# Patient Record
Sex: Male | Born: 1970 | Race: Asian | Hispanic: No | Marital: Married | Smoking: Former smoker
Health system: Southern US, Community
[De-identification: ages and names within clinical notes are randomized; demographics above are authoritative.]

## PROBLEM LIST (undated history)

## (undated) DIAGNOSIS — R7301 Impaired fasting glucose: Secondary | ICD-10-CM

## (undated) DIAGNOSIS — Z8049 Family history of malignant neoplasm of other genital organs: Secondary | ICD-10-CM

## (undated) DIAGNOSIS — IMO0001 Reserved for inherently not codable concepts without codable children: Secondary | ICD-10-CM

## (undated) DIAGNOSIS — K219 Gastro-esophageal reflux disease without esophagitis: Secondary | ICD-10-CM

## (undated) DIAGNOSIS — Z5189 Encounter for other specified aftercare: Secondary | ICD-10-CM

## (undated) DIAGNOSIS — Z800 Family history of malignant neoplasm of digestive organs: Secondary | ICD-10-CM

## (undated) DIAGNOSIS — E119 Type 2 diabetes mellitus without complications: Secondary | ICD-10-CM

## (undated) DIAGNOSIS — B181 Chronic viral hepatitis B without delta-agent: Secondary | ICD-10-CM

## (undated) DIAGNOSIS — I10 Essential (primary) hypertension: Secondary | ICD-10-CM

## (undated) DIAGNOSIS — C178 Malignant neoplasm of overlapping sites of small intestine: Secondary | ICD-10-CM

## (undated) DIAGNOSIS — R918 Other nonspecific abnormal finding of lung field: Secondary | ICD-10-CM

## (undated) DIAGNOSIS — I509 Heart failure, unspecified: Secondary | ICD-10-CM

## (undated) DIAGNOSIS — D696 Thrombocytopenia, unspecified: Secondary | ICD-10-CM

## (undated) DIAGNOSIS — N1330 Unspecified hydronephrosis: Secondary | ICD-10-CM

## (undated) DIAGNOSIS — IMO0002 Reserved for concepts with insufficient information to code with codable children: Secondary | ICD-10-CM

## (undated) HISTORY — DX: Malignant neoplasm of overlapping sites of small intestine: C17.8

## (undated) HISTORY — DX: Family history of malignant neoplasm of digestive organs: Z80.0

## (undated) HISTORY — DX: Reserved for concepts with insufficient information to code with codable children: IMO0002

## (undated) HISTORY — DX: Type 2 diabetes mellitus without complications: E11.9

## (undated) HISTORY — DX: Impaired fasting glucose: R73.01

## (undated) HISTORY — DX: Essential (primary) hypertension: I10

## (undated) HISTORY — DX: Gastro-esophageal reflux disease without esophagitis: K21.9

## (undated) HISTORY — DX: Chronic viral hepatitis B without delta-agent: B18.1

## (undated) HISTORY — DX: Encounter for other specified aftercare: Z51.89

## (undated) HISTORY — DX: Family history of malignant neoplasm of other genital organs: Z80.49

## (undated) HISTORY — DX: Heart failure, unspecified: I50.9

## (undated) HISTORY — DX: Reserved for inherently not codable concepts without codable children: IMO0001

## (undated) HISTORY — PX: BACK SURGERY: SHX140

## (undated) HISTORY — PX: UPPER GI ENDOSCOPY: SHX6162

---

## 1995-09-08 HISTORY — PX: APPENDECTOMY: SHX54

## 2012-11-30 ENCOUNTER — Other Ambulatory Visit: Payer: Self-pay | Admitting: *Deleted

## 2012-11-30 ENCOUNTER — Other Ambulatory Visit: Payer: No Typology Code available for payment source

## 2012-11-30 ENCOUNTER — Other Ambulatory Visit (INDEPENDENT_AMBULATORY_CARE_PROVIDER_SITE_OTHER): Payer: No Typology Code available for payment source

## 2012-11-30 DIAGNOSIS — E785 Hyperlipidemia, unspecified: Secondary | ICD-10-CM

## 2012-11-30 DIAGNOSIS — R7301 Impaired fasting glucose: Secondary | ICD-10-CM

## 2012-11-30 DIAGNOSIS — R5381 Other malaise: Secondary | ICD-10-CM

## 2012-11-30 DIAGNOSIS — E559 Vitamin D deficiency, unspecified: Secondary | ICD-10-CM

## 2012-11-30 DIAGNOSIS — I10 Essential (primary) hypertension: Secondary | ICD-10-CM

## 2012-11-30 DIAGNOSIS — B181 Chronic viral hepatitis B without delta-agent: Secondary | ICD-10-CM

## 2012-11-30 LAB — COMPLETE METABOLIC PANEL WITH GFR
ALT: 19 U/L (ref 0–53)
AST: 17 U/L (ref 0–37)
Albumin: 4.2 g/dL (ref 3.5–5.2)
Alkaline Phosphatase: 77 U/L (ref 39–117)
BUN: 13 mg/dL (ref 6–23)
CO2: 28 mEq/L (ref 19–32)
Calcium: 9.3 mg/dL (ref 8.4–10.5)
Chloride: 109 mEq/L (ref 96–112)
Creat: 1.14 mg/dL (ref 0.50–1.35)
GFR, Est African American: >89 mL/min
GFR, Est Non African American: 78 mL/min
Glucose, Bld: 88 mg/dL (ref 70–99)
Potassium: 4.3 mEq/L (ref 3.5–5.3)
Sodium: 144 mEq/L (ref 135–145)
Total Bilirubin: 0.9 mg/dL (ref 0.3–1.2)
Total Protein: 6.5 g/dL (ref 6.0–8.3)

## 2012-11-30 LAB — LIPID PANEL
Cholesterol: 136 mg/dL (ref 0–200)
HDL: 41 mg/dL (ref >39)
LDL Cholesterol: 74 mg/dL (ref 0–99)
Total CHOL/HDL Ratio: 3.3 Ratio
Triglycerides: 107 mg/dL (ref <150)
VLDL: 21 mg/dL (ref 0–40)

## 2012-11-30 LAB — TSH: TSH: 1.435 u[IU]/mL (ref 0.350–4.500)

## 2012-12-01 LAB — CBC WITH DIFFERENTIAL/PLATELET
Basophils Absolute: 0 10*3/uL (ref 0.0–0.1)
Basophils Relative: 0 % (ref 0–1)
Eosinophils Absolute: 0.3 10*3/uL (ref 0.0–0.7)
Eosinophils Relative: 5 % (ref 0–5)
HCT: 43.4 % (ref 39.0–52.0)
Hemoglobin: 14.9 g/dL (ref 13.0–17.0)
Lymphocytes Relative: 29 % (ref 12–46)
Lymphs Abs: 1.5 10*3/uL (ref 0.7–4.0)
MCH: 29 pg (ref 26.0–34.0)
MCHC: 34.3 g/dL (ref 30.0–36.0)
MCV: 84.6 fL (ref 78.0–100.0)
Monocytes Absolute: 0.3 10*3/uL (ref 0.1–1.0)
Monocytes Relative: 6 % (ref 3–12)
Neutro Abs: 3.1 10*3/uL (ref 1.7–7.7)
Neutrophils Relative %: 60 % (ref 43–77)
Platelets: 162 10*3/uL (ref 150–400)
RBC: 5.13 MIL/uL (ref 4.22–5.81)
RDW: 14.3 % (ref 11.5–15.5)
WBC: 5.2 10*3/uL (ref 4.0–10.5)

## 2012-12-01 LAB — HEMOGLOBIN A1C
Hgb A1c MFr Bld: 5.6 % (ref <5.7)
Mean Plasma Glucose: 114 mg/dL (ref <117)

## 2012-12-01 LAB — VITAMIN D 25 HYDROXY (VIT D DEFICIENCY, FRACTURES): Vit D, 25-Hydroxy: 11 ng/mL — ABNORMAL LOW (ref 30–89)

## 2012-12-07 ENCOUNTER — Encounter: Payer: Self-pay | Admitting: Family Medicine

## 2012-12-07 ENCOUNTER — Ambulatory Visit (INDEPENDENT_AMBULATORY_CARE_PROVIDER_SITE_OTHER): Payer: No Typology Code available for payment source | Admitting: Family Medicine

## 2012-12-07 VITALS — BP 152/101 | HR 62 | Ht 66.5 in | Wt 147.0 lb

## 2012-12-07 DIAGNOSIS — L723 Sebaceous cyst: Secondary | ICD-10-CM

## 2012-12-07 DIAGNOSIS — K219 Gastro-esophageal reflux disease without esophagitis: Secondary | ICD-10-CM | POA: Insufficient documentation

## 2012-12-07 DIAGNOSIS — Z0000 Encounter for general adult medical examination without abnormal findings: Secondary | ICD-10-CM

## 2012-12-07 DIAGNOSIS — Z23 Encounter for immunization: Secondary | ICD-10-CM

## 2012-12-07 DIAGNOSIS — I10 Essential (primary) hypertension: Secondary | ICD-10-CM | POA: Insufficient documentation

## 2012-12-07 LAB — POCT URINALYSIS DIPSTICK
Bilirubin, UA: NEGATIVE
Blood, UA: NEGATIVE
Glucose, UA: NEGATIVE
Ketones, UA: NEGATIVE
Leukocytes, UA: NEGATIVE
Nitrite, UA: NEGATIVE
Protein, UA: NEGATIVE
Spec Grav, UA: 1.025
Urobilinogen, UA: NEGATIVE
pH, UA: 5
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
## 2012-12-07 NOTE — Patient Instructions (Addendum)
Health Maintenance, Males A healthy lifestyle and preventative care can promote health and wellness.  Maintain regular health, dental, and eye exams.  Eat a healthy diet. Foods like vegetables, fruits, whole grains, low-fat dairy products, and lean protein foods contain the nutrients you need without too many calories. Decrease your intake of foods high in solid fats, added sugars, and salt. Get information about a proper diet from your caregiver, if necessary.  Regular physical exercise is one of the most important things you can do for your health. Most adults should get at least 150 minutes of moderate-intensity exercise (any activity that increases your heart rate and causes you to sweat) each week. In addition, most adults need muscle-strengthening exercises on 2 or more days a week.   Maintain a healthy weight. The body mass index (BMI) is a screening tool to identify possible weight problems. It provides an estimate of body fat based on height and weight. Your caregiver can help determine your BMI, and can help you achieve or maintain a healthy weight. For adults 20 years and older:  A BMI below 18.5 is considered underweight.  A BMI of 18.5 to 24.9 is normal.  A BMI of 25 to 29.9 is considered overweight.  A BMI of 30 and above is considered obese.  Maintain normal blood lipids and cholesterol by exercising and minimizing your intake of saturated fat. Eat a balanced diet with plenty of fruits and vegetables. Blood tests for lipids and cholesterol should begin at age 20 and be repeated every 5 years. If your lipid or cholesterol levels are high, you are over 50, or you are a high risk for heart disease, you may need your cholesterol levels checked more frequently.Ongoing high lipid and cholesterol levels should be treated with medicines, if diet and exercise are not effective.  If you smoke, find out from your caregiver how to quit. If you do not use tobacco, do not start.  If you  choose to drink alcohol, do not exceed 2 drinks per day. One drink is considered to be 12 ounces (355 mL) of beer, 5 ounces (148 mL) of wine, or 1.5 ounces (44 mL) of liquor.  Avoid use of street drugs. Do not share needles with anyone. Ask for help if you need support or instructions about stopping the use of drugs.  High blood pressure causes heart disease and increases the risk of stroke. Blood pressure should be checked at least every 1 to 2 years. Ongoing high blood pressure should be treated with medicines if weight loss and exercise are not effective.  If you are 45 to 44 years old, ask your caregiver if you should take aspirin to prevent heart disease.  Diabetes screening involves taking a blood sample to check your fasting blood sugar level. This should be done once every 3 years, after age 45, if you are within normal weight and without risk factors for diabetes. Testing should be considered at a younger age or be carried out more frequently if you are overweight and have at least 1 risk factor for diabetes.  Colorectal cancer can be detected and often prevented. Most routine colorectal cancer screening begins at the age of 50 and continues through age 75. However, your caregiver may recommend screening at an earlier age if you have risk factors for colon cancer. On a yearly basis, your caregiver may provide home test kits to check for hidden blood in the stool. Use of a small camera at the end of a tube,   to directly examine the colon (sigmoidoscopy or colonoscopy), can detect the earliest forms of colorectal cancer. Talk to your caregiver about this at age 42, when routine screening begins. Direct examination of the colon should be repeated every 5 to 10 years through age 13, unless early forms of pre-cancerous polyps or small growths are found.  Hepatitis C blood testing is recommended for all people born from [birth year] through 1967 and any individual with known risks for hepatitis C.  Healthy  men should no longer receive prostate-specific antigen (PSA) blood tests as part of routine cancer screening. Consult with your caregiver about prostate cancer screening.  Testicular cancer screening is not recommended for adolescents or adult males who have no symptoms. Screening includes self-exam, caregiver exam, and other screening tests. Consult with your caregiver about any symptoms you have or any concerns you have about testicular cancer.  Practice safe sex. Use condoms and avoid high-risk sexual practices to reduce the spread of sexually transmitted infections (STIs).  Use sunscreen with a sun protection factor (SPF) of 30 or greater. Apply sunscreen liberally and repeatedly throughout the day. You should seek shade when your shadow is shorter than you. Protect yourself by wearing long sleeves, pants, a wide-brimmed hat, and sunglasses year round, whenever you are outdoors.  Notify your caregiver of new moles or changes in moles, especially if there is a change in shape or color. Also notify your caregiver if a mole is larger than the size of a pencil eraser.  A one-time screening for abdominal aortic aneurysm (AAA) and surgical repair of large AAAs by sound wave imaging (ultrasonography) is recommended for ages 16 to 20 years who are current or former smokers.  Stay current with your immunizations. Document Released: 02/20/2008 Document Revised: 11/16/2011 Document Reviewed: 01/19/2011 [facility] Patient Information 2013 [place], [place]. Smoking Cessation Quitting smoking is important to your health and has many advantages. However, it is not always easy to quit since nicotine is a very addictive drug. Often times, people try 3 times or more before being able to quit. This document explains the best ways for you to prepare to quit smoking. Quitting takes hard work and a lot of effort, but you can do it. ADVANTAGES OF QUITTING SMOKING  You will live longer, feel better, and live  better.  Your body will feel the impact of quitting smoking almost immediately.  Within 20 minutes, blood pressure decreases. Your pulse returns to its normal level.  After 8 hours, carbon monoxide levels in the blood return to normal. Your oxygen level increases.  After 24 hours, the chance of having a heart attack starts to decrease. Your breath, hair, and body stop smelling like smoke.  After 48 hours, damaged nerve endings begin to recover. Your sense of taste and smell improve.  After 72 hours, the body is virtually free of nicotine. Your bronchial tubes relax and breathing becomes easier.  After 2 to 12 weeks, lungs can hold more air. Exercise becomes easier and circulation improves.  The risk of having a heart attack, stroke, cancer, or lung disease is greatly reduced.  After 1 year, the risk of coronary heart disease is cut in half.  After 5 years, the risk of stroke falls to the same as a nonsmoker.  After 10 years, the risk of lung cancer is cut in half and the risk of other cancers decreases significantly.  After 15 years, the risk of coronary heart disease drops, usually to the level of a nonsmoker.  If you  are pregnant, quitting smoking will improve your chances of having a healthy baby.  The people you live with, especially any children, will be healthier.  You will have extra money to spend on things other than cigarettes. QUESTIONS TO THINK ABOUT BEFORE ATTEMPTING TO QUIT You may want to talk about your answers with your caregiver.  Why do you want to quit?  If you tried to quit in the past, what helped and what did not?  What will be the most difficult situations for you after you quit? How will you plan to handle them?  Who can help you through the tough times? Your family? Friends? A caregiver?  What pleasures do you get from smoking? What ways can you still get pleasure if you quit? Here are some questions to ask your caregiver:  How can you help me to  be successful at quitting?  What medicine do you think would be best for me and how should I take it?  What should I do if I need more help?  What is smoking withdrawal like? How can I get information on withdrawal? GET READY  Set a quit date.  Change your environment by getting rid of all cigarettes, ashtrays, matches, and lighters in your home, car, or work. Do not let people smoke in your home.  Review your past attempts to quit. Think about what worked and what did not. GET SUPPORT AND ENCOURAGEMENT You have a better chance of being successful if you have help. You can get support in many ways.  Tell your family, friends, and co-workers that you are going to quit and need their support. Ask them not to smoke around you.  Get individual, group, or telephone counseling and support. Programs are available at [employer] and health centers. Call your local health department for information about programs in your area.  Spiritual beliefs and practices may help some smokers quit.  Download a "quit meter" on your computer to keep track of quit statistics, such as how long you have gone without smoking, cigarettes not smoked, and money saved.  Get a self-help book about quitting smoking and staying off of tobacco. LEARN NEW SKILLS AND BEHAVIORS  Distract yourself from urges to smoke. Talk to someone, go for a walk, or occupy your time with a task.  Change your normal routine. Take a different route to work. Drink tea instead of coffee. Eat breakfast in a different place.  Reduce your stress. Take a hot bath, exercise, or read a book.  Plan something enjoyable to do every day. Reward yourself for not smoking.  Explore interactive web-based programs that specialize in helping you quit. GET MEDICINE AND USE IT CORRECTLY Medicines can help you stop smoking and decrease the urge to smoke. Combining medicine with the above behavioral methods and support can greatly increase your  chances of successfully quitting smoking.  Nicotine replacement therapy helps deliver nicotine to your body without the negative effects and risks of smoking. Nicotine replacement therapy includes nicotine gum, lozenges, inhalers, nasal sprays, and skin patches. Some may be available over-the-counter and others require a prescription.  Antidepressant medicine helps people abstain from smoking, but how this works is unknown. This medicine is available by prescription.  Nicotinic receptor partial agonist medicine simulates the effect of nicotine in your brain. This medicine is available by prescription. Ask your caregiver for advice about which medicines to use and how to use them based on your health history. Your caregiver will tell you what side  effects to look out for if you choose to be on a medicine or therapy. Carefully read the information on the package. Do not use any other product containing nicotine while using a nicotine replacement product.  RELAPSE OR DIFFICULT SITUATIONS Most relapses occur within the first 3 months after quitting. Do not be discouraged if you start smoking again. Remember, most people try several times before finally quitting. You may have symptoms of withdrawal because your body is used to nicotine. You may crave cigarettes, be irritable, feel very hungry, cough often, get headaches, or have difficulty concentrating. The withdrawal symptoms are only temporary. They are strongest when you first quit, but they will go away within 10 14 days. To reduce the chances of relapse, try to:  Avoid drinking alcohol. Drinking lowers your chances of successfully quitting.  Reduce the amount of caffeine you consume. Once you quit smoking, the amount of caffeine in your body increases and can give you symptoms, such as a rapid heartbeat, sweating, and anxiety.  Avoid smokers because they can make you want to smoke.  Do not let weight gain distract you. Many smokers will gain  weight when they quit, usually less than 10 pounds. Eat a healthy diet and stay active. You can always lose the weight gained after you quit.  Find ways to improve your mood other than smoking. FOR MORE INFORMATION  www.smokefree.gov  Document Released: 08/18/2001 Document Revised: 02/23/2012 Document Reviewed: 12/03/2011 [facility] Patient Information 2013 [place], [place].

## 2012-12-26 ENCOUNTER — Encounter: Payer: Self-pay | Admitting: *Deleted

## 2013-02-15 ENCOUNTER — Other Ambulatory Visit: Payer: Self-pay | Admitting: Gastroenterology

## 2013-02-15 DIAGNOSIS — R109 Unspecified abdominal pain: Secondary | ICD-10-CM

## 2013-02-15 DIAGNOSIS — R634 Abnormal weight loss: Secondary | ICD-10-CM

## 2013-02-20 ENCOUNTER — Ambulatory Visit
Admission: RE | Admit: 2013-02-20 | Discharge: 2013-02-20 | Disposition: A | Discharge status: Home / Self Care | Payer: No Typology Code available for payment source | Source: Ambulatory Visit | Attending: Gastroenterology | Admitting: Gastroenterology

## 2013-02-20 DIAGNOSIS — R634 Abnormal weight loss: Secondary | ICD-10-CM

## 2013-02-20 DIAGNOSIS — R109 Unspecified abdominal pain: Secondary | ICD-10-CM

## 2013-03-09 ENCOUNTER — Encounter: Payer: Self-pay | Admitting: *Deleted

## 2013-03-24 ENCOUNTER — Other Ambulatory Visit: Payer: Self-pay | Admitting: Gastroenterology

## 2013-03-24 DIAGNOSIS — R109 Unspecified abdominal pain: Secondary | ICD-10-CM

## 2013-03-28 ENCOUNTER — Ambulatory Visit
Admission: RE | Admit: 2013-03-28 | Discharge: 2013-03-28 | Disposition: A | Discharge status: Home / Self Care | Payer: No Typology Code available for payment source | Source: Ambulatory Visit | Attending: Gastroenterology | Admitting: Gastroenterology

## 2013-03-28 DIAGNOSIS — R109 Unspecified abdominal pain: Secondary | ICD-10-CM

## 2013-03-28 MED ORDER — IOHEXOL 350 MG/ML SOLN: 100.0000 mL | Freq: Once | INTRAVENOUS | Status: AC | PRN

## 2013-09-07 HISTORY — PX: COLONOSCOPY: SHX174

## 2013-09-19 HISTORY — PX: ABDOMINAL EXPLORATION SURGERY: SHX538

## 2013-09-19 HISTORY — PX: PARTIAL GASTRECTOMY: SHX2172

## 2013-09-19 HISTORY — PX: PANCREATICODUODENECTOMY: SUR1000

## 2013-10-25 ENCOUNTER — Telehealth: Payer: Self-pay | Admitting: Hematology & Oncology

## 2013-10-25 NOTE — Telephone Encounter (Signed)
Left vm w NEW PATIENT today to remind them of their appointment with Dr. Ennever. Also, advised them to bring all meds and insurance information. ° °

## 2013-10-26 ENCOUNTER — Other Ambulatory Visit (HOSPITAL_BASED_OUTPATIENT_CLINIC_OR_DEPARTMENT_OTHER): Payer: No Typology Code available for payment source | Admitting: Lab

## 2013-10-26 ENCOUNTER — Encounter: Payer: Self-pay | Admitting: Hematology & Oncology

## 2013-10-26 ENCOUNTER — Ambulatory Visit: Payer: PRIVATE HEALTH INSURANCE

## 2013-10-26 ENCOUNTER — Ambulatory Visit (HOSPITAL_BASED_OUTPATIENT_CLINIC_OR_DEPARTMENT_OTHER): Payer: No Typology Code available for payment source | Admitting: Hematology & Oncology

## 2013-10-26 VITALS — BP 124/89 | HR 84 | Temp 98.2°F | Resp 18 | Ht 67.0 in | Wt 116.0 lb

## 2013-10-26 DIAGNOSIS — C178 Malignant neoplasm of overlapping sites of small intestine: Secondary | ICD-10-CM

## 2013-10-26 DIAGNOSIS — M79609 Pain in unspecified limb: Secondary | ICD-10-CM

## 2013-10-26 DIAGNOSIS — C170 Malignant neoplasm of duodenum: Secondary | ICD-10-CM

## 2013-10-26 HISTORY — DX: Malignant neoplasm of overlapping sites of small intestine: C17.8

## 2013-10-26 LAB — CBC WITH DIFFERENTIAL (CANCER CENTER ONLY)
BASO#: 0 10*3/uL (ref 0.0–0.2)
BASO%: 0.8 % (ref 0.0–2.0)
EOS%: 2.9 % (ref 0.0–7.0)
Eosinophils Absolute: 0.1 10*3/uL (ref 0.0–0.5)
HEMATOCRIT: 36.6 % — AB (ref 38.7–49.9)
HEMOGLOBIN: 11.6 g/dL — AB (ref 13.0–17.1)
LYMPH#: 1 10*3/uL (ref 0.9–3.3)
LYMPH%: 25.1 % (ref 14.0–48.0)
MCH: 27 pg — ABNORMAL LOW (ref 28.0–33.4)
MCHC: 31.7 g/dL — ABNORMAL LOW (ref 32.0–35.9)
MCV: 85 fL (ref 82–98)
MONO#: 0.3 10*3/uL (ref 0.1–0.9)
MONO%: 7.3 % (ref 0.0–13.0)
NEUT#: 2.4 10*3/uL (ref 1.5–6.5)
NEUT%: 63.9 % (ref 40.0–80.0)
Platelets: 231 10*3/uL (ref 145–400)
RBC: 4.29 10*6/uL (ref 4.20–5.70)
RDW: 14.9 % (ref 11.1–15.7)
WBC: 3.8 10*3/uL — AB (ref 4.0–10.0)

## 2013-10-26 LAB — COMPREHENSIVE METABOLIC PANEL
ALK PHOS: 765 U/L — AB (ref 39–117)
ALT: 263 U/L — ABNORMAL HIGH (ref 0–53)
AST: 178 U/L — AB (ref 0–37)
Albumin: 3.5 g/dL (ref 3.5–5.2)
BILIRUBIN TOTAL: 1.6 mg/dL — AB (ref 0.2–1.2)
BUN: 15 mg/dL (ref 6–23)
CO2: 28 mEq/L (ref 19–32)
Calcium: 8.8 mg/dL (ref 8.4–10.5)
Chloride: 102 mEq/L (ref 96–112)
Creatinine, Ser: 0.76 mg/dL (ref 0.50–1.35)
GLUCOSE: 115 mg/dL — AB (ref 70–99)
Potassium: 4.5 mEq/L (ref 3.5–5.3)
Sodium: 139 mEq/L (ref 135–145)
Total Protein: 5.9 g/dL — ABNORMAL LOW (ref 6.0–8.3)

## 2013-10-26 LAB — LACTATE DEHYDROGENASE: LDH: 184 U/L (ref 94–250)

## 2013-10-26 LAB — PREALBUMIN: Prealbumin: 16.6 mg/dL — ABNORMAL LOW (ref 17.0–34.0)

## 2013-10-26 LAB — CEA: CEA: 2.1 ng/mL (ref 0.0–5.0)

## 2013-10-26 MED ORDER — MEGESTROL ACETATE 625 MG/5ML PO SUSP: 625.0000 mg | Freq: Every day | ORAL | Status: DC

## 2013-10-26 MED ORDER — OXANDROLONE 10 MG PO TABS: ORAL_TABLET | ORAL | Status: DC

## 2013-10-26 MED ORDER — DRONABINOL 2.5 MG PO CAPS: 2.5000 mg | ORAL_CAPSULE | Freq: Two times a day (BID) | ORAL | Status: DC

## 2013-10-26 NOTE — Progress Notes (Signed)
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 He has no past medical history. He has been in good health up until about December. He began to lose weight. His have is no abdominal pain. He was initially evaluated appear in [place].  Actually, back in July, he had some issues. He had a CT scan done.  This showed no obvious pancreatic lesions. There is a mild. Duodenal adenopathy.     Had endoscopes. I'm not sure where he had these. He says these were all negative.  He went down to [place]. He ultimately was taken for laparotomy. Unfortunately, he was found to have a duodenal mass. Initially he was taken for an the possibility of a vagotomy and pyloroplasty. However, once his diet he had a duodenal mass, he was seen by surgical oncology. He had a total resection of the mass. The mass at 4 slighted invaded the pancreas. He essentially had a Whipple type procedure. No obvious metastatic disease was noted.  The pathology report from Keith [name] revealed a adenocarcinoma. There was 3 cm. It was invading the pancreas. He had 9 lymph nodes involved. 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 He was referred back up to our area for adjuvant therapy.  His surgery was on January 13.  He is going to the bathroom okay. He has some abdominal pain. He does not have much of an appetite. There is no vomiting. He has some nausea. This no cough. He does feel weak. There is no bleeding.  Overall, his performance status is ECoG  1.   Chief Complaint  Patient presents with  . NEW PATIENT    Past Medical History  Diagnosis Date  . Chronic hepatitis B   . Impaired fasting glucose   . Hypertension   . GERD (gastroesophageal reflux disease)   . Cancer of small intestine, including duodenum 10/26/2013  :  Past Surgical History  Procedure Laterality Date  . Appendectomy  1997  :  Current  outpatient prescriptions:acetaminophen (TYLENOL) 325 MG tablet, Take 325 mg by mouth as needed., Disp: , Rfl: ;  dronabinol (MARINOL) 2.5 MG capsule, Take 1 capsule (2.5 mg total) by mouth 2 (two) times daily before a meal., Disp: 60 capsule, Rfl: 2;  megestrol (MEGACE ES) 625 MG/5ML suspension, Take 5 mLs (625 mg total) by mouth daily., Disp: 150 mL, Rfl: 0;  oxandrolone (OXANDRIN) 10 MG tablet, Take 1 tablet daily, Disp: 30 tablet, Rfl: 3:  :  No Known Allergies:  Family History  Problem Relation Age of Onset  . Hypertension    . Colon cancer Sister 19  . Esophageal cancer Father 46  . Cancer Mother     Ureter Cancer  :  History   Social History  . Marital Status: Married    Spouse Name: Keith [name]     Number of Children: 2  . Years of Education: 16   Occupational History  . TECHNICAL SUPPORT      [employer]    Social History Main Topics  . Smoking status: Former Smoker -- 0.25 packs/day for 25 years    Types: Cigarettes    Start date: 09/07/1986    Quit date: 09/13/2013  . Smokeless tobacco: Never Used     Comment: quit 4 weeks ago 09-2013  . Alcohol Use: Yes     Comment: He drinks rarely   . Drug Use: No  .  Sexual Activity: Yes    Birth Control/ Protection: Surgical     Comment: Wife had a BTL   Other Topics Concern  . 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  He has smoked at least 20 years.     Alcohol Use:  Rarely    Exercise:  Limited    Hobbies: Music           :  Pertinent items are noted in HPI.  Exam: @[employer] @ [employer] [place]  gentleman in no obvious distress. Head and neck exam shows no ocular or oral lesions. There is no adenopathy in the neck.  Lungs are clear. Cardiac exam regular in rhythm  with no murmurs rubs or bruits. Abdomen is soft. Has a laparotomy scar that is well-healed. There is no fluid wave. There is no palpable hepato- splenomegaly back exam no tenderness over the spine ribs or hips. Extremities some muscle atrophy in upper lower extremities. may be some slight swelling in the left arm. Skin exam slightly dry.  Neurological exam shows no focal neurological deficits.   Recent Labs  10/26/13 1528  WBC 3.8*  HGB 11.6*  HCT 36.6*  PLT 231   No results found for this basename: NA, K, CL, CO2, GLUCOSE, BUN, CREATININE, CALCIUM,  in the last 72 hours  Blood smear review: none  Pathology:as above    Assessment and Plan: [name] is a 43 year old [place] gentleman. He has, clearly, locally advanced adenocarcinoma of the duodenum. This is a unusual tumor. He underwent a very aggressive resection. All tumor was resected as far as I can tell. He had 9 positive lymph nodes.  He has a very high risk of recurrence. Out say that his risk of recurrence without therapy and is easily about 70-80%. Even with aggressive therapy, risk of recurrence is still probably only going to be 25 have a 30%.  Before he got sick, he was doing well. He is very functional.  I think that we will have to do both chemotherapy and radiation therapy.  I would treat him with upfront chemotherapy with FOLFOX followed by low-dose Xeloda with radiation followed by full dose chemotherapy with FOLFOX.  I would use 4 cycles of chemotherapy then go with chemotherapy XRT then follow this with 6 cycles of full dose chemotherapy.  I spent an hour half with [name] in his wife. I explained to him what I thought was needed. I explained to them why I thought he needed aggressive therapy.  I went over the side effects with treatment.  He does have some pain in the left arm. I want to get a Doppler measure there is no thrombus in the left arm.  We will also  set up a baseline CT scan for him.  We will try to start therapy in 1-2 weeks.  I will plan to see him back myself when he starts his second cycle of chemotherapy.

## 2013-10-26 NOTE — Patient Instructions (Addendum)
You Can Quit Smoking If you are ready to quit smoking or are thinking about it, congratulations! You have chosen to help yourself be healthier and live longer! There are lots of different ways to quit smoking. Nicotine gum, nicotine patches, a nicotine inhaler, or nicotine nasal spray can help with physical craving. Hypnosis, support groups, and medicines help break the habit of smoking. TIPS TO GET OFF AND STAY OFF CIGARETTES  Learn to predict your moods. Do not let a bad situation be your excuse to have a cigarette. Some situations in your life might tempt you to have a cigarette.  Ask friends and co-workers not to smoke around you.  Make your home smoke-free.  Never have "just one" cigarette. It leads to wanting another and another. Remind yourself of your decision to quit.  On a card, make a list of your reasons for not smoking. Read it at least the same number of times a day as you have a cigarette. Tell yourself everyday, "I do not want to smoke. I choose not to smoke."  Ask someone at home or work to help you with your plan to quit smoking.  Have something planned after you eat or have a cup of coffee. Take a walk or get other exercise to perk you up. This will help to keep you from overeating.  Try a relaxation exercise to calm you down and decrease your stress. Remember, you may be tense and nervous the first two weeks after you quit. This will pass.  Find new activities to keep your hands busy. Play with a pen, coin, or rubber band. Doodle or draw things on paper.  Brush your teeth right after eating. This will help cut down the craving for the taste of tobacco after meals. You can try mouthwash too.  Try gum, breath mints, or diet candy to keep something in your mouth. IF YOU SMOKE AND WANT TO QUIT:  Do not stock up on cigarettes. Never buy a carton. Wait until one pack is finished before you buy another.  Never carry cigarettes with you at work or at home.  Keep cigarettes  as far away from you as possible. Leave them with someone else.  Never carry matches or a lighter with you.  Ask yourself, "Do I need this cigarette or is this just a reflex?"  Bet with someone that you can quit. Put cigarette money in a piggy bank every morning. If you smoke, you give up the money. If you do not smoke, by the end of the week, you keep the money.  Keep trying. It takes 21 days to change a habit!  Talk to your doctor about using medicines to help you quit. These include nicotine replacement gum, lozenges, or skin patches. Document Released: 06/20/2009 Document Revised: 11/16/2011 Document Reviewed: 06/20/2009 [facility] Patient Information 2014 [place].  Oxaliplatin Injection What is this medicine? OXALIPLATIN (ox AL i PLA tin) is a chemotherapy drug. It targets fast dividing cells, like cancer cells, and causes these cells to die. This medicine is used to treat cancers of the colon and rectum, and many other cancers. This medicine may be used for other purposes; ask your health care provider or pharmacist if you have questions. COMMON BRAND NAME(S): Eloxatin What should I tell my health care provider before I take this medicine? They need to know if you have any of these conditions: -kidney disease -an unusual or allergic reaction to oxaliplatin, other chemotherapy, other medicines, foods, dyes, or preservatives -pregnant or  trying to get pregnant -breast-feeding How should I use this medicine? This drug is given as an infusion into a vein. It is administered in a hospital or clinic by a specially trained health care professional. Talk to your pediatrician regarding the use of this medicine in children. Special care may be needed. Overdosage: If you think you have taken too much of this medicine contact a poison control center or emergency room at once. NOTE: This medicine is only for you. Do not share this medicine with others. What if I miss a dose? It is  important not to miss a dose. Call your doctor or health care professional if you are unable to keep an appointment. What may interact with this medicine? -medicines to increase blood counts like filgrastim, pegfilgrastim, sargramostim -probenecid -some antibiotics like amikacin, gentamicin, neomycin, polymyxin B, streptomycin, tobramycin -zalcitabine Talk to your doctor or health care professional before taking any of these medicines: -acetaminophen -aspirin -ibuprofen -ketoprofen -naproxen This list may not describe all possible interactions. Give your health care provider a list of all the medicines, herbs, non-prescription drugs, or dietary supplements you use. Also tell them if you smoke, drink alcohol, or use illegal drugs. Some items may interact with your medicine. What should I watch for while using this medicine? Your condition will be monitored carefully while you are receiving this medicine. You will need important blood work done while you are taking this medicine. This medicine can make you more sensitive to cold. Do not drink cold drinks or use ice. Cover exposed skin before coming in contact with cold temperatures or cold objects. When out in cold weather wear warm clothing and cover your mouth and nose to warm the air that goes into your lungs. Tell your doctor if you get sensitive to the cold. This drug may make you feel generally unwell. This is not uncommon, as chemotherapy can affect healthy cells as well as cancer cells. Report any side effects. Continue your course of treatment even though you feel ill unless your doctor tells you to stop. In some cases, you may be given additional medicines to help with side effects. Follow all directions for their use. Call your doctor or health care professional for advice if you get a fever, chills or sore throat, or other symptoms of a cold or flu. Do not treat yourself. This drug decreases your body's ability to fight infections. Try to  avoid being around people who are sick. This medicine may increase your risk to bruise or bleed. Call your doctor or health care professional if you notice any unusual bleeding. Be careful brushing and flossing your teeth or using a toothpick because you may get an infection or bleed more easily. If you have any dental work done, tell your dentist you are receiving this medicine. Avoid taking products that contain aspirin, acetaminophen, ibuprofen, naproxen, or ketoprofen unless instructed by your doctor. These medicines may hide a fever. Do not become pregnant while taking this medicine. Women should inform their doctor if they wish to become pregnant or think they might be pregnant. There is a potential for serious side effects to an unborn child. Talk to your health care professional or pharmacist for more information. Do not breast-feed an infant while taking this medicine. Call your doctor or health care professional if you get diarrhea. Do not treat yourself. What side effects may I notice from receiving this medicine? Side effects that you should report to your doctor or health care professional as soon as  possible: -allergic reactions like skin rash, itching or hives, swelling of the face, lips, or tongue -low blood counts - This drug may decrease the number of white blood cells, red blood cells and platelets. You may be at increased risk for infections and bleeding. -signs of infection - fever or chills, cough, sore throat, pain or difficulty passing urine -signs of decreased platelets or bleeding - bruising, pinpoint red spots on the skin, black, tarry stools, nosebleeds -signs of decreased red blood cells - unusually weak or tired, fainting spells, lightheadedness -breathing problems -chest pain, pressure -cough -diarrhea -jaw tightness -mouth sores -nausea and vomiting -pain, swelling, redness or irritation at the injection site -pain, tingling, numbness in the hands or  feet -problems with balance, talking, walking -redness, blistering, peeling or loosening of the skin, including inside the mouth -trouble passing urine or change in the amount of urine Side effects that usually do not require medical attention (report to your doctor or health care professional if they continue or are bothersome): -changes in vision -constipation -hair loss -loss of appetite -metallic taste in the mouth or changes in taste -stomach pain This list may not describe all possible side effects. Call your doctor for medical advice about side effects. You may report side effects to FDA at 1-800-FDA-1088. Where should I keep my medicine? This drug is given in a hospital or clinic and will not be stored at home. NOTE: This sheet is a summary. It may not cover all possible information. If you have questions about this medicine, talk to your doctor, pharmacist, or health care provider.  2014, [employer] Standard. (2008-03-20 17:22:47)   Capecitabine tablets What is this medicine? CAPECITABINE (ka pe SITE a been) is a chemotherapy drug. It slows the growth of cancer cells. This medicine is used to treat breast cancer, and also colon or rectal cancer. This medicine may be used for other purposes; ask your health care provider or pharmacist if you have questions. COMMON BRAND NAME(S): Xeloda What should I tell my health care provider before I take this medicine? They need to know if you have any of these conditions: -bleeding or blood disorders -dihydropyrimidine dehydrogenase (DPD) deficiency -heart disease -infection (especially a virus infection such as chickenpox, cold sores, or herpes) -kidney disease -liver disease -an unusual or allergic reaction to capecitabine, 5-fluorouracil, other medicines, foods, dyes, or preservatives -pregnant or trying to get pregnant -breast-feeding How should I use this medicine? Take this medicine by mouth with a glass of water, within 30  minutes of the end of a meal. Follow the directions on the prescription label. Take your medicine at regular intervals. Do not take it more often than directed. Do not stop taking except on your doctor's advice. Your doctor may want you to take a combination of 150 mg and 500 mg tablets for each dose. It is very important that you know how to correctly take your dose. Taking the wrong tablets could result in an overdose (too much medication) or underdose (too little medication). Talk to your pediatrician regarding the use of this medicine in children. Special care may be needed. Overdosage: If you think you have taken too much of this medicine contact a poison control center or emergency room at once. NOTE: This medicine is only for you. Do not share this medicine with others. What if I miss a dose? If you miss a dose, do not take the missed dose at all. Do not take double or extra doses. Instead, continue with your next  scheduled dose and check with your doctor. What may interact with this medicine? -antacids with aluminum and/or magnesium -folic acid -leucovorin -medicines to increase blood counts like filgrastim, pegfilgrastim, sargramostim -phenytoin -vaccines -warfarin Talk to your doctor or health care professional before taking any of these medicines: -acetaminophen -aspirin -ibuprofen -ketoprofen -naproxen This list may not describe all possible interactions. Give your health care provider a list of all the medicines, herbs, non-prescription drugs, or dietary supplements you use. Also tell them if you smoke, drink alcohol, or use illegal drugs. Some items may interact with your medicine. What should I watch for while using this medicine? Visit your doctor for checks on your progress. This drug may make you feel generally unwell. This is not uncommon, as chemotherapy can affect healthy cells as well as cancer cells. Report any side effects. Continue your course of treatment even though  you feel ill unless your doctor tells you to stop. In some cases, you may be given additional medicines to help with side effects. Follow all directions for their use. Call your doctor or health care professional for advice if you get a fever, chills or sore throat, or other symptoms of a cold or flu. Do not treat yourself. This drug decreases your body's ability to fight infections. Try to avoid being around people who are sick. This medicine may increase your risk to bruise or bleed. Call your doctor or health care professional if you notice any unusual bleeding. Be careful brushing and flossing your teeth or using a toothpick because you may get an infection or bleed more easily. If you have any dental work done, tell your dentist you are receiving this medicine. Avoid taking products that contain aspirin, acetaminophen, ibuprofen, naproxen, or ketoprofen unless instructed by your doctor. These medicines may hide a fever. Do not become pregnant while taking this medicine. Women should inform their doctor if they wish to become pregnant or think they might be pregnant. There is a potential for serious side effects to an unborn child. Talk to your health care professional or pharmacist for more information. Do not breast-feed an infant while taking this medicine. Men are advised not to father a child while taking this medicine. What side effects may I notice from receiving this medicine? Side effects that you should report to your doctor or health care professional as soon as possible: -allergic reactions like skin rash, itching or hives, swelling of the face, lips, or tongue -low blood counts - this medicine may decrease the number of white blood cells, red blood cells and platelets. You may be at increased risk for infections and bleeding. -signs of infection - fever or chills, cough, sore throat, pain or difficulty passing urine -signs of decreased platelets or bleeding - bruising, pinpoint red  spots on the skin, black, tarry stools, blood in the urine -signs of decreased red blood cells - unusually weak or tired, fainting spells, lightheadedness -breathing problems -changes in vision -chest pain -diarrhea of more than 4 bowel movements in one day or any diarrhea at night -mouth sores -nausea and vomiting -pain, swelling, redness at site where injected -pain, tingling, numbness in the hands or feet -redness, swelling, or sores on hands or feet -stomach pain -vomiting -yellow color of skin or eyes Side effects that usually do not require medical attention (report to your doctor or health care professional if they continue or are bothersome): -constipation -diarrhea -dry or itchy skin -hair loss -loss of appetite -nausea -weak or tired This list may  not describe all possible side effects. Call your doctor for medical advice about side effects. You may report side effects to FDA at 1-800-FDA-1088. Where should I keep my medicine? Keep out of the reach of children. Store at room temperature between 15 and 30 degrees C (59 and 86 degrees F). Keep container tightly closed. Throw away any unused medicine after the expiration date. NOTE: This sheet is a summary. It may not cover all possible information. If you have questions about this medicine, talk to your doctor, pharmacist, or health care provider.  2014, [employer]vier/[employer] Standard. (2008-08-06 11:47:41)

## 2013-10-27 ENCOUNTER — Other Ambulatory Visit: Payer: Self-pay | Admitting: *Deleted

## 2013-10-27 ENCOUNTER — Ambulatory Visit (HOSPITAL_BASED_OUTPATIENT_CLINIC_OR_DEPARTMENT_OTHER)
Admission: RE | Admit: 2013-10-27 | Discharge: 2013-10-27 | Disposition: A | Discharge status: Home / Self Care | Payer: PRIVATE HEALTH INSURANCE | Source: Ambulatory Visit | Attending: Hematology & Oncology | Admitting: Hematology & Oncology

## 2013-10-27 ENCOUNTER — Telehealth: Payer: Self-pay | Admitting: Hematology & Oncology

## 2013-10-27 DIAGNOSIS — C178 Malignant neoplasm of overlapping sites of small intestine: Secondary | ICD-10-CM

## 2013-10-27 DIAGNOSIS — M79609 Pain in unspecified limb: Secondary | ICD-10-CM | POA: Insufficient documentation

## 2013-10-27 DIAGNOSIS — C170 Malignant neoplasm of duodenum: Secondary | ICD-10-CM

## 2013-10-27 DIAGNOSIS — Z8711 Personal history of peptic ulcer disease: Secondary | ICD-10-CM | POA: Insufficient documentation

## 2013-10-27 MED ORDER — CAPECITABINE 500 MG PO TABS: 1000.0000 mg/m2 | ORAL_TABLET | Freq: Two times a day (BID) | ORAL | Status: DC

## 2013-10-27 MED ORDER — ONDANSETRON HCL 8 MG PO TABS: 8.0000 mg | ORAL_TABLET | Freq: Two times a day (BID) | ORAL | Status: DC

## 2013-10-27 MED ORDER — DEXAMETHASONE 4 MG PO TABS: 8.0000 mg | ORAL_TABLET | Freq: Two times a day (BID) | ORAL | Status: DC

## 2013-10-27 MED ORDER — PROCHLORPERAZINE MALEATE 10 MG PO TABS: 10.0000 mg | ORAL_TABLET | Freq: Four times a day (QID) | ORAL | Status: DC | PRN

## 2013-10-27 MED ORDER — LORAZEPAM 0.5 MG PO TABS: 0.5000 mg | ORAL_TABLET | Freq: Four times a day (QID) | ORAL | Status: DC | PRN

## 2013-10-27 NOTE — Telephone Encounter (Signed)
Orders released to [facility] to determine how much Xeloda will be for patient

## 2013-10-27 NOTE — Telephone Encounter (Signed)
Patient needs to use [name] according to [facility] due to insurance.  Phone # is [phone] and fax is [phone].  Information faxed to them to get prescription started

## 2013-10-27 NOTE — Telephone Encounter (Signed)
Pt aware of 2-24 CT and to come up here for schedule today

## 2013-10-30 ENCOUNTER — Telehealth: Payer: Self-pay | Admitting: Nurse Practitioner

## 2013-10-30 NOTE — Telephone Encounter (Addendum)
Message copied by Jimmy Footman on Mon Oct 30, 2013  9:34 AM ------      Message from: Burney Gauze R      Created: Sun Oct 29, 2013  9:15 AM       Call - no blood clot in left arm!  Pete ------LVM on pt's personal machine and instructed him to do not hesitate to contact our office if he had any further questions or concerns.

## 2013-10-31 ENCOUNTER — Encounter (HOSPITAL_BASED_OUTPATIENT_CLINIC_OR_DEPARTMENT_OTHER): Payer: Self-pay

## 2013-10-31 ENCOUNTER — Ambulatory Visit (HOSPITAL_BASED_OUTPATIENT_CLINIC_OR_DEPARTMENT_OTHER)
Admission: RE | Admit: 2013-10-31 | Discharge: 2013-10-31 | Disposition: A | Discharge status: Home / Self Care | Payer: PRIVATE HEALTH INSURANCE | Source: Ambulatory Visit | Attending: Hematology & Oncology | Admitting: Hematology & Oncology

## 2013-10-31 DIAGNOSIS — C178 Malignant neoplasm of overlapping sites of small intestine: Secondary | ICD-10-CM

## 2013-10-31 DIAGNOSIS — C170 Malignant neoplasm of duodenum: Secondary | ICD-10-CM

## 2013-10-31 DIAGNOSIS — Z8509 Personal history of malignant neoplasm of other digestive organs: Secondary | ICD-10-CM | POA: Insufficient documentation

## 2013-10-31 MED ORDER — IOHEXOL 300 MG/ML  SOLN
100.0000 mL | Freq: Once | INTRAMUSCULAR | Status: AC | PRN
  Administered 2013-10-31: 100 mL via INTRAVENOUS

## 2013-11-01 ENCOUNTER — Telehealth: Payer: Self-pay | Admitting: Hematology & Oncology

## 2013-11-01 ENCOUNTER — Other Ambulatory Visit: Payer: PRIVATE HEALTH INSURANCE

## 2013-11-01 NOTE — Telephone Encounter (Signed)
[place] w pt's [employer] crew for outpt chemo tx, per [employer].  oxaliplatin (ELOXATIN) 205 mg - [id number]   P: [phone] x [phone] F: [phone]

## 2013-11-02 ENCOUNTER — Other Ambulatory Visit: Payer: No Typology Code available for payment source

## 2013-11-06 ENCOUNTER — Other Ambulatory Visit (HOSPITAL_BASED_OUTPATIENT_CLINIC_OR_DEPARTMENT_OTHER): Payer: PRIVATE HEALTH INSURANCE | Admitting: Lab

## 2013-11-06 ENCOUNTER — Ambulatory Visit (HOSPITAL_BASED_OUTPATIENT_CLINIC_OR_DEPARTMENT_OTHER): Payer: No Typology Code available for payment source

## 2013-11-06 VITALS — BP 107/65 | HR 68 | Temp 97.4°F | Resp 18

## 2013-11-06 DIAGNOSIS — C178 Malignant neoplasm of overlapping sites of small intestine: Secondary | ICD-10-CM

## 2013-11-06 DIAGNOSIS — Z5111 Encounter for antineoplastic chemotherapy: Secondary | ICD-10-CM

## 2013-11-06 DIAGNOSIS — C170 Malignant neoplasm of duodenum: Secondary | ICD-10-CM

## 2013-11-06 LAB — CBC WITH DIFFERENTIAL (CANCER CENTER ONLY)
BASO#: 0 10*3/uL (ref 0.0–0.2)
BASO%: 0.2 % (ref 0.0–2.0)
EOS ABS: 0.1 10*3/uL (ref 0.0–0.5)
EOS%: 2.5 % (ref 0.0–7.0)
HCT: 32.5 % — ABNORMAL LOW (ref 38.7–49.9)
HEMOGLOBIN: 10.5 g/dL — AB (ref 13.0–17.1)
LYMPH#: 1.8 10*3/uL (ref 0.9–3.3)
LYMPH%: 31.3 % (ref 14.0–48.0)
MCH: 27 pg — ABNORMAL LOW (ref 28.0–33.4)
MCHC: 32.3 g/dL (ref 32.0–35.9)
MCV: 84 fL (ref 82–98)
MONO#: 0.5 10*3/uL (ref 0.1–0.9)
MONO%: 9.6 % (ref 0.0–13.0)
NEUT%: 56.4 % (ref 40.0–80.0)
NEUTROS ABS: 3.2 10*3/uL (ref 1.5–6.5)
Platelets: 204 10*3/uL (ref 145–400)
RBC: 3.89 10*6/uL — AB (ref 4.20–5.70)
RDW: 14.5 % (ref 11.1–15.7)
WBC: 5.6 10*3/uL (ref 4.0–10.0)

## 2013-11-06 LAB — CMP (CANCER CENTER ONLY)
ALBUMIN: 3 g/dL — AB (ref 3.3–5.5)
ALT(SGPT): 44 U/L (ref 10–47)
AST: 24 U/L (ref 11–38)
Alkaline Phosphatase: 261 U/L — ABNORMAL HIGH (ref 26–84)
BUN: 16 mg/dL (ref 7–22)
CALCIUM: 9 mg/dL (ref 8.0–10.3)
CHLORIDE: 107 meq/L (ref 98–108)
CO2: 29 mEq/L (ref 18–33)
Creat: 1 mg/dl (ref 0.6–1.2)
GLUCOSE: 143 mg/dL — AB (ref 73–118)
POTASSIUM: 4.3 meq/L (ref 3.3–4.7)
Sodium: 141 mEq/L (ref 128–145)
Total Bilirubin: 0.6 mg/dl (ref 0.20–1.60)
Total Protein: 6.4 g/dL (ref 6.4–8.1)

## 2013-11-06 MED ORDER — DEXAMETHASONE SODIUM PHOSPHATE 10 MG/ML IJ SOLN
INTRAMUSCULAR | Status: AC
  Filled 2013-11-06: qty 1

## 2013-11-06 MED ORDER — DEXAMETHASONE SODIUM PHOSPHATE 10 MG/ML IJ SOLN
10.0000 mg | Freq: Once | INTRAMUSCULAR | Status: AC
  Administered 2013-11-06: 10 mg via INTRAVENOUS

## 2013-11-06 MED ORDER — DEXTROSE 5 % IV SOLN
Freq: Once | INTRAVENOUS | Status: AC
  Administered 2013-11-06: 10:00:00 via INTRAVENOUS

## 2013-11-06 MED ORDER — DEXTROSE 5 % IV SOLN
125.0000 mg/m2 | Freq: Once | INTRAVENOUS | Status: AC
  Administered 2013-11-06: 200 mg via INTRAVENOUS
  Filled 2013-11-06: qty 40

## 2013-11-06 MED ORDER — ONDANSETRON 8 MG/50ML IVPB (CHCC)
8.0000 mg | Freq: Once | INTRAVENOUS | Status: AC
  Administered 2013-11-06: 8 mg via INTRAVENOUS

## 2013-11-06 NOTE — Patient Instructions (Signed)
[place] Discharge Instructions for Patients Receiving Chemotherapy  Today you received the following chemotherapy agents  Oxaliplatin  To help prevent nausea and vomiting after your treatment, we encourage you to take your nausea medication as prescribed    If you develop nausea and vomiting that is not controlled by your nausea medication, call the clinic. If it is after clinic hours your family physician or the after hours number for the clinic or go to the Emergency Department.   BELOW ARE SYMPTOMS THAT SHOULD BE REPORTED IMMEDIATELY:  *FEVER GREATER THAN 100.5 F  *CHILLS WITH OR WITHOUT FEVER  NAUSEA AND VOMITING THAT IS NOT CONTROLLED WITH YOUR NAUSEA MEDICATION  *UNUSUAL SHORTNESS OF BREATH  *UNUSUAL BRUISING OR BLEEDING  TENDERNESS IN MOUTH AND THROAT WITH OR WITHOUT PRESENCE OF ULCERS  *URINARY PROBLEMS  *BOWEL PROBLEMS  UNUSUAL RASH Items with * indicate a potential emergency and should be followed up as soon as possible.  One of the nurses will contact you 24 hours after your treatment. Please let the nurse know about any problems that you may have experienced. Feel free to call the clinic you have any questions or concerns. The clinic phone number is [phone].   I have been informed and understand all the instructions given to me. I know to contact the clinic, my physician, or go to the Emergency Department if any problems should occur. I do not have any questions at this time, but understand that I may call the clinic during office hours   should I have any questions or need assistance in obtaining follow up care.    __________________________________________  _____________  __________ Signature of Patient or Authorized Representative            Date                   Time    __________________________________________ Nurse's Signature

## 2013-11-08 ENCOUNTER — Telehealth: Payer: Self-pay | Admitting: Hematology & Oncology

## 2013-11-08 ENCOUNTER — Encounter: Payer: Self-pay | Admitting: Hematology & Oncology

## 2013-11-08 NOTE — Telephone Encounter (Signed)
Melody Enos Fling, RN, CCM - from [facility] sent a fax form requesting chemo assessment plan on this patient.   Form completed and faxed today to: [phone]  P: [phone] [id number]    COPY SCANNED

## 2013-11-10 ENCOUNTER — Encounter: Payer: Self-pay | Admitting: Hematology & Oncology

## 2013-11-14 ENCOUNTER — Encounter: Payer: Self-pay | Admitting: Hematology & Oncology

## 2013-11-27 ENCOUNTER — Encounter: Payer: Self-pay | Admitting: Hematology & Oncology

## 2013-11-27 ENCOUNTER — Other Ambulatory Visit (HOSPITAL_BASED_OUTPATIENT_CLINIC_OR_DEPARTMENT_OTHER): Payer: PRIVATE HEALTH INSURANCE | Admitting: Lab

## 2013-11-27 ENCOUNTER — Ambulatory Visit (HOSPITAL_BASED_OUTPATIENT_CLINIC_OR_DEPARTMENT_OTHER): Payer: PRIVATE HEALTH INSURANCE

## 2013-11-27 ENCOUNTER — Ambulatory Visit (HOSPITAL_BASED_OUTPATIENT_CLINIC_OR_DEPARTMENT_OTHER): Payer: PRIVATE HEALTH INSURANCE | Admitting: Hematology & Oncology

## 2013-11-27 VITALS — BP 104/70 | HR 72 | Temp 97.8°F | Resp 18 | Ht 67.0 in | Wt 123.0 lb

## 2013-11-27 DIAGNOSIS — C170 Malignant neoplasm of duodenum: Secondary | ICD-10-CM

## 2013-11-27 DIAGNOSIS — C178 Malignant neoplasm of overlapping sites of small intestine: Secondary | ICD-10-CM

## 2013-11-27 DIAGNOSIS — C779 Secondary and unspecified malignant neoplasm of lymph node, unspecified: Secondary | ICD-10-CM

## 2013-11-27 DIAGNOSIS — Z5111 Encounter for antineoplastic chemotherapy: Secondary | ICD-10-CM

## 2013-11-27 LAB — CMP (CANCER CENTER ONLY)
ALT(SGPT): 16 U/L (ref 10–47)
AST: 26 U/L (ref 11–38)
Albumin: 2.8 g/dL — ABNORMAL LOW (ref 3.3–5.5)
Alkaline Phosphatase: 96 U/L — ABNORMAL HIGH (ref 26–84)
BUN, Bld: 16 mg/dL (ref 7–22)
CO2: 25 mEq/L (ref 18–33)
Calcium: 8.2 mg/dL (ref 8.0–10.3)
Chloride: 110 mEq/L — ABNORMAL HIGH (ref 98–108)
Creat: 0.8 mg/dl (ref 0.6–1.2)
Glucose, Bld: 129 mg/dL — ABNORMAL HIGH (ref 73–118)
Potassium: 3.7 mEq/L (ref 3.3–4.7)
Sodium: 138 mEq/L (ref 128–145)
Total Bilirubin: 1 mg/dl (ref 0.20–1.60)
Total Protein: 5.4 g/dL — ABNORMAL LOW (ref 6.4–8.1)

## 2013-11-27 LAB — CBC WITH DIFFERENTIAL (CANCER CENTER ONLY)
BASO#: 0 10*3/uL (ref 0.0–0.2)
BASO%: 0.5 % (ref 0.0–2.0)
EOS%: 2.3 % (ref 0.0–7.0)
Eosinophils Absolute: 0.1 10*3/uL (ref 0.0–0.5)
HEMATOCRIT: 31.8 % — AB (ref 38.7–49.9)
HGB: 10.6 g/dL — ABNORMAL LOW (ref 13.0–17.1)
LYMPH#: 1.3 10*3/uL (ref 0.9–3.3)
LYMPH%: 32.6 % (ref 14.0–48.0)
MCH: 28.2 pg (ref 28.0–33.4)
MCHC: 33.3 g/dL (ref 32.0–35.9)
MCV: 85 fL (ref 82–98)
MONO#: 0.3 10*3/uL (ref 0.1–0.9)
MONO%: 8 % (ref 0.0–13.0)
NEUT#: 2.2 10*3/uL (ref 1.5–6.5)
NEUT%: 56.6 % (ref 40.0–80.0)
PLATELETS: 122 10*3/uL — AB (ref 145–400)
RBC: 3.76 10*6/uL — ABNORMAL LOW (ref 4.20–5.70)
RDW: 17.4 % — ABNORMAL HIGH (ref 11.1–15.7)
WBC: 3.9 10*3/uL — ABNORMAL LOW (ref 4.0–10.0)

## 2013-11-27 LAB — TECHNOLOGIST REVIEW CHCC SATELLITE

## 2013-11-27 MED ORDER — DEXAMETHASONE SODIUM PHOSPHATE 10 MG/ML IJ SOLN
10.0000 mg | Freq: Once | INTRAMUSCULAR | Status: AC
  Administered 2013-11-27: 10 mg via INTRAVENOUS

## 2013-11-27 MED ORDER — SODIUM CHLORIDE 0.9 % IJ SOLN
10.0000 mL | INTRAMUSCULAR | Status: DC | PRN
  Filled 2013-11-27: qty 10

## 2013-11-27 MED ORDER — PANCRELIPASE (LIP-PROT-AMYL) 36000-114000 UNITS PO CPEP: 1.0000 | ORAL_CAPSULE | Freq: Three times a day (TID) | ORAL | Status: DC

## 2013-11-27 MED ORDER — OXALIPLATIN CHEMO INJECTION 100 MG/20ML
125.0000 mg/m2 | Freq: Once | INTRAVENOUS | Status: AC
  Administered 2013-11-27: 200 mg via INTRAVENOUS
  Filled 2013-11-27: qty 40

## 2013-11-27 MED ORDER — DEXTROSE 5 % IV SOLN
Freq: Once | INTRAVENOUS | Status: AC
  Administered 2013-11-27: 12:00:00 via INTRAVENOUS

## 2013-11-27 MED ORDER — ONDANSETRON 8 MG/50ML IVPB (CHCC)
8.0000 mg | Freq: Once | INTRAVENOUS | Status: AC
  Administered 2013-11-27: 8 mg via INTRAVENOUS

## 2013-11-27 MED ORDER — DEXAMETHASONE SODIUM PHOSPHATE 10 MG/ML IJ SOLN
INTRAMUSCULAR | Status: AC
  Filled 2013-11-27: qty 1

## 2013-11-27 NOTE — Patient Instructions (Signed)
Oxaliplatin Injection What is this medicine? OXALIPLATIN (ox AL i PLA tin) is a chemotherapy drug. It targets fast dividing cells, like cancer cells, and causes these cells to die. This medicine is used to treat cancers of the colon and rectum, and many other cancers. This medicine may be used for other purposes; ask your health care provider or pharmacist if you have questions. COMMON BRAND NAME(S): Eloxatin What should I tell my health care provider before I take this medicine? They need to know if you have any of these conditions: -kidney disease -an unusual or allergic reaction to oxaliplatin, other chemotherapy, other medicines, foods, dyes, or preservatives -pregnant or trying to get pregnant -breast-feeding How should I use this medicine? This drug is given as an infusion into a vein. It is administered in a hospital or clinic by a specially trained health care professional. Talk to your pediatrician regarding the use of this medicine in children. Special care may be needed. Overdosage: If you think you have taken too much of this medicine contact a poison control center or emergency room at once. NOTE: This medicine is only for you. Do not share this medicine with others. What if I miss a dose? It is important not to miss a dose. Call your doctor or health care professional if you are unable to keep an appointment. What may interact with this medicine? -medicines to increase blood counts like filgrastim, pegfilgrastim, sargramostim -probenecid -some antibiotics like amikacin, gentamicin, neomycin, polymyxin B, streptomycin, tobramycin -zalcitabine Talk to your doctor or health care professional before taking any of these medicines: -acetaminophen -aspirin -ibuprofen -ketoprofen -naproxen This list may not describe all possible interactions. Give your health care provider a list of all the medicines, herbs, non-prescription drugs, or dietary supplements you use. Also tell them if  you smoke, drink alcohol, or use illegal drugs. Some items may interact with your medicine. What should I watch for while using this medicine? Your condition will be monitored carefully while you are receiving this medicine. You will need important blood work done while you are taking this medicine. This medicine can make you more sensitive to cold. Do not drink cold drinks or use ice. Cover exposed skin before coming in contact with cold temperatures or cold objects. When out in cold weather wear warm clothing and cover your mouth and nose to warm the air that goes into your lungs. Tell your doctor if you get sensitive to the cold. This drug may make you feel generally unwell. This is not uncommon, as chemotherapy can affect healthy cells as well as cancer cells. Report any side effects. Continue your course of treatment even though you feel ill unless your doctor tells you to stop. In some cases, you may be given additional medicines to help with side effects. Follow all directions for their use. Call your doctor or health care professional for advice if you get a fever, chills or sore throat, or other symptoms of a cold or flu. Do not treat yourself. This drug decreases your body's ability to fight infections. Try to avoid being around people who are sick. This medicine may increase your risk to bruise or bleed. Call your doctor or health care professional if you notice any unusual bleeding. Be careful brushing and flossing your teeth or using a toothpick because you may get an infection or bleed more easily. If you have any dental work done, tell your dentist you are receiving this medicine. Avoid taking products that contain aspirin, acetaminophen, ibuprofen, naproxen,   or ketoprofen unless instructed by your doctor. These medicines may hide a fever. Do not become pregnant while taking this medicine. Women should inform their doctor if they wish to become pregnant or think they might be pregnant. There  is a potential for serious side effects to an unborn child. Talk to your health care professional or pharmacist for more information. Do not breast-feed an infant while taking this medicine. Call your doctor or health care professional if you get diarrhea. Do not treat yourself. What side effects may I notice from receiving this medicine? Side effects that you should report to your doctor or health care professional as soon as possible: -allergic reactions like skin rash, itching or hives, swelling of the face, lips, or tongue -low blood counts - This drug may decrease the number of white blood cells, red blood cells and platelets. You may be at increased risk for infections and bleeding. -signs of infection - fever or chills, cough, sore throat, pain or difficulty passing urine -signs of decreased platelets or bleeding - bruising, pinpoint red spots on the skin, black, tarry stools, nosebleeds -signs of decreased red blood cells - unusually weak or tired, fainting spells, lightheadedness -breathing problems -chest pain, pressure -cough -diarrhea -jaw tightness -mouth sores -nausea and vomiting -pain, swelling, redness or irritation at the injection site -pain, tingling, numbness in the hands or feet -problems with balance, talking, walking -redness, blistering, peeling or loosening of the skin, including inside the mouth -trouble passing urine or change in the amount of urine Side effects that usually do not require medical attention (report to your doctor or health care professional if they continue or are bothersome): -changes in vision -constipation -hair loss -loss of appetite -metallic taste in the mouth or changes in taste -stomach pain This list may not describe all possible side effects. Call your doctor for medical advice about side effects. You may report side effects to FDA at 1-800-FDA-1088. Where should I keep my medicine? This drug is given in a hospital or clinic and  will not be stored at home. NOTE: This sheet is a summary. It may not cover all possible information. If you have questions about this medicine, talk to your doctor, pharmacist, or health care provider.  2014, Elsevier/Gold Standard. (2008-03-20 17:22:47)  

## 2013-11-27 NOTE — Progress Notes (Signed)
Hematology and Oncology Follow Up Visit  Keith [name] [id number] 31-Jul-1971 43 y.o. 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  Keith [name] is back for followup. He tolerated his first cycle of chemotherapy pretty well. [name] gained weight. He didn't have much the way nausea vomiting. [name] not had diarrhea. [name] had no abdominal pain.  He went down to see his surgeon a couple weeks ago. Everything appeared to be okay.  [name] had no cough. [name] had no mouth sores. [name] had no bleeding.  Overall, he has a performance status of ECOG 1.  We did go ahead and do a CT scan on him before his treatment started. The CT scan did not show any evidence of recurrent disease or residual disease.    Medications: Current outpatient prescriptions:acetaminophen (TYLENOL) 325 MG tablet, Take 325 mg by mouth as needed., Disp: , Rfl: ;  capecitabine (XELODA) 500 MG tablet, Take 3 tablets (1,500 mg total) by mouth 2 (two) times daily after a meal. Take on days 1-14 of chemotherapy., Disp: 84 tablet, Rfl: 9 dexamethasone (DECADRON) 4 MG tablet, Take 2 tablets (8 mg total) by mouth 2 (two) times daily with a meal. Start the day after chemotherapy for 2 days. Take with food., Disp: 64 tablet, Rfl: 1;  ondansetron (ZOFRAN) 8 MG tablet, Take 1 tablet (8 mg total) by mouth 2 (two) times daily. Start the day after chemo for 2 days. Then take as needed for nausea or vomiting., Disp: 30 tablet, Rfl: 1 prochlorperazine (COMPAZINE) 10 MG tablet, Take 1 tablet (10 mg total) by mouth every 6 (six) hours as needed (Nausea or vomiting)., Disp: 60 tablet, Rfl: 3;  dronabinol (MARINOL) 2.5 MG capsule, Take 1 capsule (2.5 mg total) by mouth 2 (two) times daily before a meal., Disp: 60 capsule, Rfl: 2;  LORazepam (ATIVAN) 0.5 MG tablet, Take 1 tablet (0.5 mg total) by mouth every 6 (six) hours as needed (Nausea or  vomiting)., Disp: 60 tablet, Rfl: 2 megestrol (MEGACE ES) 625 MG/5ML suspension, Take 5 mLs (625 mg total) by mouth daily., Disp: 150 mL, Rfl: 0;  oxandrolone (OXANDRIN) 10 MG tablet, Take 1 tablet daily, Disp: 30 tablet, Rfl: 3  Allergies: No Known Allergies  Past Medical History, Surgical history, Social history, and Family History were reviewed and updated.  Review of Systems: As above  Physical Exam:  height is 5\' 7"  (1.702 m) and weight is 123 lb (55.792 kg). His oral temperature is 97.8 F (36.6 C). His blood pressure is 104/70 and his pulse is 72. His respiration is 18.   Thin [place] gentleman. Lungs are clear. Cardiac exam regular in rhythm. Abdomen soft. Well-healed laparotomy scar. No fluid wave. No palpable liver or spleen tip. No bone mass. Back exam no tenderness over the spine ribs or hips. Extremities no clubbing cyanosis or edema. Lab Results  Component Value Date   WBC 3.9* 11/27/2013   HGB 10.6* 11/27/2013   HCT 31.8* 11/27/2013   MCV 85 11/27/2013   PLT 122* 11/27/2013     Chemistry      Component Value Date/Time   NA 138 11/27/2013 0936   NA 139 10/26/2013 1528   K 3.7 11/27/2013 0936   K 4.5 10/26/2013 1528   CL 110* 11/27/2013 0936   CL 102 10/26/2013 1528   CO2 25 11/27/2013 0936   CO2  28 10/26/2013 1528   BUN 16 11/27/2013 0936   BUN 15 10/26/2013 1528   CREATININE 0.8 11/27/2013 0936   CREATININE 0.76 10/26/2013 1528      Component Value Date/Time   CALCIUM 8.2 11/27/2013 0936   CALCIUM 8.8 10/26/2013 1528   ALKPHOS 96* 11/27/2013 0936   ALKPHOS 765* 10/26/2013 1528   AST 26 11/27/2013 0936   AST 178* 10/26/2013 1528   ALT 16 11/27/2013 0936   ALT 263* 10/26/2013 1528   BILITOT 1.00 11/27/2013 0936   BILITOT 1.6* 10/26/2013 1528         Impression and Plan: [name] is 43 year old [place] gentleman with locally advanced adenocarcinoma of the small bowel. He had 9 positive lymph nodes.  He is on adjuvant chemotherapy. Again, we will get in4 cycles of full  dose chemotherapy and then he'll go to radiation and low-dose chemotherapy. We will then follow that up with4 more cycles of full dose chemotherapy.  I will put him on some Creon to try to help with absorption. Hopefully this will help gained some more weight.  We will plan to get back to see [place] in 3 more weeks.  Volanda Napoleon, MD 3/23/201511:08 AM

## 2013-11-29 ENCOUNTER — Telehealth: Payer: Self-pay | Admitting: Hematology & Oncology

## 2013-11-29 LAB — PREALBUMIN: PREALBUMIN: 25.2 mg/dL (ref 17.0–34.0)

## 2013-11-29 NOTE — Telephone Encounter (Signed)
Faxed Medical Records via fax today  to:  [place], [place] Ph: [phone] Fx: [phone]   Medical  Records requested 2015 to present   Everly SCANNED

## 2013-12-18 ENCOUNTER — Ambulatory Visit (HOSPITAL_BASED_OUTPATIENT_CLINIC_OR_DEPARTMENT_OTHER): Payer: PRIVATE HEALTH INSURANCE

## 2013-12-18 ENCOUNTER — Other Ambulatory Visit (HOSPITAL_BASED_OUTPATIENT_CLINIC_OR_DEPARTMENT_OTHER): Payer: PRIVATE HEALTH INSURANCE | Admitting: Lab

## 2013-12-18 ENCOUNTER — Other Ambulatory Visit: Payer: No Typology Code available for payment source | Admitting: Lab

## 2013-12-18 ENCOUNTER — Ambulatory Visit (HOSPITAL_BASED_OUTPATIENT_CLINIC_OR_DEPARTMENT_OTHER): Payer: PRIVATE HEALTH INSURANCE | Admitting: Hematology & Oncology

## 2013-12-18 ENCOUNTER — Encounter: Payer: Self-pay | Admitting: Hematology & Oncology

## 2013-12-18 ENCOUNTER — Ambulatory Visit: Payer: No Typology Code available for payment source

## 2013-12-18 VITALS — BP 117/80 | HR 80 | Temp 98.9°F | Resp 16 | Ht 67.0 in | Wt 127.0 lb

## 2013-12-18 DIAGNOSIS — C178 Malignant neoplasm of overlapping sites of small intestine: Secondary | ICD-10-CM

## 2013-12-18 DIAGNOSIS — C779 Secondary and unspecified malignant neoplasm of lymph node, unspecified: Secondary | ICD-10-CM

## 2013-12-18 DIAGNOSIS — C170 Malignant neoplasm of duodenum: Secondary | ICD-10-CM

## 2013-12-18 DIAGNOSIS — Z5111 Encounter for antineoplastic chemotherapy: Secondary | ICD-10-CM

## 2013-12-18 LAB — CBC WITH DIFFERENTIAL (CANCER CENTER ONLY)
BASO#: 0 10*3/uL (ref 0.0–0.2)
BASO%: 0.5 % (ref 0.0–2.0)
EOS ABS: 0.1 10*3/uL (ref 0.0–0.5)
EOS%: 2.7 % (ref 0.0–7.0)
HCT: 32.6 % — ABNORMAL LOW (ref 38.7–49.9)
HGB: 10.9 g/dL — ABNORMAL LOW (ref 13.0–17.1)
LYMPH#: 1.2 10*3/uL (ref 0.9–3.3)
LYMPH%: 31.4 % (ref 14.0–48.0)
MCH: 28.1 pg (ref 28.0–33.4)
MCHC: 33.4 g/dL (ref 32.0–35.9)
MCV: 84 fL (ref 82–98)
MONO#: 0.6 10*3/uL (ref 0.1–0.9)
MONO%: 14.9 % — ABNORMAL HIGH (ref 0.0–13.0)
NEUT%: 50.5 % (ref 40.0–80.0)
NEUTROS ABS: 1.9 10*3/uL (ref 1.5–6.5)
PLATELETS: 152 10*3/uL (ref 145–400)
RBC: 3.88 10*6/uL — AB (ref 4.20–5.70)
RDW: 21.1 % — ABNORMAL HIGH (ref 11.1–15.7)
WBC: 3.7 10*3/uL — ABNORMAL LOW (ref 4.0–10.0)

## 2013-12-18 LAB — CMP (CANCER CENTER ONLY)
ALT: 20 U/L (ref 10–47)
AST: 22 U/L (ref 11–38)
Albumin: 3.1 g/dL — ABNORMAL LOW (ref 3.3–5.5)
Alkaline Phosphatase: 74 U/L (ref 26–84)
BUN, Bld: 19 mg/dL (ref 7–22)
CALCIUM: 8.1 mg/dL (ref 8.0–10.3)
CHLORIDE: 111 meq/L — AB (ref 98–108)
CO2: 27 meq/L (ref 18–33)
Creat: 1 mg/dl (ref 0.6–1.2)
Glucose, Bld: 124 mg/dL — ABNORMAL HIGH (ref 73–118)
Potassium: 3.7 mEq/L (ref 3.3–4.7)
SODIUM: 141 meq/L (ref 128–145)
Total Bilirubin: 1.6 mg/dl (ref 0.20–1.60)
Total Protein: 5.7 g/dL — ABNORMAL LOW (ref 6.4–8.1)

## 2013-12-18 LAB — LACTATE DEHYDROGENASE: LDH: 242 U/L (ref 94–250)

## 2013-12-18 MED ORDER — DEXAMETHASONE 4 MG PO TABS: ORAL_TABLET | ORAL | Status: DC

## 2013-12-18 MED ORDER — OXALIPLATIN CHEMO INJECTION 100 MG/20ML
127.0000 mg/m2 | Freq: Once | INTRAVENOUS | Status: AC
  Administered 2013-12-18: 200 mg via INTRAVENOUS
  Filled 2013-12-18: qty 40

## 2013-12-18 MED ORDER — DEXAMETHASONE SODIUM PHOSPHATE 20 MG/5ML IJ SOLN
20.0000 mg | Freq: Once | INTRAMUSCULAR | Status: AC
  Administered 2013-12-18: 20 mg via INTRAVENOUS

## 2013-12-18 MED ORDER — DEXAMETHASONE SODIUM PHOSPHATE 20 MG/5ML IJ SOLN
INTRAMUSCULAR | Status: AC
  Filled 2013-12-18: qty 5

## 2013-12-18 MED ORDER — DEXTROSE 5 % IV SOLN
Freq: Once | INTRAVENOUS | Status: AC
  Administered 2013-12-18: 11:00:00 via INTRAVENOUS

## 2013-12-18 MED ORDER — ONDANSETRON 8 MG/50ML IVPB (CHCC)
8.0000 mg | Freq: Once | INTRAVENOUS | Status: AC
  Administered 2013-12-18: 8 mg via INTRAVENOUS

## 2013-12-18 MED ORDER — DEXAMETHASONE SODIUM PHOSPHATE 10 MG/ML IJ SOLN
10.0000 mg | Freq: Once | INTRAMUSCULAR | Status: AC
  Administered 2013-12-18: 10 mg via INTRAVENOUS

## 2013-12-18 MED ORDER — DEXAMETHASONE SODIUM PHOSPHATE 10 MG/ML IJ SOLN
INTRAMUSCULAR | Status: AC
  Filled 2013-12-18: qty 1

## 2013-12-18 NOTE — Progress Notes (Signed)
1430  Swelling at IV site on right forearm is less.  No redness at site.  Pt states less discomfort.

## 2013-12-18 NOTE — Patient Instructions (Addendum)
Oxaliplatin Injection What is this medicine? OXALIPLATIN (ox AL i PLA tin) is a chemotherapy drug. It targets fast dividing cells, like cancer cells, and causes these cells to die. This medicine is used to treat cancers of the colon and rectum, and many other cancers. This medicine may be used for other purposes; ask your health care provider or pharmacist if you have questions. COMMON BRAND NAME(S): Eloxatin What should I tell my health care provider before I take this medicine? They need to know if you have any of these conditions: -kidney disease -an unusual or allergic reaction to oxaliplatin, other chemotherapy, other medicines, foods, dyes, or preservatives -pregnant or trying to get pregnant -breast-feeding How should I use this medicine? This drug is given as an infusion into a vein. It is administered in a hospital or clinic by a specially trained health care professional. Talk to your pediatrician regarding the use of this medicine in children. Special care may be needed. Overdosage: If you think you have taken too much of this medicine contact a poison control center or emergency room at once. NOTE: This medicine is only for you. Do not share this medicine with others. What if I miss a dose? It is important not to miss a dose. Call your doctor or health care professional if you are unable to keep an appointment. What may interact with this medicine? -medicines to increase blood counts like filgrastim, pegfilgrastim, sargramostim -probenecid -some antibiotics like amikacin, gentamicin, neomycin, polymyxin B, streptomycin, tobramycin -zalcitabine Talk to your doctor or health care professional before taking any of these medicines: -acetaminophen -aspirin -ibuprofen -ketoprofen -naproxen This list may not describe all possible interactions. Give your health care provider a list of all the medicines, herbs, non-prescription drugs, or dietary supplements you use. Also tell them if  you smoke, drink alcohol, or use illegal drugs. Some items may interact with your medicine. What should I watch for while using this medicine? Your condition will be monitored carefully while you are receiving this medicine. You will need important blood work done while you are taking this medicine. This medicine can make you more sensitive to cold. Do not drink cold drinks or use ice. Cover exposed skin before coming in contact with cold temperatures or cold objects. When out in cold weather wear warm clothing and cover your mouth and nose to warm the air that goes into your lungs. Tell your doctor if you get sensitive to the cold. This drug may make you feel generally unwell. This is not uncommon, as chemotherapy can affect healthy cells as well as cancer cells. Report any side effects. Continue your course of treatment even though you feel ill unless your doctor tells you to stop. In some cases, you may be given additional medicines to help with side effects. Follow all directions for their use. Call your doctor or health care professional for advice if you get a fever, chills or sore throat, or other symptoms of a cold or flu. Do not treat yourself. This drug decreases your body's ability to fight infections. Try to avoid being around people who are sick. This medicine may increase your risk to bruise or bleed. Call your doctor or health care professional if you notice any unusual bleeding. Be careful brushing and flossing your teeth or using a toothpick because you may get an infection or bleed more easily. If you have any dental work done, tell your dentist you are receiving this medicine. Avoid taking products that contain aspirin, acetaminophen, ibuprofen, naproxen,   or ketoprofen unless instructed by your doctor. These medicines may hide a fever. Do not become pregnant while taking this medicine. Women should inform their doctor if they wish to become pregnant or think they might be pregnant. There  is a potential for serious side effects to an unborn child. Talk to your health care professional or pharmacist for more information. Do not breast-feed an infant while taking this medicine. Call your doctor or health care professional if you get diarrhea. Do not treat yourself. What side effects may I notice from receiving this medicine? Side effects that you should report to your doctor or health care professional as soon as possible: -allergic reactions like skin rash, itching or hives, swelling of the face, lips, or tongue -low blood counts - This drug may decrease the number of white blood cells, red blood cells and platelets. You may be at increased risk for infections and bleeding. -signs of infection - fever or chills, cough, sore throat, pain or difficulty passing urine -signs of decreased platelets or bleeding - bruising, pinpoint red spots on the skin, black, tarry stools, nosebleeds -signs of decreased red blood cells - unusually weak or tired, fainting spells, lightheadedness -breathing problems -chest pain, pressure -cough -diarrhea -jaw tightness -mouth sores -nausea and vomiting -pain, swelling, redness or irritation at the injection site -pain, tingling, numbness in the hands or feet -problems with balance, talking, walking -redness, blistering, peeling or loosening of the skin, including inside the mouth -trouble passing urine or change in the amount of urine Side effects that usually do not require medical attention (report to your doctor or health care professional if they continue or are bothersome): -changes in vision -constipation -hair loss -loss of appetite -metallic taste in the mouth or changes in taste -stomach pain This list may not describe all possible side effects. Call your doctor for medical advice about side effects. You may report side effects to FDA at 1-800-FDA-1088. Where should I keep my medicine? This drug is given in a hospital or clinic and  will not be stored at home. NOTE: This sheet is a summary. It may not cover all possible information. If you have questions about this medicine, talk to your doctor, pharmacist, or health care provider.  2014, [employer]vier/Gold Standard. (2008-03-20 17:22:47)  Pt instructed to return to clinic 4/14 at 0900, 4/15 at 0900 and 1620 at 1230 for [place] to evaluated the right forearm.  Pt voices understanding.

## 2013-12-18 NOTE — Addendum Note (Signed)
Addended by: Burney Gauze R on: 12/18/2013 03:18 PM   Modules accepted: Orders

## 2013-12-18 NOTE — Progress Notes (Signed)
Hematology and Oncology Follow Up Visit  Keyontay Stolz [id number] 1970/11/20 43 y.o. 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  Coryell is back for followup. He is really doing great. His weight is going up. [name] really had very little in the way toxicity.  His brother came over from [place]. He is very happy that he is doing well.  Mr.Gutter does have some "gas"". This certainly could be from the chemotherapy. He also may be just from his surgery and how the intestines were "reattached". He is on Creon which I think is important for him.  [name] had no cough. She's had no bleeding. There's been no diarrhea. He has had no leg swelling. There's been no rashes. He has had a pain in his hands or feet.  Overall, his performance status is ECOG  1.  Medications: Current outpatient prescriptions:capecitabine (XELODA) 500 MG tablet, Take 3 tablets (1,500 mg total) by mouth 2 (two) times daily after a meal. Take on days 1-14 of chemotherapy., Disp: 84 tablet, Rfl: 9;  dexamethasone (DECADRON) 4 MG tablet, Take 2 tablets (8 mg total) by mouth 2 (two) times daily with a meal. Start the day after chemotherapy for 2 days. Take with food., Disp: 64 tablet, Rfl: 1 dronabinol (MARINOL) 2.5 MG capsule, Take 1 capsule (2.5 mg total) by mouth 2 (two) times daily before a meal., Disp: 60 capsule, Rfl: 2;  megestrol (MEGACE ES) 625 MG/5ML suspension, Take 5 mLs (625 mg total) by mouth daily., Disp: 150 mL, Rfl: 0;  oxandrolone (OXANDRIN) 10 MG tablet, Take 1 tablet daily, Disp: 30 tablet, Rfl: 3 Pancrelipase, Lip-Prot-Amyl, (CREON) 36000 UNITS CPEP, Take 1 capsule by mouth 3 (three) times daily with meals., Disp: 90 capsule, Rfl: 3;  ondansetron (ZOFRAN) 8 MG tablet, Take 1 tablet (8 mg total) by mouth 2 (two) times daily. Start the day after chemo for 2 days. Then take as needed for nausea or vomiting.,  Disp: 30 tablet, Rfl: 1 prochlorperazine (COMPAZINE) 10 MG tablet, Take 1 tablet (10 mg total) by mouth every 6 (six) hours as needed (Nausea or vomiting)., Disp: 60 tablet, Rfl: 3  Allergies: No Known Allergies  Past Medical History, Surgical history, Social history, and Family History were reviewed and updated.  Review of Systems: As above  Physical Exam:  height is 5\' 7"  (1.702 m) and weight is 127 lb (57.607 kg). His oral temperature is 98.9 F (37.2 C). His blood pressure is 117/80 and his pulse is 80. His respiration is 16.   Thin but well-nourished [place] gentleman. Lungs are clear. Cardiac exam regular in rhythm. Oral exam shows no mucositis. Abdomen is soft. Has well-healed laparotomy would. There is no fluid wave. There is no guarding or rebound tenderness. There is no palpable liver or spleen tip. Back exam no tenderness over the spine ribs or hips. Extremities shows no clubbing cyanosis or edema. Has good strength in his legs. He has good range of motion of his joints. Neurological exam shows no focal neurological deficits.  Lab Results  Component Value Date   WBC 3.7* 12/18/2013   HGB 10.9* 12/18/2013   HCT 32.6* 12/18/2013   MCV 84 12/18/2013   PLT 152 12/18/2013     Chemistry      Component Value Date/Time   NA 141 12/18/2013 0914   NA 139 10/26/2013 1528  K 3.7 12/18/2013 0914   K 4.5 10/26/2013 1528   CL 111* 12/18/2013 0914   CL 102 10/26/2013 1528   CO2 27 12/18/2013 0914   CO2 28 10/26/2013 1528   BUN 19 12/18/2013 0914   BUN 15 10/26/2013 1528   CREATININE 1.0 12/18/2013 0914   CREATININE 0.76 10/26/2013 1528      Component Value Date/Time   CALCIUM 8.1 12/18/2013 0914   CALCIUM 8.8 10/26/2013 1528   ALKPHOS 74 12/18/2013 0914   ALKPHOS 765* 10/26/2013 1528   AST 22 12/18/2013 0914   AST 178* 10/26/2013 1528   ALT 20 12/18/2013 0914   ALT 263* 10/26/2013 1528   BILITOT 1.60 12/18/2013 0914   BILITOT 1.6* 10/26/2013 1528         Impression and Plan: Mr. Casella is a  43 year old [place] gentleman. She has a locally advanced adenocarcinoma of the small bowel.  He had 9 positive lymph nodes.  We will go ahead with his third cycle of chemotherapy. After his fourth cycle, we will then get him over to radiation oncology. We will go ahead and see about radiation therapy over the main cancer Center.  Answered all questions that his brother had. Again his brother lives in [place]. He is very relieved to see that his brother here is a doing better and looks better than he would've thought.  I'll plan to see Mr. Splitt back in 3 weeks Volanda Napoleon, MD 4/13/201510:43 AM

## 2013-12-18 NOTE — Progress Notes (Signed)
At 1340 pt c/o aching at IV site (right forearm).  Observed silver dollar size area of swelling.  No redness noted at site.  Attempted to aspirate chemo, unable to withdraw any fluid.  IV DC'ed.  Warm packs applied to site.  Pt states he had just gotten back from the bathroom and noticed the aching.  Approx 1/2 of chemo bag had infused (approx236ml).   IV restarted in left forearm without difficulty.  Excellent blood return.  Oxaliplatin restarted.    1400.  Pt states aching in right forearm is much better.   1420  Pt sleeping.

## 2013-12-19 ENCOUNTER — Encounter: Payer: Self-pay | Admitting: Nurse Practitioner

## 2013-12-19 NOTE — Progress Notes (Signed)
Pt came in for assessment of his R arm. Color WNL. No signs of discoloration, bruising, or turgor issues. The arm is of normal in appearance. No raised areas. No evidence of IV start. Pt denies any pain or irritation. Pt verbalizes understanding to contact office if any concerns arise. Will continue to monitor.

## 2014-01-08 ENCOUNTER — Other Ambulatory Visit (HOSPITAL_BASED_OUTPATIENT_CLINIC_OR_DEPARTMENT_OTHER): Payer: PRIVATE HEALTH INSURANCE | Admitting: Lab

## 2014-01-08 ENCOUNTER — Ambulatory Visit (HOSPITAL_BASED_OUTPATIENT_CLINIC_OR_DEPARTMENT_OTHER): Payer: PRIVATE HEALTH INSURANCE

## 2014-01-08 ENCOUNTER — Encounter: Payer: Self-pay | Admitting: Hematology & Oncology

## 2014-01-08 ENCOUNTER — Ambulatory Visit (HOSPITAL_BASED_OUTPATIENT_CLINIC_OR_DEPARTMENT_OTHER): Payer: PRIVATE HEALTH INSURANCE | Admitting: Hematology & Oncology

## 2014-01-08 VITALS — BP 118/78 | HR 78 | Temp 98.0°F | Resp 16 | Ht 67.0 in | Wt 133.0 lb

## 2014-01-08 DIAGNOSIS — C178 Malignant neoplasm of overlapping sites of small intestine: Secondary | ICD-10-CM

## 2014-01-08 DIAGNOSIS — C179 Malignant neoplasm of small intestine, unspecified: Secondary | ICD-10-CM

## 2014-01-08 DIAGNOSIS — Z5111 Encounter for antineoplastic chemotherapy: Secondary | ICD-10-CM

## 2014-01-08 LAB — CBC WITH DIFFERENTIAL (CANCER CENTER ONLY)
BASO#: 0 10*3/uL (ref 0.0–0.2)
BASO%: 0.4 % (ref 0.0–2.0)
EOS%: 3.1 % (ref 0.0–7.0)
Eosinophils Absolute: 0.1 10*3/uL (ref 0.0–0.5)
HEMATOCRIT: 29.5 % — AB (ref 38.7–49.9)
HEMOGLOBIN: 10.1 g/dL — AB (ref 13.0–17.1)
LYMPH#: 0.8 10*3/uL — ABNORMAL LOW (ref 0.9–3.3)
LYMPH%: 30 % (ref 14.0–48.0)
MCH: 29.5 pg (ref 28.0–33.4)
MCHC: 34.2 g/dL (ref 32.0–35.9)
MCV: 86 fL (ref 82–98)
MONO#: 0.3 10*3/uL (ref 0.1–0.9)
MONO%: 11.7 % (ref 0.0–13.0)
NEUT#: 1.4 10*3/uL — ABNORMAL LOW (ref 1.5–6.5)
NEUT%: 54.8 % (ref 40.0–80.0)
Platelets: 121 10*3/uL — ABNORMAL LOW (ref 145–400)
RBC: 3.42 10*6/uL — ABNORMAL LOW (ref 4.20–5.70)
RDW: 22.8 % — AB (ref 11.1–15.7)
WBC: 2.6 10*3/uL — ABNORMAL LOW (ref 4.0–10.0)

## 2014-01-08 LAB — CMP (CANCER CENTER ONLY)
ALT(SGPT): 27 U/L (ref 10–47)
AST: 28 U/L (ref 11–38)
Albumin: 2.9 g/dL — ABNORMAL LOW (ref 3.3–5.5)
Alkaline Phosphatase: 75 U/L (ref 26–84)
BILIRUBIN TOTAL: 1.8 mg/dL — AB (ref 0.20–1.60)
BUN, Bld: 16 mg/dL (ref 7–22)
CO2: 28 mEq/L (ref 18–33)
CREATININE: 0.8 mg/dL (ref 0.6–1.2)
Calcium: 8.3 mg/dL (ref 8.0–10.3)
Chloride: 108 mEq/L (ref 98–108)
GLUCOSE: 197 mg/dL — AB (ref 73–118)
Potassium: 3.6 mEq/L (ref 3.3–4.7)
Sodium: 138 mEq/L (ref 128–145)
Total Protein: 5.1 g/dL — ABNORMAL LOW (ref 6.4–8.1)

## 2014-01-08 LAB — PREALBUMIN: Prealbumin: 23.7 mg/dL (ref 17.0–34.0)

## 2014-01-08 LAB — LACTATE DEHYDROGENASE: LDH: 232 U/L (ref 94–250)

## 2014-01-08 MED ORDER — OXALIPLATIN CHEMO INJECTION 100 MG/20ML
200.0000 mg | Freq: Once | INTRAVENOUS | Status: AC
  Administered 2014-01-08: 200 mg via INTRAVENOUS
  Filled 2014-01-08: qty 40

## 2014-01-08 MED ORDER — DEXAMETHASONE SODIUM PHOSPHATE 10 MG/ML IJ SOLN
10.0000 mg | Freq: Once | INTRAMUSCULAR | Status: AC
  Administered 2014-01-08: 10 mg via INTRAVENOUS

## 2014-01-08 MED ORDER — ONDANSETRON 8 MG/50ML IVPB (CHCC)
8.0000 mg | Freq: Once | INTRAVENOUS | Status: AC
  Administered 2014-01-08: 8 mg via INTRAVENOUS

## 2014-01-08 MED ORDER — OXALIPLATIN CHEMO INJECTION 100 MG/20ML
130.0000 mg | Freq: Once | INTRAVENOUS | Status: DC
  Filled 2014-01-08: qty 26

## 2014-01-08 MED ORDER — DEXAMETHASONE SODIUM PHOSPHATE 10 MG/ML IJ SOLN
INTRAMUSCULAR | Status: AC
  Filled 2014-01-08: qty 1

## 2014-01-08 MED ORDER — DEXTROSE 5 % IV SOLN
Freq: Once | INTRAVENOUS | Status: AC
  Administered 2014-01-08: 11:00:00 via INTRAVENOUS

## 2014-01-08 NOTE — Patient Instructions (Signed)
Oxaliplatin Injection What is this medicine? OXALIPLATIN (ox AL i PLA tin) is a chemotherapy drug. It targets fast dividing cells, like cancer cells, and causes these cells to die. This medicine is used to treat cancers of the colon and rectum, and many other cancers. This medicine may be used for other purposes; ask your health care provider or pharmacist if you have questions. COMMON BRAND NAME(S): Eloxatin What should I tell my health care provider before I take this medicine? They need to know if you have any of these conditions: -kidney disease -an unusual or allergic reaction to oxaliplatin, other chemotherapy, other medicines, foods, dyes, or preservatives -pregnant or trying to get pregnant -breast-feeding How should I use this medicine? This drug is given as an infusion into a vein. It is administered in a hospital or clinic by a specially trained health care professional. Talk to your pediatrician regarding the use of this medicine in children. Special care may be needed. Overdosage: If you think you have taken too much of this medicine contact a poison control center or emergency room at once. NOTE: This medicine is only for you. Do not share this medicine with others. What if I miss a dose? It is important not to miss a dose. Call your doctor or health care professional if you are unable to keep an appointment. What may interact with this medicine? -medicines to increase blood counts like filgrastim, pegfilgrastim, sargramostim -probenecid -some antibiotics like amikacin, gentamicin, neomycin, polymyxin B, streptomycin, tobramycin -zalcitabine Talk to your doctor or health care professional before taking any of these medicines: -acetaminophen -aspirin -ibuprofen -ketoprofen -naproxen This list may not describe all possible interactions. Give your health care provider a list of all the medicines, herbs, non-prescription drugs, or dietary supplements you use. Also tell them if  you smoke, drink alcohol, or use illegal drugs. Some items may interact with your medicine. What should I watch for while using this medicine? Your condition will be monitored carefully while you are receiving this medicine. You will need important blood work done while you are taking this medicine. This medicine can make you more sensitive to cold. Do not drink cold drinks or use ice. Cover exposed skin before coming in contact with cold temperatures or cold objects. When out in cold weather wear warm clothing and cover your mouth and nose to warm the air that goes into your lungs. Tell your doctor if you get sensitive to the cold. This drug may make you feel generally unwell. This is not uncommon, as chemotherapy can affect healthy cells as well as cancer cells. Report any side effects. Continue your course of treatment even though you feel ill unless your doctor tells you to stop. In some cases, you may be given additional medicines to help with side effects. Follow all directions for their use. Call your doctor or health care professional for advice if you get a fever, chills or sore throat, or other symptoms of a cold or flu. Do not treat yourself. This drug decreases your body's ability to fight infections. Try to avoid being around people who are sick. This medicine may increase your risk to bruise or bleed. Call your doctor or health care professional if you notice any unusual bleeding. Be careful brushing and flossing your teeth or using a toothpick because you may get an infection or bleed more easily. If you have any dental work done, tell your dentist you are receiving this medicine. Avoid taking products that contain aspirin, acetaminophen, ibuprofen, naproxen,   or ketoprofen unless instructed by your doctor. These medicines may hide a fever. Do not become pregnant while taking this medicine. Women should inform their doctor if they wish to become pregnant or think they might be pregnant. There  is a potential for serious side effects to an unborn child. Talk to your health care professional or pharmacist for more information. Do not breast-feed an infant while taking this medicine. Call your doctor or health care professional if you get diarrhea. Do not treat yourself. What side effects may I notice from receiving this medicine? Side effects that you should report to your doctor or health care professional as soon as possible: -allergic reactions like skin rash, itching or hives, swelling of the face, lips, or tongue -low blood counts - This drug may decrease the number of white blood cells, red blood cells and platelets. You may be at increased risk for infections and bleeding. -signs of infection - fever or chills, cough, sore throat, pain or difficulty passing urine -signs of decreased platelets or bleeding - bruising, pinpoint red spots on the skin, black, tarry stools, nosebleeds -signs of decreased red blood cells - unusually weak or tired, fainting spells, lightheadedness -breathing problems -chest pain, pressure -cough -diarrhea -jaw tightness -mouth sores -nausea and vomiting -pain, swelling, redness or irritation at the injection site -pain, tingling, numbness in the hands or feet -problems with balance, talking, walking -redness, blistering, peeling or loosening of the skin, including inside the mouth -trouble passing urine or change in the amount of urine Side effects that usually do not require medical attention (report to your doctor or health care professional if they continue or are bothersome): -changes in vision -constipation -hair loss -loss of appetite -metallic taste in the mouth or changes in taste -stomach pain This list may not describe all possible side effects. Call your doctor for medical advice about side effects. You may report side effects to FDA at 1-800-FDA-1088. Where should I keep my medicine? This drug is given in a hospital or clinic and  will not be stored at home. NOTE: This sheet is a summary. It may not cover all possible information. If you have questions about this medicine, talk to your doctor, pharmacist, or health care provider.  2014, Elsevier/Gold Standard. (2008-03-20 17:22:47)  

## 2014-01-08 NOTE — Progress Notes (Signed)
Hematology and Oncology Follow Up Visit  Keith [name] [id number] [birth year] 43 y.o. 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 He done amazingly well. His weight continues to go up. He now weighs 133 pounds. Previous had no nausea vomiting. [name] had no diarrhea. His appetite has been quite good. [name] had no rashes. [name] had no hand or feet issues with the Xeloda. [name] had no swelling in his legs. [name] had no bleeding.    Medications: Current outpatient prescriptions:capecitabine (XELODA) 500 MG tablet, Take 3 tablets (1,500 mg total) by mouth 2 (two) times daily after a meal. Take on days 1-14 of chemotherapy., Disp: 84 tablet, Rfl: 9;  dexamethasone (DECADRON) 4 MG tablet, Take 2 tablets (8 mg total) by mouth 2 (two) times daily with a meal. Start the day after chemotherapy for 2 days. Take with food., Disp: 64 tablet, Rfl: 1 dronabinol (MARINOL) 2.5 MG capsule, Take 2.5 mg by mouth as needed., Disp: , Rfl: ;  ondansetron (ZOFRAN) 8 MG tablet, Take 8 mg by mouth 2 (two) times daily. Start the day after chemo for 2 days., Disp: , Rfl: ;  Pancrelipase, Lip-Prot-Amyl, (CREON) 36000 UNITS CPEP, Take 1 capsule by mouth 3 (three) times daily with meals., Disp: 90 capsule, Rfl: 3 prochlorperazine (COMPAZINE) 10 MG tablet, Take 1 tablet (10 mg total) by mouth every 6 (six) hours as needed (Nausea or vomiting)., Disp: 60 tablet, Rfl: 3  Allergies: No Known Allergies  Past Medical History, Surgical history, Social history, and Family History were reviewed and updated.  Review of Systems: As above  Physical Exam:  height is 5\' 7"  (1.702 m) and weight is 133 lb (60.328 kg). His oral temperature is 98 F (36.7 C). His blood pressure is 118/78 and his pulse is 78. His respiration is 16.   Well-developed and well-nourished oriole gentleman. His head exam  shows no excess. There is no scleral icterus. There is no adenopathy in neck. Lungs are clear. Cardiac exam regular rhythm with a normal S1-S2. There are no murmurs rubs or bruits. Abdomen is soft. He has a well-healed laparotomy scar. There is no fluid wave. There is no palpable liver or spleen tip. Back exam no tenderness over the spine ribs or hips. Extremities shows no clubbing cyanosis or edema. Neurological exam shows no focal neurological deficits. Skin exam no rashes.  Lab Results  Component Value Date   WBC 2.6* 01/08/2014   HGB 10.1* 01/08/2014   HCT 29.5* 01/08/2014   MCV 86 01/08/2014   PLT 121* 01/08/2014     Chemistry      Component Value Date/Time   NA 141 12/18/2013 0914   NA 139 10/26/2013 1528   K 3.7 12/18/2013 0914   K 4.5 10/26/2013 1528   CL 111* 12/18/2013 0914   CL 102 10/26/2013 1528   CO2 27 12/18/2013 0914   CO2 28 10/26/2013 1528   BUN 19 12/18/2013 0914   BUN 15 10/26/2013 1528   CREATININE 1.0 12/18/2013 0914   CREATININE 0.76 10/26/2013 1528      Component Value Date/Time   CALCIUM 8.1 12/18/2013 0914   CALCIUM 8.8 10/26/2013 1528   ALKPHOS 74 12/18/2013 0914   ALKPHOS 765* 10/26/2013 1528   AST 22 12/18/2013 0914   AST 178* 10/26/2013 1528   ALT 20 12/18/2013 0914  ALT 263* 10/26/2013 1528   BILITOT 1.60 12/18/2013 0914   BILITOT 1.6* 10/26/2013 1528         Impression and Plan: Keith [name] is a 43 year old gentleman with stage IIIc adenocarcinoma of the small bowel. Had 9 positive lymph nodes. He had all margins negative.  He is at significant risk for local and distant recurrence.  We will have completed his first "phase" of treatment. We now will go with radiation and Xeloda after this fourth cycle. I spoke with radiation oncology and left them a message.  I will plan to rescan him in about 3 weeks.  We will go with low-dose Xeloda. At a dose of 1000 mg per meter squared per day.  I probably will get him back to see me in about 4 weeks at which point he should  be close to starting radiation.  I spent a half hour with him talked to him about the next step in our treatment protocol. He understands. I am just very happy that he has gained weight and has done so well.   Volanda Napoleon, MD 5/4/20159:38 AM

## 2014-01-10 ENCOUNTER — Other Ambulatory Visit: Payer: Self-pay | Admitting: *Deleted

## 2014-01-10 DIAGNOSIS — C178 Malignant neoplasm of overlapping sites of small intestine: Secondary | ICD-10-CM

## 2014-01-10 NOTE — Progress Notes (Signed)
Referral made to Dr. Sondra Come Radiation Oncology

## 2014-01-12 NOTE — Progress Notes (Signed)
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  Biopsies revealed:  09/19/2013   Past/Anticipated interventions by surgeon, if any:  Had abdominal laporotamy in [place] 09/19/2013 by Dr. Burnett Sheng.  Past/Anticipated interventions by medical oncology, if any: would treat him with upfront chemotherapy with FOLFOX followed by low-dose Xeloda with radiation followed by full dose chemotherapy with FOLFOX.  Weight changes, if any:  Has lost 6 lbs since 01/08/14, says he is eating less.  Bowel/Bladder complaints, if any: no  Nausea / Vomiting, if any: no  Pain issues, if any: mild abdominal pain.  SAFETY ISSUES:  Prior radiation? no  Pacemaker/ICD? no  Possible current pregnancy? no  Is the patient on methotrexate? no  Current Complaints / other details:  Patient is here with his wife.  He has 2 children.

## 2014-01-18 ENCOUNTER — Ambulatory Visit
Admission: RE | Admit: 2014-01-18 | Discharge: 2014-01-18 | Disposition: A | Discharge status: Home / Self Care | Payer: PRIVATE HEALTH INSURANCE | Source: Ambulatory Visit | Attending: Radiation Oncology | Admitting: Radiation Oncology

## 2014-01-18 ENCOUNTER — Encounter: Payer: Self-pay | Admitting: Radiation Oncology

## 2014-01-18 VITALS — BP 111/84 | HR 80 | Temp 97.8°F | Ht 67.0 in | Wt 126.4 lb

## 2014-01-18 DIAGNOSIS — C170 Malignant neoplasm of duodenum: Secondary | ICD-10-CM | POA: Insufficient documentation

## 2014-01-18 DIAGNOSIS — R109 Unspecified abdominal pain: Secondary | ICD-10-CM | POA: Diagnosis not present

## 2014-01-18 DIAGNOSIS — Z79899 Other long term (current) drug therapy: Secondary | ICD-10-CM | POA: Insufficient documentation

## 2014-01-18 DIAGNOSIS — I10 Essential (primary) hypertension: Secondary | ICD-10-CM | POA: Insufficient documentation

## 2014-01-18 DIAGNOSIS — R51 Headache: Secondary | ICD-10-CM | POA: Diagnosis not present

## 2014-01-18 DIAGNOSIS — Z9089 Acquired absence of other organs: Secondary | ICD-10-CM | POA: Diagnosis not present

## 2014-01-18 DIAGNOSIS — K219 Gastro-esophageal reflux disease without esophagitis: Secondary | ICD-10-CM | POA: Insufficient documentation

## 2014-01-18 DIAGNOSIS — Z800 Family history of malignant neoplasm of digestive organs: Secondary | ICD-10-CM | POA: Insufficient documentation

## 2014-01-18 DIAGNOSIS — C178 Malignant neoplasm of overlapping sites of small intestine: Secondary | ICD-10-CM

## 2014-01-18 DIAGNOSIS — R197 Diarrhea, unspecified: Secondary | ICD-10-CM | POA: Insufficient documentation

## 2014-01-18 DIAGNOSIS — Z510 Encounter for antineoplastic radiation therapy: Secondary | ICD-10-CM | POA: Diagnosis not present

## 2014-01-18 DIAGNOSIS — Z87891 Personal history of nicotine dependence: Secondary | ICD-10-CM | POA: Insufficient documentation

## 2014-01-18 NOTE — Progress Notes (Signed)
Radiation Oncology         [phone] ________________________________  Initial outpatient Consultation  Name: Keith [name] MRN: [id number]  Date: 01/18/2014  DOB: 1971/09/07  QQ:IWLNLG, Bailey Mech, MD  Volanda Napoleon, MD   REFERRING PHYSICIAN: Volanda Napoleon, MD  DIAGNOSIS: Cancer of small intestine, including duodenum    Primary site: Small Intestine   Staging method: AJCC 7th Edition   Pathologic: Stage IIIB (T4, N2, cM0)   Summary: Stage IIIB (T4, N2, cM0)  HISTORY OF PRESENT ILLNESS::Keith [name] is a 43 y.o. male who is seen out courtesy of Dr. Burney Gauze for an opinion concerning radiation therapy as part of management of patient's locally advanced adenocarcinoma of the small bowel. The patient presented last year with gastritis type symptoms. He was found to have a duodenal ulcer. Patient had undergone multiple endoscopies and biopsies which were negative. On 09/19/2013 the patient was taken to the operating room at [facility]) for a planned vagotomy and pyloroplasty. Intraoperatively the patient was noted to have a duodenal mass consistent with duodenal cancer. Patient ended up having a Whipple type procedure.  Intraoperatively the patient was noted to have a palpable mass in the second portion of the duodenum. Patient was able to undergo complete resection however tumor did extend to the pancreatic head. 7 of 11 lymph nodes were positive for metastatic adenocarcinoma. Surgical margins were clear.   1 lymph node in the hepatic region which was positive for carcinoma. There was lymphovascular space invasion noted. The tumor was moderately differentiated. Pathologic staging was T4, N 2. Patient returned at to the [facility] area where he lives. Patient was seen by medical oncology who recommended adjuvant treatment. Patient has recently completed 3 cycles of CAPOX. Overall he has tolerated this well. He has been able to work throughout his adjuvant chemotherapy. Radiation  therapy is been consulted for consideration for local regional therapy given the high risk for recurrence.  PREVIOUS RADIATION THERAPY: No  PAST MEDICAL HISTORY:  has a past medical history of Chronic hepatitis B; Impaired fasting glucose; Hypertension; GERD (gastroesophageal reflux disease); and Cancer of small intestine, including duodenum (10/26/2013).    PAST SURGICAL HISTORY: Past Surgical History  Procedure Laterality Date  . Appendectomy  1997  . Abdominal exploration surgery  09/19/2013    done in [place]: family history includes Cancer in his mother; Colon cancer (age of onset: 68) in his sister; Esophageal cancer (age of onset: 66) in his father; Hypertension in an other family member.  SOCIAL HISTORY:  reports that he quit smoking about 4 months ago. His smoking use included Cigarettes. He started smoking about 27 years ago. He has a 6.25 pack-year smoking history. He has never used smokeless tobacco. He reports that he does not drink alcohol or use illicit drugs. he is married and accompanied by his wife. Patient works full-time as an Optometrist.  He was born and raised in [place] but moved to the [place] approximately 14 years ago.  ALLERGIES: Review of patient's allergies indicates no known allergies.  MEDICATIONS:  Current Outpatient Prescriptions  Medication Sig Dispense Refill  . capecitabine (XELODA) 500 MG tablet Take 3 tablets (1,500 mg total) by mouth 2 (two) times daily after a meal. Take on days 1-14 of chemotherapy.  84 tablet  9  . ondansetron (ZOFRAN) 8 MG tablet Take 8 mg by mouth 2 (two) times daily. Start the day after chemo for 2 days.      [name]  Pancrelipase, Lip-Prot-Amyl, (CREON) 36000 UNITS CPEP Take 1 capsule by mouth 3 (three) times daily with meals.  90 capsule  3  . prochlorperazine (COMPAZINE) 10 MG tablet Take 1 tablet (10 mg total) by mouth every 6 (six) hours as needed (Nausea or vomiting).  60 tablet  3  . dexamethasone (DECADRON) 4 MG  tablet Take 2 tablets (8 mg total) by mouth 2 (two) times daily with a meal. Start the day after chemotherapy for 2 days. Take with food.  64 tablet  1  . dronabinol (MARINOL) 2.5 MG capsule Take 2.5 mg by mouth as needed.       No current facility-administered medications for this encounter.    REVIEW OF SYSTEMS:  A 15 point review of systems is documented in the electronic medical record. This was obtained by the nursing staff. However, I reviewed this with the patient to discuss relevant findings and make appropriate changes.  He does notice occasional episodes of bloating and increased bowel sounds but no abdominal pain. His appetite is so-so. He has been able to gain weight since his operation. Total weight loss was 30-40 pounds.  he denies any new bony pain cough or headaches.   PHYSICAL EXAM:  height is 5\' 7"  (1.702 m) and weight is 126 lb 6.4 oz (57.335 kg). His oral temperature is 97.8 F (36.6 C). His blood pressure is 111/84 and his pulse is 80. His oxygen saturation is 100%.   BP 111/84  Pulse 80  Temp(Src) 97.8 F (36.6 C) (Oral)  Ht 5\' 7"  (1.702 m)  Wt 126 lb 6.4 oz (57.335 kg)  BMI 19.79 kg/m2  SpO2 100%  General Appearance:    Alert, cooperative, no distress, appears stated age  Head:    Normocephalic, without obvious abnormality, atraumatic  Eyes:    PERRL, conjunctiva/corneas clear, EOM's intact,         Ears:    Normal TM's and external ear canals, both ears  Nose:   Nares normal, septum midline, mucosa normal, no drainage    or sinus tenderness  Throat:   Lips, mucosa, and tongue normal; teeth and gums normal  Neck:   Supple, symmetrical, trachea midline, no adenopathy;       thyroid:  No enlargement/tenderness/nodules; no carotid   bruit or JVD  Back:     Symmetric, no curvature, ROM normal, no CVA tenderness  Lungs:     Clear to auscultation bilaterally, respirations unlabored  Chest wall:    No tenderness or deformity  Heart:    Regular rate and rhythm, S1 and  S2 normal, no murmur, rub   or gallop  Abdomen:     Soft, non-tender, bowel sounds active all four quadrants,    no masses, no organomegaly, long vertical scar in the upper abdomen - well healed.        Extremities:   Extremities normal, atraumatic, no cyanosis or edema  Pulses:   2+ and symmetric all extremities  Skin:   Skin color, texture, turgor normal, no rashes or lesions  Lymph nodes:   Cervical, supraclavicular, and axillary nodes normal  Neurologic:    Normal strength, sensation and reflexes      throughout     ECOG = 1    1 - Symptomatic but completely ambulatory (Restricted in physically strenuous activity but ambulatory and able to carry out work of a light or sedentary nature. For example, light housework, office work)   LABORATORY DATA:  Lab Results  Component Value Date  WBC 2.6* 01/08/2014   HGB 10.1* 01/08/2014   HCT 29.5* 01/08/2014   MCV 86 01/08/2014   PLT 121* 01/08/2014   NEUTROABS 1.4* 01/08/2014   Lab Results  Component Value Date   NA 138 01/08/2014   K 3.6 01/08/2014   CL 108 01/08/2014   CO2 28 01/08/2014   GLUCOSE 197* 01/08/2014   CREATININE 0.8 01/08/2014   CALCIUM 8.3 01/08/2014      RADIOGRAPHY: 11/28/2013     CT CHEST, ABDOMEN, AND PELVIS WITH CONTRAST  TECHNIQUE:  Multidetector CT imaging of the chest, abdomen and pelvis was  performed following the standard protocol during bolus  administration of intravenous contrast.  CONTRAST: 100 cc Omnipaque 300  COMPARISON: CT ABD WO/W CM dated 03/28/2013; US ABDOMEN COMPLETE  dated 02/20/2013  FINDINGS:  CT CHEST FINDINGS  No pathologic thoracic adenopathy. 5 mm ground-glass opacity, right  upper lobe, image 10 of series 4. The lungs appear otherwise clear.  CT ABDOMEN AND PELVIS FINDINGS  Mild to moderate intrahepatic biliary dilatation is extrahepatic  biliary duct shortened, likely a postoperative finding. No mass  lesion along the partial gastrectomy site. Pancreatic head absent.  There is some wall  thickening in the jejunum distal to the presumed  gastrojejunostomy shown on images 67 through 72 of series 2.  Interval duodenal resection. I do not observe a discrete hepatic  mass. No give definite gastrohepatic or retroperitoneal adenopathy.  Spleen unremarkable. Bilateral patchy regions of striated nephrogram  observed, and or not present on the prior exam from 03/28/2013.  Single right and the 2 left renal arteries appear patent.  Borderline prominence of stool in the ascending and transverse colon  but not distally in the colon. 5 mm filling defect in the right  lower quadrant small bowel, image 89 of series 2, conceivably a  polyp or food fragment.  Mucosal enhancement in the terminal ileum is probably incidental.  The appendix is not well seen, compatible with prior appendectomy.  Urinary bladder unremarkable. No pathologic pelvic adenopathy is  observed.  IMPRESSION:  1. Postoperative findings from prior duodenum resections and partial  gastrectomy and partial pancreatectomy. No recurrent mass or hepatic  masses observed. No definite adenopathy. Intrahepatic biliary  dilatation with short segment of extrahepatic bile duct, presumably  due to proximal anastomosis.  2. Mild wall thickening in the jejunum distal to the presumed  gastrojejunostomy site, possibly incidental due to peristalsis  although inflammation is not excluded. No leak observed.  3. Bilateral patchy regions of striated nephrogram on the  tubular/delayed phase images, not present previously, differential  diagnostic considerations include bilateral pyelonephritis, acute  tubular necrosis, acute tubular obstruction, or hypotension. No  hydronephrosis or hydroureter. Correlate with renal function and  urinalysis.  4. Small filling defect in the ileum is probably incidental but  could represent a 5 mm polyp.  5. 5 mm ground-glass opacity in the right upper lobe. Given the  size, no further workup is required.  This recommendation follows the  consensus statement: Recommendations for the Management of Subsolid  Pulmonary Nodules Detected at CT: A Statement from the [place] as published in Radiology 2013; 266:304-317.  Electronically Signed  By: Sherryl Barters M.D.  On: 11/28/2013 13:33   IMPRESSION: Cancer of small intestine, including duodenum    Primary site: Small Intestine   Staging method: AJCC 7th Edition   Pathologic: Stage IIIB (T4, N2, cM0)    Summary: Stage IIIB (T4, N2, cM0)  The patient is at high risk  for local/regional and systemic recurrence. Patient would likely benefit from postoperative radiation therapy. I discussed with the patient however there has not been a  a survival benefit with radiation therapy in this situation. Patient does wish to be aggressive with his postoperative treatment and wishes to include radiation therapy as part of his overall adjuvant treatment management. I discussed treatment course side effects and potential toxicities of radiation therapy in this situation. Patient appears to understand and agrees to treatment as planned.   PLAN: Simulation and planning May 19. Patient is scheduled for diagnostic studies on May 18 and I will await results of these studies to rule out systemic recurrence prior to proceeding with the patient's postoperative radiation therapy.  I spent 60 minutes minutes face to face with the patient and his wife and more than 50% of that time was spent in counseling and/or coordination of care.   ------------------------------------------------  Blair Promise, PhD, MD

## 2014-01-18 NOTE — Progress Notes (Signed)
Please see the Nurse Progress Note in the MD Initial Consult Encounter for this patient. 

## 2014-01-22 ENCOUNTER — Ambulatory Visit (HOSPITAL_BASED_OUTPATIENT_CLINIC_OR_DEPARTMENT_OTHER)
Admission: RE | Admit: 2014-01-22 | Discharge: 2014-01-22 | Disposition: A | Discharge status: Home / Self Care | Payer: PRIVATE HEALTH INSURANCE | Source: Ambulatory Visit | Attending: Hematology & Oncology | Admitting: Hematology & Oncology

## 2014-01-22 ENCOUNTER — Encounter (HOSPITAL_BASED_OUTPATIENT_CLINIC_OR_DEPARTMENT_OTHER): Payer: Self-pay

## 2014-01-22 ENCOUNTER — Telehealth: Payer: Self-pay | Admitting: *Deleted

## 2014-01-22 ENCOUNTER — Encounter: Payer: Self-pay | Admitting: Radiation Oncology

## 2014-01-22 ENCOUNTER — Other Ambulatory Visit: Payer: Self-pay | Admitting: Hematology & Oncology

## 2014-01-22 DIAGNOSIS — Z9221 Personal history of antineoplastic chemotherapy: Secondary | ICD-10-CM | POA: Insufficient documentation

## 2014-01-22 DIAGNOSIS — K7689 Other specified diseases of liver: Secondary | ICD-10-CM | POA: Insufficient documentation

## 2014-01-22 DIAGNOSIS — C178 Malignant neoplasm of overlapping sites of small intestine: Secondary | ICD-10-CM

## 2014-01-22 DIAGNOSIS — Z8509 Personal history of malignant neoplasm of other digestive organs: Secondary | ICD-10-CM | POA: Insufficient documentation

## 2014-01-22 DIAGNOSIS — Z9049 Acquired absence of other specified parts of digestive tract: Secondary | ICD-10-CM | POA: Insufficient documentation

## 2014-01-22 DIAGNOSIS — Z931 Gastrostomy status: Secondary | ICD-10-CM | POA: Insufficient documentation

## 2014-01-22 DIAGNOSIS — Z09 Encounter for follow-up examination after completed treatment for conditions other than malignant neoplasm: Secondary | ICD-10-CM | POA: Insufficient documentation

## 2014-01-22 MED ORDER — CAPECITABINE 500 MG PO TABS: ORAL_TABLET | ORAL | Status: DC

## 2014-01-22 MED ORDER — IOHEXOL 300 MG/ML  SOLN
100.0000 mL | Freq: Once | INTRAMUSCULAR | Status: AC | PRN
Start: 2014-01-22 — End: 2014-01-22
  Administered 2014-01-22: 100 mL via INTRAVENOUS

## 2014-01-22 NOTE — Telephone Encounter (Signed)
Called in updated prescription for Xeloda.

## 2014-01-22 NOTE — Progress Notes (Signed)
5.18.15 2:50pm:  LVM for case mgr, Melody Stafford, at [facility].  Melody is out of the office 5/18-5/26, but has someone covering for her.  They will contact me if they need information.

## 2014-01-23 ENCOUNTER — Ambulatory Visit
Admission: RE | Admit: 2014-01-23 | Discharge: 2014-01-23 | Disposition: A | Discharge status: Home / Self Care | Payer: PRIVATE HEALTH INSURANCE | Source: Ambulatory Visit | Attending: Radiation Oncology | Admitting: Radiation Oncology

## 2014-01-23 ENCOUNTER — Telehealth: Payer: Self-pay | Admitting: Nurse Practitioner

## 2014-01-23 DIAGNOSIS — Z510 Encounter for antineoplastic radiation therapy: Secondary | ICD-10-CM | POA: Diagnosis not present

## 2014-01-23 DIAGNOSIS — C178 Malignant neoplasm of overlapping sites of small intestine: Secondary | ICD-10-CM

## 2014-01-23 NOTE — Telephone Encounter (Addendum)
Message copied by Jimmy Footman on Tue Jan 23, 2014 11:12 AM ------      Message from: Volanda Napoleon      Created: Tue Jan 23, 2014  7:00 AM       Please call and get him know that there is no evidence of recurrent cancer. Thanks. Pete ------Pt verbalized understanding and appreciation.

## 2014-01-25 NOTE — Progress Notes (Signed)
  Radiation Oncology         [phone] ________________________________  Name: Keith [name] MRN: [id number]  Date: 01/23/2014  DOB: [birth year]  Optical Surface Tracking Plan:  Since intensity modulated radiotherapy (IMRT) and 3D conformal radiation treatment methods are predicated on accurate and precise positioning for treatment, intrafraction motion monitoring is medically necessary to ensure accurate and safe treatment delivery.  The ability to quantify intrafraction motion without excessive ionizing radiation dose can only be performed with optical surface tracking. Accordingly, surface imaging offers the opportunity to obtain 3D measurements of patient position throughout IMRT and 3D treatments without excessive radiation exposure.  I am ordering optical surface tracking for this patient's upcoming course of radiotherapy. ________________________________  Blair Promise, MD 01/25/2014 3:47 PM    Reference:   Particia Jasper, et al. Surface imaging-based analysis of intrafraction motion for breast radiotherapy patients.Journal of [place], n. 6, nov. 2014. ISSN [id number].   Available at: <http://www.jacmp.org/index.php/jacmp/article/view/4957>.

## 2014-01-25 NOTE — Progress Notes (Signed)
  Radiation Oncology         [phone] ________________________________  Name: Keith [name] MRN: [id number]  Date: 01/23/2014  DOB: 03/31/1971  Special treatment procedure note:  The patient will be receiving radiosensitizing oral Xeloda chemotherapy throughout his radiation treatments. Given the increased potential toxicities as well as the necessity for close monitoring of the patient and bloodwork, this constitutes a special treatment procedure.   Blair Promise, MD   >.

## 2014-01-25 NOTE — Progress Notes (Signed)
  Radiation Oncology         [phone] ________________________________  Name: Keith [name] MRN: [id number]  Date: 01/23/2014  DOB: 02-27-1971  SIMULATION AND TREATMENT PLANNING NOTE  DIAGNOSIS:  Cancer of small intestine, including duodenum   Primary site: Small Intestine   Staging method: AJCC 7th Edition   Pathologic: Stage IIIB (T4, N2, cM0)    Summary: Stage IIIB (T4, N2, cM0)   NARRATIVE:  The patient was brought to the [facility].  Identity was confirmed.  All relevant records and images related to the planned course of therapy were reviewed.  The patient freely provided informed written consent to proceed with treatment after reviewing the details related to the planned course of therapy. The consent form was witnessed and verified by the simulation staff.  Then, the patient was set-up in a stable reproducible  supine position for radiation therapy.  CT images were obtained.  Surface markings were placed.  The CT images were loaded into the planning software.  Then the target and avoidance structures were contoured.  Treatment planning then occurred.  The radiation prescription was entered and confirmed.  Then, I designed and supervised the construction of a total of 5 medically necessary complex treatment devices.  I have requested : 3D Simulation  I have requested a DVH of the following structures: CTV, PTV, liver, kidneys,  stomach, spinal cord.  I have ordered:Nutrition Consult and dose calc.  PLAN:  The patient will receive 45 Gy in 25 fractions along with radiosensitizing oral Xeloda chemotherapy. He will start his radiation therapy next week.  ________________________________  -----------------------------------  Blair Promise, PhD, MD

## 2014-01-30 DIAGNOSIS — Z510 Encounter for antineoplastic radiation therapy: Secondary | ICD-10-CM | POA: Diagnosis not present

## 2014-01-31 ENCOUNTER — Ambulatory Visit
Admission: RE | Admit: 2014-01-31 | Discharge: 2014-01-31 | Disposition: A | Discharge status: Home / Self Care | Payer: PRIVATE HEALTH INSURANCE | Source: Ambulatory Visit | Attending: Radiation Oncology | Admitting: Radiation Oncology

## 2014-01-31 DIAGNOSIS — Z510 Encounter for antineoplastic radiation therapy: Secondary | ICD-10-CM | POA: Diagnosis not present

## 2014-01-31 DIAGNOSIS — C178 Malignant neoplasm of overlapping sites of small intestine: Secondary | ICD-10-CM

## 2014-02-01 ENCOUNTER — Ambulatory Visit
Admission: RE | Admit: 2014-02-01 | Discharge: 2014-02-01 | Disposition: A | Discharge status: Home / Self Care | Payer: PRIVATE HEALTH INSURANCE | Source: Ambulatory Visit | Attending: Radiation Oncology | Admitting: Radiation Oncology

## 2014-02-01 DIAGNOSIS — Z510 Encounter for antineoplastic radiation therapy: Secondary | ICD-10-CM | POA: Diagnosis not present

## 2014-02-01 DIAGNOSIS — C178 Malignant neoplasm of overlapping sites of small intestine: Secondary | ICD-10-CM

## 2014-02-01 MED ORDER — RADIAPLEXRX EX GEL
Freq: Once | CUTANEOUS | Status: AC
  Administered 2014-02-01: 08:00:00 via TOPICAL

## 2014-02-01 NOTE — Progress Notes (Signed)
Patient given the Radiation Therapy and You book with the appropriate pages turned down for him to review.  He was also given radiaplex gel.

## 2014-02-02 ENCOUNTER — Ambulatory Visit
Admission: RE | Admit: 2014-02-02 | Discharge: 2014-02-02 | Disposition: A | Discharge status: Home / Self Care | Payer: PRIVATE HEALTH INSURANCE | Source: Ambulatory Visit | Attending: Radiation Oncology | Admitting: Radiation Oncology

## 2014-02-02 DIAGNOSIS — Z510 Encounter for antineoplastic radiation therapy: Secondary | ICD-10-CM | POA: Diagnosis not present

## 2014-02-05 ENCOUNTER — Ambulatory Visit
Admission: RE | Admit: 2014-02-05 | Discharge: 2014-02-05 | Disposition: A | Discharge status: Home / Self Care | Payer: PRIVATE HEALTH INSURANCE | Source: Ambulatory Visit | Attending: Radiation Oncology | Admitting: Radiation Oncology

## 2014-02-05 DIAGNOSIS — Z510 Encounter for antineoplastic radiation therapy: Secondary | ICD-10-CM | POA: Diagnosis not present

## 2014-02-06 ENCOUNTER — Ambulatory Visit
Admission: RE | Admit: 2014-02-06 | Discharge: 2014-02-06 | Disposition: A | Discharge status: Home / Self Care | Payer: PRIVATE HEALTH INSURANCE | Source: Ambulatory Visit | Attending: Radiation Oncology | Admitting: Radiation Oncology

## 2014-02-06 ENCOUNTER — Encounter: Payer: Self-pay | Admitting: Hematology & Oncology

## 2014-02-06 ENCOUNTER — Other Ambulatory Visit (HOSPITAL_BASED_OUTPATIENT_CLINIC_OR_DEPARTMENT_OTHER): Payer: PRIVATE HEALTH INSURANCE | Admitting: Lab

## 2014-02-06 ENCOUNTER — Ambulatory Visit (HOSPITAL_BASED_OUTPATIENT_CLINIC_OR_DEPARTMENT_OTHER): Payer: PRIVATE HEALTH INSURANCE | Admitting: Hematology & Oncology

## 2014-02-06 VITALS — BP 122/88 | HR 74 | Temp 98.0°F | Resp 16 | Ht 67.0 in | Wt 127.0 lb

## 2014-02-06 VITALS — BP 112/80 | HR 69 | Temp 98.1°F | Ht 67.0 in | Wt 129.1 lb

## 2014-02-06 DIAGNOSIS — C178 Malignant neoplasm of overlapping sites of small intestine: Secondary | ICD-10-CM

## 2014-02-06 DIAGNOSIS — C170 Malignant neoplasm of duodenum: Secondary | ICD-10-CM

## 2014-02-06 DIAGNOSIS — Z510 Encounter for antineoplastic radiation therapy: Secondary | ICD-10-CM | POA: Diagnosis not present

## 2014-02-06 DIAGNOSIS — C260 Malignant neoplasm of intestinal tract, part unspecified: Secondary | ICD-10-CM

## 2014-02-06 LAB — CMP (CANCER CENTER ONLY)
ALBUMIN: 3.3 g/dL (ref 3.3–5.5)
ALK PHOS: 117 U/L — AB (ref 26–84)
ALT(SGPT): 26 U/L (ref 10–47)
AST: 30 U/L (ref 11–38)
BUN, Bld: 12 mg/dL (ref 7–22)
CO2: 29 mEq/L (ref 18–33)
Calcium: 8.6 mg/dL (ref 8.0–10.3)
Chloride: 104 mEq/L (ref 98–108)
Creat: 0.8 mg/dl (ref 0.6–1.2)
Glucose, Bld: 92 mg/dL (ref 73–118)
Potassium: 4.1 mEq/L (ref 3.3–4.7)
SODIUM: 141 meq/L (ref 128–145)
TOTAL PROTEIN: 5.9 g/dL — AB (ref 6.4–8.1)
Total Bilirubin: 2 mg/dl — ABNORMAL HIGH (ref 0.20–1.60)

## 2014-02-06 LAB — CBC WITH DIFFERENTIAL (CANCER CENTER ONLY)
BASO#: 0 10*3/uL (ref 0.0–0.2)
BASO%: 0.7 % (ref 0.0–2.0)
EOS%: 2.6 % (ref 0.0–7.0)
Eosinophils Absolute: 0.1 10*3/uL (ref 0.0–0.5)
HEMATOCRIT: 34 % — AB (ref 38.7–49.9)
HEMOGLOBIN: 11.1 g/dL — AB (ref 13.0–17.1)
LYMPH#: 0.7 10*3/uL — ABNORMAL LOW (ref 0.9–3.3)
LYMPH%: 26.6 % (ref 14.0–48.0)
MCH: 31.4 pg (ref 28.0–33.4)
MCHC: 32.6 g/dL (ref 32.0–35.9)
MCV: 96 fL (ref 82–98)
MONO#: 0.5 10*3/uL (ref 0.1–0.9)
MONO%: 17.2 % — AB (ref 0.0–13.0)
NEUT#: 1.5 10*3/uL (ref 1.5–6.5)
NEUT%: 52.9 % (ref 40.0–80.0)
Platelets: 97 10*3/uL — ABNORMAL LOW (ref 145–400)
RBC: 3.53 10*6/uL — ABNORMAL LOW (ref 4.20–5.70)
RDW: 21.7 % — ABNORMAL HIGH (ref 11.1–15.7)
WBC: 2.7 10*3/uL — ABNORMAL LOW (ref 4.0–10.0)

## 2014-02-06 NOTE — Progress Notes (Signed)
Hematology and Oncology Follow Up Visit  Keith [name] [id number] 1970-10-17 43 y.o. 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 Patient has started concurrent radiation therapy and Xeloda     Interim History:  [name] is back for followup. He really looks good. He completed his chemotherapy without much problems. He did have a little bit of neuropathy.  He now has started radiation therapy. We have him on low-dose Xeloda. His dose is 1500 mg a day.  We did go ahead and repeat his scans after the chemotherapy. The scans that did not show any evidence of recurrent disease.  He seemed better. [name] had no nausea vomiting. She's had no diarrhea. [name] had no cough. There's been no rashes. [name] had no leg swelling.  He still working.  Overall, his performance status is ECOG 1  Medications: Current outpatient prescriptions:capecitabine (XELODA) 500 MG tablet, Take 3 pills a day throughout radiation.Do NOT take any on Saturday or Sunday, Disp: 90 tablet, Rfl: 0;  hyaluronate sodium (RADIAPLEXRX) GEL, Apply 1 application topically 2 (two) times daily., Disp: , Rfl: ;  Pancrelipase, Lip-Prot-Amyl, (CREON) 36000 UNITS CPEP, Take 1 capsule by mouth 3 (three) times daily with meals., Disp: 90 capsule, Rfl: 3 dexamethasone (DECADRON) 4 MG tablet, Take 2 tablets (8 mg total) by mouth 2 (two) times daily with a meal. Start the day after chemotherapy for 2 days. Take with food., Disp: 64 tablet, Rfl: 1;  dronabinol (MARINOL) 2.5 MG capsule, Take 2.5 mg by mouth as needed., Disp: , Rfl: ;  ondansetron (ZOFRAN) 8 MG tablet, Take 8 mg by mouth 2 (two) times daily. Start the day after chemo for 2 days., Disp: , Rfl:  prochlorperazine (COMPAZINE) 10 MG tablet, Take 1 tablet (10 mg total) by mouth every 6 (six) hours as needed (Nausea or vomiting)., Disp: 60 tablet, Rfl: 3  Allergies: No Known Allergies  Past Medical  History, Surgical history, Social history, and Family History were reviewed and updated.  Review of Systems: As above  Physical Exam:  height is 5\' 7"  (1.702 m) and weight is 127 lb (57.607 kg). His oral temperature is 98 F (36.7 C). His blood pressure is 122/88 and his pulse is 74. His respiration is 16.   The patient well-nourished Asian gentleman. His head exam shows no ocular or oral lesions. He has no adenopathy in the neck. Lungs are clear. Cardiac exam regular in rhythm with no murmurs rubs or bruits. Abdomen is soft. Has good bowel sounds. There is no fluid wave. There is no palpable hepatosplenomegaly. He has well-healed laparotomy scar. Extremities shows no clubbing cyanosis or edema. Skin exam no rashes. Neurological exam is nonfocal.  Lab Results  Component Value Date   WBC 2.7* 02/06/2014   HGB 11.1* 02/06/2014   HCT 34.0* 02/06/2014   MCV 96 02/06/2014   PLT 97* 02/06/2014     Chemistry      Component Value Date/Time   NA 141 02/06/2014 1135   NA 139 10/26/2013 1528   K 4.1 02/06/2014 1135   K 4.5 10/26/2013 1528   CL 104 02/06/2014 1135   CL 102 10/26/2013 1528   CO2 29 02/06/2014 1135   CO2 28 10/26/2013 1528   BUN 12 02/06/2014 1135   BUN 15 10/26/2013 1528   CREATININE 0.8 02/06/2014 1135   CREATININE 0.76 10/26/2013 1528      Component Value Date/Time  CALCIUM 8.6 02/06/2014 1135   CALCIUM 8.8 10/26/2013 1528   ALKPHOS 117* 02/06/2014 1135   ALKPHOS 765* 10/26/2013 1528   AST 30 02/06/2014 1135   AST 178* 10/26/2013 1528   ALT 26 02/06/2014 1135   ALT 263* 10/26/2013 1528   BILITOT 2.00* 02/06/2014 1135   BILITOT 1.6* 10/26/2013 1528         Impression and Plan: Keith [name] is a 43 year old gentleman with stage IIIc adenocarcinoma of the small intestine. He underwent resection. He is on adjuvant chemotherapy. He is now getting radiation therapy with low-dose Xeloda. He is doing well. He still has about 5 weeks to go.  We will continue to follow along. I want to get blood work on him  weekly. He can have this done while at radiation therapy.  Once he completes the radiation, we will go ahead and plan for a final 4 cycles of chemotherapy with CAPOX .   Volanda Napoleon, MD 6/2/20152:23 PM

## 2014-02-06 NOTE — Progress Notes (Signed)
  Radiation Oncology         [phone] ________________________________  Name: Keith [name] MRN: [id number]  Date: 02/06/2014  DOB: [birth year]  Weekly Radiation Therapy Management  Cancer of small intestine, including duodenum   Primary site: Small Intestine   Staging method: AJCC 7th Edition   Pathologic: Stage IIIB (T4, N2, cM0) signed by Volanda Napoleon, MD on 10/26/2013  3:17 PM   Summary: Stage IIIB (T4, N2, cM0)  Current Dose: 9 Gy     Planned Dose:  45 Gy  Narrative . . . . . . . . The patient presents for routine under treatment assessment.                                   The patient is without complaint. Tolerating both chemotherapy and radiation treatments well                                 Set-up films were reviewed.                                 The chart was checked. Physical Findings. . .  height is 5\' 7"  (1.702 m) and weight is 129 lb 1.6 oz (58.559 kg). His oral temperature is 98.1 F (36.7 C). His blood pressure is 112/80 and his pulse is 69. . The lungs are clear. The heart has a regular rhythm and rate. The abdomen is soft and nontender with normal bowel sounds. Impression . . . . . . . The patient is tolerating radiation. Plan . . . . . . . . . . . . Continue treatment as planned.  ________________________________   Blair Promise, PhD, MD

## 2014-02-06 NOTE — Progress Notes (Signed)
Keith [name] has had 5 fractions to his abdomen.  He denies pain and nausea.  He is taking xeloda once a day on treatment days.  He reports his appetite is OK.  His weight is up 2 lbs from last week.

## 2014-02-07 ENCOUNTER — Ambulatory Visit
Admission: RE | Admit: 2014-02-07 | Discharge: 2014-02-07 | Disposition: A | Discharge status: Home / Self Care | Payer: PRIVATE HEALTH INSURANCE | Source: Ambulatory Visit | Attending: Radiation Oncology | Admitting: Radiation Oncology

## 2014-02-07 ENCOUNTER — Telehealth: Payer: Self-pay | Admitting: Hematology & Oncology

## 2014-02-07 DIAGNOSIS — Z510 Encounter for antineoplastic radiation therapy: Secondary | ICD-10-CM | POA: Diagnosis not present

## 2014-02-07 LAB — LACTATE DEHYDROGENASE: LDH: 205 U/L (ref 94–250)

## 2014-02-07 LAB — CEA: CEA: 3.3 ng/mL (ref 0.0–5.0)

## 2014-02-07 LAB — CANCER ANTIGEN 19-9: CA 19 9: 24.2 U/mL (ref <35.0)

## 2014-02-07 NOTE — Telephone Encounter (Signed)
Pt is aware of weekly labs starting 6-8 he wanted mondays

## 2014-02-08 ENCOUNTER — Ambulatory Visit
Admission: RE | Admit: 2014-02-08 | Discharge: 2014-02-08 | Disposition: A | Discharge status: Home / Self Care | Payer: PRIVATE HEALTH INSURANCE | Source: Ambulatory Visit | Attending: Radiation Oncology | Admitting: Radiation Oncology

## 2014-02-08 DIAGNOSIS — Z510 Encounter for antineoplastic radiation therapy: Secondary | ICD-10-CM | POA: Diagnosis not present

## 2014-02-09 ENCOUNTER — Ambulatory Visit
Admission: RE | Admit: 2014-02-09 | Discharge: 2014-02-09 | Disposition: A | Discharge status: Home / Self Care | Payer: PRIVATE HEALTH INSURANCE | Source: Ambulatory Visit | Attending: Radiation Oncology | Admitting: Radiation Oncology

## 2014-02-09 DIAGNOSIS — Z510 Encounter for antineoplastic radiation therapy: Secondary | ICD-10-CM | POA: Diagnosis not present

## 2014-02-12 ENCOUNTER — Ambulatory Visit
Admission: RE | Admit: 2014-02-12 | Discharge: 2014-02-12 | Disposition: A | Discharge status: Home / Self Care | Payer: PRIVATE HEALTH INSURANCE | Source: Ambulatory Visit | Attending: Radiation Oncology | Admitting: Radiation Oncology

## 2014-02-12 ENCOUNTER — Other Ambulatory Visit (HOSPITAL_BASED_OUTPATIENT_CLINIC_OR_DEPARTMENT_OTHER): Payer: PRIVATE HEALTH INSURANCE

## 2014-02-12 DIAGNOSIS — C260 Malignant neoplasm of intestinal tract, part unspecified: Secondary | ICD-10-CM

## 2014-02-12 DIAGNOSIS — Z510 Encounter for antineoplastic radiation therapy: Secondary | ICD-10-CM | POA: Diagnosis not present

## 2014-02-12 DIAGNOSIS — C170 Malignant neoplasm of duodenum: Secondary | ICD-10-CM

## 2014-02-12 LAB — COMPREHENSIVE METABOLIC PANEL (CC13)
ALBUMIN: 3.4 g/dL — AB (ref 3.5–5.0)
ALT: 18 U/L (ref 0–55)
AST: 19 U/L (ref 5–34)
Alkaline Phosphatase: 127 U/L (ref 40–150)
Anion Gap: 6 mEq/L (ref 3–11)
BUN: 10.4 mg/dL (ref 7.0–26.0)
CO2: 27 mEq/L (ref 22–29)
CREATININE: 0.7 mg/dL (ref 0.7–1.3)
Calcium: 8.5 mg/dL (ref 8.4–10.4)
Chloride: 109 mEq/L (ref 98–109)
Glucose: 106 mg/dl (ref 70–140)
POTASSIUM: 4.5 meq/L (ref 3.5–5.1)
Sodium: 142 mEq/L (ref 136–145)
Total Bilirubin: 1.2 mg/dL (ref 0.20–1.20)
Total Protein: 5.5 g/dL — ABNORMAL LOW (ref 6.4–8.3)

## 2014-02-12 LAB — CBC WITH DIFFERENTIAL/PLATELET
BASO%: 0.4 % (ref 0.0–2.0)
BASOS ABS: 0 10*3/uL (ref 0.0–0.1)
EOS%: 5.7 % (ref 0.0–7.0)
Eosinophils Absolute: 0.2 10*3/uL (ref 0.0–0.5)
HCT: 33.8 % — ABNORMAL LOW (ref 38.4–49.9)
HEMOGLOBIN: 10.9 g/dL — AB (ref 13.0–17.1)
LYMPH%: 18.6 % (ref 14.0–49.0)
MCH: 31.7 pg (ref 27.2–33.4)
MCHC: 32.4 g/dL (ref 32.0–36.0)
MCV: 97.6 fL (ref 79.3–98.0)
MONO#: 0.5 10*3/uL (ref 0.1–0.9)
MONO%: 15.6 % — AB (ref 0.0–14.0)
NEUT#: 1.8 10*3/uL (ref 1.5–6.5)
NEUT%: 59.7 % (ref 39.0–75.0)
Platelets: 113 10*3/uL — ABNORMAL LOW (ref 140–400)
RBC: 3.46 10*6/uL — AB (ref 4.20–5.82)
RDW: 24.1 % — AB (ref 11.0–14.6)
WBC: 3.1 10*3/uL — ABNORMAL LOW (ref 4.0–10.3)
lymph#: 0.6 10*3/uL — ABNORMAL LOW (ref 0.9–3.3)

## 2014-02-13 ENCOUNTER — Ambulatory Visit
Admission: RE | Admit: 2014-02-13 | Discharge: 2014-02-13 | Disposition: A | Discharge status: Home / Self Care | Payer: PRIVATE HEALTH INSURANCE | Source: Ambulatory Visit | Attending: Radiation Oncology | Admitting: Radiation Oncology

## 2014-02-13 ENCOUNTER — Encounter: Payer: Self-pay | Admitting: Radiation Oncology

## 2014-02-13 VITALS — BP 115/80 | HR 97 | Temp 99.1°F | Resp 14 | Ht 67.0 in | Wt 129.3 lb

## 2014-02-13 DIAGNOSIS — C178 Malignant neoplasm of overlapping sites of small intestine: Secondary | ICD-10-CM

## 2014-02-13 DIAGNOSIS — Z510 Encounter for antineoplastic radiation therapy: Secondary | ICD-10-CM | POA: Diagnosis not present

## 2014-02-13 NOTE — Progress Notes (Signed)
  Radiation Oncology         [phone] ________________________________  Name: Keith [name] MRN: [id number]  Date: 02/13/2014  DOB: 19-Nov-1970  Weekly Radiation Therapy Management  Cancer of small intestine, including duodenum   Primary site: Small Intestine   Staging method: AJCC 7th Edition   Pathologic: Stage IIIB (T4, N2, cM0)    Summary: Stage IIIB (T4, N2, cM0)  Current Dose: 18 Gy     Planned Dose:  45 Gy  Narrative . . . . . . . . The patient presents for routine under treatment assessment.                                   The patient is without complaint.  He has a mild headache. He denies any nausea. He has noticed increase bowel sounds and some mild cramping but no diarrhea.                                 Set-up films were reviewed.                                 The chart was checked. Physical Findings. . .  height is 5\' 7"  (1.702 m) and weight is 129 lb 4.8 oz (58.65 kg). His oral temperature is 99.1 F (37.3 C). His blood pressure is 115/80 and his pulse is 97. His respiration is 14 and oxygen saturation is 99%. . The lungs are clear. The heart has regular rhythm and rate your the abdomen is soft and nontender with normal bowel sounds. Impression . . . . . . . The patient is tolerating radiation. Plan . . . . . . . . . . . . Continue treatment as planned.  ________________________________   Blair Promise, PhD, MD

## 2014-02-13 NOTE — Progress Notes (Addendum)
Pt is on 10/25 Radiation treatments.  He is currently in no pain.  Pt complains of Fatigue and currently is experiencing a headache, which started today.  Headache location is over the crown of his head.  Pt is complaining of having periods of excess gas but without cramping discomfort.  6  bowel movement(s) per week. The patient eats a regular, healthy diet..  Skin on abdomen is dry and intact with incision line healing well.  *He notice a "pea sized" round spot on his left middle forearm.  He thinks it is possible from IV therapy.  He noticed approx 2 months ago

## 2014-02-14 ENCOUNTER — Ambulatory Visit
Admission: RE | Admit: 2014-02-14 | Discharge: 2014-02-14 | Disposition: A | Discharge status: Home / Self Care | Payer: PRIVATE HEALTH INSURANCE | Source: Ambulatory Visit | Attending: Radiation Oncology | Admitting: Radiation Oncology

## 2014-02-14 DIAGNOSIS — Z510 Encounter for antineoplastic radiation therapy: Secondary | ICD-10-CM | POA: Diagnosis not present

## 2014-02-15 ENCOUNTER — Ambulatory Visit
Admission: RE | Admit: 2014-02-15 | Discharge: 2014-02-15 | Disposition: A | Discharge status: Home / Self Care | Payer: PRIVATE HEALTH INSURANCE | Source: Ambulatory Visit | Attending: Radiation Oncology | Admitting: Radiation Oncology

## 2014-02-15 DIAGNOSIS — Z510 Encounter for antineoplastic radiation therapy: Secondary | ICD-10-CM | POA: Diagnosis not present

## 2014-02-16 ENCOUNTER — Ambulatory Visit
Admission: RE | Admit: 2014-02-16 | Discharge: 2014-02-16 | Disposition: A | Discharge status: Home / Self Care | Payer: PRIVATE HEALTH INSURANCE | Source: Ambulatory Visit | Attending: Radiation Oncology | Admitting: Radiation Oncology

## 2014-02-16 DIAGNOSIS — Z510 Encounter for antineoplastic radiation therapy: Secondary | ICD-10-CM | POA: Diagnosis not present

## 2014-02-19 ENCOUNTER — Ambulatory Visit
Admission: RE | Admit: 2014-02-19 | Discharge: 2014-02-19 | Disposition: A | Discharge status: Home / Self Care | Payer: PRIVATE HEALTH INSURANCE | Source: Ambulatory Visit | Attending: Radiation Oncology | Admitting: Radiation Oncology

## 2014-02-19 ENCOUNTER — Other Ambulatory Visit (HOSPITAL_BASED_OUTPATIENT_CLINIC_OR_DEPARTMENT_OTHER): Payer: PRIVATE HEALTH INSURANCE

## 2014-02-19 DIAGNOSIS — Z510 Encounter for antineoplastic radiation therapy: Secondary | ICD-10-CM | POA: Diagnosis not present

## 2014-02-19 DIAGNOSIS — C260 Malignant neoplasm of intestinal tract, part unspecified: Secondary | ICD-10-CM

## 2014-02-19 DIAGNOSIS — C170 Malignant neoplasm of duodenum: Secondary | ICD-10-CM

## 2014-02-19 LAB — COMPREHENSIVE METABOLIC PANEL (CC13)
ALK PHOS: 113 U/L (ref 40–150)
ALT: 19 U/L (ref 0–55)
ANION GAP: 7 meq/L (ref 3–11)
AST: 21 U/L (ref 5–34)
Albumin: 3.3 g/dL — ABNORMAL LOW (ref 3.5–5.0)
BILIRUBIN TOTAL: 0.82 mg/dL (ref 0.20–1.20)
BUN: 7.1 mg/dL (ref 7.0–26.0)
CO2: 27 meq/L (ref 22–29)
Calcium: 8.4 mg/dL (ref 8.4–10.4)
Chloride: 110 mEq/L — ABNORMAL HIGH (ref 98–109)
Creatinine: 0.7 mg/dL (ref 0.7–1.3)
GLUCOSE: 115 mg/dL (ref 70–140)
Potassium: 4.2 mEq/L (ref 3.5–5.1)
Sodium: 143 mEq/L (ref 136–145)
Total Protein: 5.4 g/dL — ABNORMAL LOW (ref 6.4–8.3)

## 2014-02-19 LAB — CBC WITH DIFFERENTIAL/PLATELET
BASO%: 0.8 % (ref 0.0–2.0)
BASOS ABS: 0 10*3/uL (ref 0.0–0.1)
EOS ABS: 0.7 10*3/uL — AB (ref 0.0–0.5)
EOS%: 19.5 % — ABNORMAL HIGH (ref 0.0–7.0)
HEMATOCRIT: 33.3 % — AB (ref 38.4–49.9)
HEMOGLOBIN: 10.8 g/dL — AB (ref 13.0–17.1)
LYMPH%: 13.2 % — AB (ref 14.0–49.0)
MCH: 31.8 pg (ref 27.2–33.4)
MCHC: 32.6 g/dL (ref 32.0–36.0)
MCV: 97.5 fL (ref 79.3–98.0)
MONO#: 0.4 10*3/uL (ref 0.1–0.9)
MONO%: 11.6 % (ref 0.0–14.0)
NEUT#: 1.9 10*3/uL (ref 1.5–6.5)
NEUT%: 54.9 % (ref 39.0–75.0)
Platelets: 112 10*3/uL — ABNORMAL LOW (ref 140–400)
RBC: 3.41 10*6/uL — ABNORMAL LOW (ref 4.20–5.82)
RDW: 21.1 % — ABNORMAL HIGH (ref 11.0–14.6)
WBC: 3.5 10*3/uL — ABNORMAL LOW (ref 4.0–10.3)
lymph#: 0.5 10*3/uL — ABNORMAL LOW (ref 0.9–3.3)

## 2014-02-20 ENCOUNTER — Ambulatory Visit
Admission: RE | Admit: 2014-02-20 | Discharge: 2014-02-20 | Disposition: A | Discharge status: Home / Self Care | Payer: PRIVATE HEALTH INSURANCE | Source: Ambulatory Visit | Attending: Radiation Oncology | Admitting: Radiation Oncology

## 2014-02-20 VITALS — BP 112/78 | HR 76 | Temp 97.8°F | Resp 16 | Ht 67.0 in | Wt 131.0 lb

## 2014-02-20 DIAGNOSIS — C178 Malignant neoplasm of overlapping sites of small intestine: Secondary | ICD-10-CM

## 2014-02-20 DIAGNOSIS — Z510 Encounter for antineoplastic radiation therapy: Secondary | ICD-10-CM | POA: Diagnosis not present

## 2014-02-20 NOTE — Progress Notes (Signed)
Keith [name] has completed 15 fractions to his abdomen.  He denies pain, nausea, diarrhea and constipation.  He reports that he has gas occasionally.  He reports that his appetite is OK.  His weight is up 2 lbs from last week.  He is taking Xeloda.  His skin is intact on his abdomen.  He reports that his skin is dry in the treatment area.  Advised him to start using radiaplex.

## 2014-02-20 NOTE — Progress Notes (Signed)
  Radiation Oncology         [phone] ________________________________  Name: Keith [name] MRN: [id number]  Date: 02/20/2014  DOB: [birth year]  Weekly Radiation Therapy Management  Cancer of small intestine, including duodenum   Primary site: Small Intestine   Staging method: AJCC 7th Edition   Pathologic: Stage IIIB (T4, N2, cM0)   Summary: Stage IIIB (T4, N2, cM0)   Current Dose: 27 Gy     Planned Dose:  45 Gy  Narrative . . . . . . . . The patient presents for routine under treatment assessment.                                   The patient is without complaint. He continues to tolerate his treatments extremely well without any significant fatigue nausea or bowel issues                                 Set-up films were reviewed.                                 The chart was checked. Physical Findings. . .  height is 5\' 7"  (1.702 m) and weight is 131 lb (59.421 kg). His oral temperature is 97.8 F (36.6 C). His blood pressure is 112/78 and his pulse is 76. His respiration is 16. .  The lungs are clear. The heart has a regular rhythm and rate. The abdomen is soft and nontender with normal bowel sounds. Impression . . . . . . . The patient is tolerating radiation. Plan . . . . . . . . . . . . Continue treatment as planned.  ________________________________   Blair Promise, PhD, MD

## 2014-02-21 ENCOUNTER — Ambulatory Visit
Admission: RE | Admit: 2014-02-21 | Discharge: 2014-02-21 | Disposition: A | Discharge status: Home / Self Care | Payer: PRIVATE HEALTH INSURANCE | Source: Ambulatory Visit | Attending: Radiation Oncology | Admitting: Radiation Oncology

## 2014-02-21 DIAGNOSIS — Z510 Encounter for antineoplastic radiation therapy: Secondary | ICD-10-CM | POA: Diagnosis not present

## 2014-02-22 ENCOUNTER — Ambulatory Visit
Admission: RE | Admit: 2014-02-22 | Discharge: 2014-02-22 | Disposition: A | Discharge status: Home / Self Care | Payer: PRIVATE HEALTH INSURANCE | Source: Ambulatory Visit | Attending: Radiation Oncology | Admitting: Radiation Oncology

## 2014-02-22 DIAGNOSIS — Z510 Encounter for antineoplastic radiation therapy: Secondary | ICD-10-CM | POA: Diagnosis not present

## 2014-02-23 ENCOUNTER — Ambulatory Visit
Admission: RE | Admit: 2014-02-23 | Discharge: 2014-02-23 | Disposition: A | Discharge status: Home / Self Care | Payer: PRIVATE HEALTH INSURANCE | Source: Ambulatory Visit | Attending: Radiation Oncology | Admitting: Radiation Oncology

## 2014-02-23 DIAGNOSIS — Z510 Encounter for antineoplastic radiation therapy: Secondary | ICD-10-CM | POA: Diagnosis not present

## 2014-02-26 ENCOUNTER — Other Ambulatory Visit (HOSPITAL_BASED_OUTPATIENT_CLINIC_OR_DEPARTMENT_OTHER): Payer: PRIVATE HEALTH INSURANCE

## 2014-02-26 ENCOUNTER — Ambulatory Visit
Admission: RE | Admit: 2014-02-26 | Discharge: 2014-02-26 | Disposition: A | Discharge status: Home / Self Care | Payer: PRIVATE HEALTH INSURANCE | Source: Ambulatory Visit | Attending: Radiation Oncology | Admitting: Radiation Oncology

## 2014-02-26 ENCOUNTER — Other Ambulatory Visit: Payer: Self-pay | Admitting: Nurse Practitioner

## 2014-02-26 DIAGNOSIS — Z510 Encounter for antineoplastic radiation therapy: Secondary | ICD-10-CM | POA: Diagnosis not present

## 2014-02-26 DIAGNOSIS — C170 Malignant neoplasm of duodenum: Secondary | ICD-10-CM

## 2014-02-26 DIAGNOSIS — C260 Malignant neoplasm of intestinal tract, part unspecified: Secondary | ICD-10-CM

## 2014-02-26 LAB — CBC WITH DIFFERENTIAL/PLATELET
BASO%: 0.6 % (ref 0.0–2.0)
Basophils Absolute: 0 10*3/uL (ref 0.0–0.1)
EOS ABS: 0.6 10*3/uL — AB (ref 0.0–0.5)
EOS%: 17.2 % — ABNORMAL HIGH (ref 0.0–7.0)
HCT: 34.9 % — ABNORMAL LOW (ref 38.4–49.9)
HGB: 11.3 g/dL — ABNORMAL LOW (ref 13.0–17.1)
LYMPH#: 0.3 10*3/uL — AB (ref 0.9–3.3)
LYMPH%: 8.4 % — AB (ref 14.0–49.0)
MCH: 31.7 pg (ref 27.2–33.4)
MCHC: 32.4 g/dL (ref 32.0–36.0)
MCV: 97.8 fL (ref 79.3–98.0)
MONO#: 0.4 10*3/uL (ref 0.1–0.9)
MONO%: 12.6 % (ref 0.0–14.0)
NEUT%: 61.2 % (ref 39.0–75.0)
NEUTROS ABS: 2.1 10*3/uL (ref 1.5–6.5)
PLATELETS: 107 10*3/uL — AB (ref 140–400)
RBC: 3.57 10*6/uL — ABNORMAL LOW (ref 4.20–5.82)
RDW: 19.7 % — ABNORMAL HIGH (ref 11.0–14.6)
WBC: 3.4 10*3/uL — AB (ref 4.0–10.3)

## 2014-02-26 LAB — COMPREHENSIVE METABOLIC PANEL (CC13)
ALT: 13 U/L (ref 0–55)
ANION GAP: 7 meq/L (ref 3–11)
AST: 21 U/L (ref 5–34)
Albumin: 3.4 g/dL — ABNORMAL LOW (ref 3.5–5.0)
Alkaline Phosphatase: 114 U/L (ref 40–150)
BILIRUBIN TOTAL: 0.91 mg/dL (ref 0.20–1.20)
BUN: 11.9 mg/dL (ref 7.0–26.0)
CHLORIDE: 109 meq/L (ref 98–109)
CO2: 25 meq/L (ref 22–29)
Calcium: 8.5 mg/dL (ref 8.4–10.4)
Creatinine: 0.7 mg/dL (ref 0.7–1.3)
GLUCOSE: 126 mg/dL (ref 70–140)
Potassium: 4.2 mEq/L (ref 3.5–5.1)
Sodium: 142 mEq/L (ref 136–145)
Total Protein: 5.6 g/dL — ABNORMAL LOW (ref 6.4–8.3)

## 2014-02-27 ENCOUNTER — Ambulatory Visit
Admission: RE | Admit: 2014-02-27 | Discharge: 2014-02-27 | Disposition: A | Discharge status: Home / Self Care | Payer: PRIVATE HEALTH INSURANCE | Source: Ambulatory Visit | Attending: Radiation Oncology | Admitting: Radiation Oncology

## 2014-02-27 ENCOUNTER — Other Ambulatory Visit: Payer: Self-pay | Admitting: Nurse Practitioner

## 2014-02-27 ENCOUNTER — Encounter: Payer: Self-pay | Admitting: Radiation Oncology

## 2014-02-27 VITALS — BP 129/97 | HR 69 | Resp 16 | Wt 131.2 lb

## 2014-02-27 DIAGNOSIS — C178 Malignant neoplasm of overlapping sites of small intestine: Secondary | ICD-10-CM

## 2014-02-27 DIAGNOSIS — Z510 Encounter for antineoplastic radiation therapy: Secondary | ICD-10-CM | POA: Diagnosis not present

## 2014-02-27 NOTE — Progress Notes (Signed)
Weight stable. BP slightly elevated. Denies pain. Reports 2-3 episodes of diarrhea per day in small amounts. Reports mild fatigue. Denies nausea or vomiting.

## 2014-02-27 NOTE — Progress Notes (Signed)
Weekly Management Note Current Dose: Gy  Projected Dose: Gy   Narrative:  The patient presents for routine under treatment assessment.  CBCT/MVCT images/Port film x-rays were reviewed.  The chart was checked. Doing well. No nausea. Diarrhea 2-3 times per day. Not taking immodium. Mild fatigeu.   Physical Findings:  Unchanged  Vitals:  Filed Vitals:   02/27/14 1639  BP: 129/97  Pulse: 69  Resp: 16   Weight:  Wt Readings from Last 3 Encounters:  02/27/14 131 lb 3.2 oz (59.512 kg)  02/20/14 131 lb (59.421 kg)  02/13/14 129 lb 4.8 oz (58.65 kg)   Lab Results  Component Value Date   WBC 3.4* 02/26/2014   HGB 11.3* 02/26/2014   HCT 34.9* 02/26/2014   MCV 97.8 02/26/2014   PLT 107* 02/26/2014   Lab Results  Component Value Date   CREATININE 0.7 02/26/2014   BUN 11.9 02/26/2014   NA 142 02/26/2014   K 4.2 02/26/2014   CL 104 02/06/2014   CO2 25 02/26/2014     Impression:  The patient is tolerating radiation.  Plan:  Continue treatment as planned. Discussed diet modifications for diarrhea control and need to watch dehydration.

## 2014-02-28 ENCOUNTER — Ambulatory Visit
Admission: RE | Admit: 2014-02-28 | Discharge: 2014-02-28 | Disposition: A | Discharge status: Home / Self Care | Payer: PRIVATE HEALTH INSURANCE | Source: Ambulatory Visit | Attending: Radiation Oncology | Admitting: Radiation Oncology

## 2014-02-28 DIAGNOSIS — Z510 Encounter for antineoplastic radiation therapy: Secondary | ICD-10-CM | POA: Diagnosis not present

## 2014-03-01 ENCOUNTER — Ambulatory Visit
Admission: RE | Admit: 2014-03-01 | Discharge: 2014-03-01 | Disposition: A | Discharge status: Home / Self Care | Payer: PRIVATE HEALTH INSURANCE | Source: Ambulatory Visit | Attending: Radiation Oncology | Admitting: Radiation Oncology

## 2014-03-01 DIAGNOSIS — Z510 Encounter for antineoplastic radiation therapy: Secondary | ICD-10-CM | POA: Diagnosis not present

## 2014-03-02 ENCOUNTER — Ambulatory Visit
Admission: RE | Admit: 2014-03-02 | Discharge: 2014-03-02 | Disposition: A | Discharge status: Home / Self Care | Payer: PRIVATE HEALTH INSURANCE | Source: Ambulatory Visit | Attending: Radiation Oncology | Admitting: Radiation Oncology

## 2014-03-02 DIAGNOSIS — Z510 Encounter for antineoplastic radiation therapy: Secondary | ICD-10-CM | POA: Diagnosis not present

## 2014-03-05 ENCOUNTER — Telehealth: Payer: Self-pay | Admitting: Dietician

## 2014-03-05 ENCOUNTER — Other Ambulatory Visit (HOSPITAL_BASED_OUTPATIENT_CLINIC_OR_DEPARTMENT_OTHER): Payer: PRIVATE HEALTH INSURANCE

## 2014-03-05 ENCOUNTER — Ambulatory Visit
Admission: RE | Admit: 2014-03-05 | Discharge: 2014-03-05 | Disposition: A | Discharge status: Home / Self Care | Payer: PRIVATE HEALTH INSURANCE | Source: Ambulatory Visit | Attending: Radiation Oncology | Admitting: Radiation Oncology

## 2014-03-05 DIAGNOSIS — C260 Malignant neoplasm of intestinal tract, part unspecified: Secondary | ICD-10-CM

## 2014-03-05 DIAGNOSIS — C170 Malignant neoplasm of duodenum: Secondary | ICD-10-CM

## 2014-03-05 DIAGNOSIS — Z510 Encounter for antineoplastic radiation therapy: Secondary | ICD-10-CM | POA: Diagnosis not present

## 2014-03-05 LAB — COMPREHENSIVE METABOLIC PANEL
ALT: 15 U/L (ref 0–53)
AST: 24 U/L (ref 0–37)
Albumin: 3.3 g/dL — ABNORMAL LOW (ref 3.5–5.2)
Alkaline Phosphatase: 114 U/L (ref 39–117)
BUN: 10 mg/dL (ref 6–23)
CALCIUM: 8.5 mg/dL (ref 8.4–10.5)
CO2: 27 meq/L (ref 19–32)
CREATININE: 0.84 mg/dL (ref 0.50–1.35)
Chloride: 105 mEq/L (ref 96–112)
Glucose, Bld: 103 mg/dL — ABNORMAL HIGH (ref 70–99)
POTASSIUM: 4.5 meq/L (ref 3.5–5.3)
SODIUM: 142 meq/L (ref 135–145)
TOTAL PROTEIN: 5.7 g/dL — AB (ref 6.0–8.3)
Total Bilirubin: 0.6 mg/dL (ref 0.2–1.2)

## 2014-03-05 LAB — CBC WITH DIFFERENTIAL/PLATELET
BASO%: 0.4 % (ref 0.0–2.0)
Basophils Absolute: 0 10*3/uL (ref 0.0–0.1)
EOS%: 16.9 % — ABNORMAL HIGH (ref 0.0–7.0)
Eosinophils Absolute: 0.4 10*3/uL (ref 0.0–0.5)
HCT: 35.1 % — ABNORMAL LOW (ref 38.4–49.9)
HGB: 11.4 g/dL — ABNORMAL LOW (ref 13.0–17.1)
LYMPH#: 0.2 10*3/uL — AB (ref 0.9–3.3)
LYMPH%: 9.3 % — ABNORMAL LOW (ref 14.0–49.0)
MCH: 31.6 pg (ref 27.2–33.4)
MCHC: 32.4 g/dL (ref 32.0–36.0)
MCV: 97.4 fL (ref 79.3–98.0)
MONO#: 0.4 10*3/uL (ref 0.1–0.9)
MONO%: 17.5 % — ABNORMAL HIGH (ref 0.0–14.0)
NEUT#: 1.4 10*3/uL — ABNORMAL LOW (ref 1.5–6.5)
NEUT%: 55.9 % (ref 39.0–75.0)
Platelets: 101 10*3/uL — ABNORMAL LOW (ref 140–400)
RBC: 3.6 10*6/uL — AB (ref 4.20–5.82)
RDW: 18.4 % — AB (ref 11.0–14.6)
WBC: 2.5 10*3/uL — AB (ref 4.0–10.3)

## 2014-03-05 NOTE — Telephone Encounter (Signed)
Brief Outpatient Oncology Nutrition Note  Patient has been identified to be at risk on malnutrition screen.  Wt Readings from Last 10 Encounters:  02/27/14 131 lb 3.2 oz (59.512 kg)  02/20/14 131 lb (59.421 kg)  02/13/14 129 lb 4.8 oz (58.65 kg)  02/06/14 129 lb 1.6 oz (58.559 kg)  02/06/14 127 lb (57.607 kg)  01/18/14 126 lb 6.4 oz (57.335 kg)  01/08/14 133 lb (60.328 kg)  12/18/13 127 lb (57.607 kg)  11/27/13 123 lb (55.792 kg)  10/26/13 116 lb (52.617 kg)   Dx:  Stage IIIc adenocarcinoma of the small bowel .  Patient of Dr. Marin Olp.  Patient reports that his appetite and intake are god.  Reports that current weight without clothes has remained stable at about 125 lbs for the past month.  Discussed importance of weight maintenance.  Patient to call for any nutrition related needs.  Antonieta Iba, RD, LDN

## 2014-03-06 ENCOUNTER — Ambulatory Visit
Admission: RE | Admit: 2014-03-06 | Discharge: 2014-03-06 | Disposition: A | Discharge status: Home / Self Care | Payer: PRIVATE HEALTH INSURANCE | Source: Ambulatory Visit | Attending: Radiation Oncology | Admitting: Radiation Oncology

## 2014-03-06 ENCOUNTER — Encounter: Payer: Self-pay | Admitting: Radiation Oncology

## 2014-03-06 VITALS — BP 126/87 | HR 69 | Temp 98.1°F | Ht 67.0 in | Wt 130.8 lb

## 2014-03-06 DIAGNOSIS — C178 Malignant neoplasm of overlapping sites of small intestine: Secondary | ICD-10-CM

## 2014-03-06 DIAGNOSIS — Z510 Encounter for antineoplastic radiation therapy: Secondary | ICD-10-CM | POA: Diagnosis not present

## 2014-03-06 NOTE — Progress Notes (Signed)
Keith [name] has completed treatment with 25 fractions to his abdomen.  He denies pain and nausea.  He reports that he has one loose stool per day.  He has been taking xeloda and today is his last day.  The skin on his abdomen is pink.  He has not started using radiaplex yet.  He has a follow up appointment on July 30 with Dr. Sondra Come.

## 2014-03-06 NOTE — Progress Notes (Signed)
  Radiation Oncology         (336) [phone] ________________________________  Name: Keith [name] MRN: [id number]  Date: 03/06/2014  DOB: 07/09/1971  Weekly Radiation Therapy Management  Cancer of small intestine, including duodenum   Primary site: Small Intestine   Staging method: AJCC 7th Edition   Pathologic: Stage IIIB (T4, N2, cM0)    Summary: Stage IIIB (T4, N2, cM0)   Current Dose: 45 Gy     Planned Dose:  45 Gy  Narrative . . . . . . . . The patient presents for routine under treatment assessment.                                   The patient is without complaint.  He has tolerated the radiation therapy quite well.                                 Set-up films were reviewed.                                 The chart was checked. Physical Findings. . .  height is 5\' 7"  (1.702 m) and weight is 130 lb 12.8 oz (59.33 kg). His oral temperature is 98.1 F (36.7 C). His blood pressure is 126/87 and his pulse is 69. . The lungs are clear. The heart has regular rhythm and rate. The abdomen is soft and nontender with normal bowel sounds. Impression . . . . . . . The patient  tolerated radiation very well. Plan . . . . . . . . . . . [name] routine followup in one month  ________________________________   Blair Promise, PhD, MD

## 2014-03-07 ENCOUNTER — Ambulatory Visit: Payer: PRIVATE HEALTH INSURANCE

## 2014-03-08 ENCOUNTER — Encounter: Payer: Self-pay | Admitting: Hematology & Oncology

## 2014-03-08 ENCOUNTER — Ambulatory Visit (HOSPITAL_BASED_OUTPATIENT_CLINIC_OR_DEPARTMENT_OTHER): Payer: PRIVATE HEALTH INSURANCE | Admitting: Hematology & Oncology

## 2014-03-08 ENCOUNTER — Ambulatory Visit: Payer: PRIVATE HEALTH INSURANCE

## 2014-03-08 ENCOUNTER — Other Ambulatory Visit: Payer: PRIVATE HEALTH INSURANCE | Admitting: Lab

## 2014-03-08 VITALS — BP 127/88 | HR 68 | Temp 97.4°F | Resp 16 | Ht 67.0 in | Wt 130.0 lb

## 2014-03-08 DIAGNOSIS — C178 Malignant neoplasm of overlapping sites of small intestine: Secondary | ICD-10-CM

## 2014-03-08 DIAGNOSIS — C179 Malignant neoplasm of small intestine, unspecified: Secondary | ICD-10-CM

## 2014-03-12 ENCOUNTER — Ambulatory Visit: Payer: PRIVATE HEALTH INSURANCE

## 2014-03-12 NOTE — Progress Notes (Signed)
Hematology and Oncology Follow Up Visit  Keith [name] [id number] [birth year] 43 y.o. 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 Patient has completed concurrent radiation therapy and Xeloda     Interim History:  Mr.  Keith [name] is back for followup. He'll through good. He got through concurrent radiation therapy and Xeloda with very little complications.  Overall, he has been doing well. [name] had no nausea or vomiting.  [name] had no pain. There's been no diarrhea. [name] had no cough. There's been no rash. [name] had no leg swelling. [name] had no weakness.  Overall, his performance status is ECOG 1  Medications: Current outpatient prescriptions:megestrol (MEGACE) 20 MG tablet, Take 20 mg by mouth daily as needed., Disp: , Rfl: ;  Pancrelipase, Lip-Prot-Amyl, (CREON) 36000 UNITS CPEP, Take 1 capsule by mouth 3 (three) times daily with meals., Disp: 90 capsule, Rfl: 3;  capecitabine (XELODA) 500 MG tablet, Take 3 pills a day throughout radiation.Do NOT take any on Saturday or Sunday, Disp: 90 tablet, Rfl: 0 dexamethasone (DECADRON) 4 MG tablet, Take 2 tablets (8 mg total) by mouth 2 (two) times daily with a meal. Start the day after chemotherapy for 2 days. Take with food., Disp: 64 tablet, Rfl: 1;  dronabinol (MARINOL) 2.5 MG capsule, Take 2.5 mg by mouth as needed., Disp: , Rfl: ;  ondansetron (ZOFRAN) 8 MG tablet, Take 8 mg by mouth 2 (two) times daily. Start the day after chemo for 2 days., Disp: , Rfl:  prochlorperazine (COMPAZINE) 10 MG tablet, Take 1 tablet (10 mg total) by mouth every 6 (six) hours as needed (Nausea or vomiting)., Disp: 60 tablet, Rfl: 3  Allergies: No Known Allergies  Past Medical History, Surgical history, Social history, and Family History were reviewed and updated.  Review of Systems: As above  Physical Exam:  height is 5\' 7"  (1.702 m) and weight is 130 lb (58.968 kg). His oral  temperature is 97.4 F (36.3 C). His blood pressure is 127/88 and his pulse is 68. His respiration is 16.   Fairly well-developed and well-nourished Asian gentleman in no obvious distress. Head and neck exam shows no ocular or oral lesions. He has no palpable cervical or supraclavicular lymph nodes. Lungs are clear bilaterally. Cardiac exam regular in rhythm with no murmurs rubs or bruits. Abdomen is soft. He has well-healed laparotomy scars. He has good bowel sounds. There is no fluid wave. There is no palpable liver  or spleen tip. Back exam shows no tenderness over the spine ribs or hips. Extremities shows no clubbing cyanosis or edema. Has good strength. Skin exam no rashes. Neurological exam is nonfocal. Lab Results  Component Value Date   WBC 2.5* 03/05/2014   HGB 11.4* 03/05/2014   HCT 35.1* 03/05/2014   MCV 97.4 03/05/2014   PLT 101* 03/05/2014     Chemistry      Component Value Date/Time   NA 142 03/05/2014 1631   NA 142 02/26/2014 1555   NA 141 02/06/2014 1135   K 4.5 03/05/2014 1631   K 4.2 02/26/2014 1555   K 4.1 02/06/2014 1135   CL 105 03/05/2014 1631   CL 104 02/06/2014 1135   CO2 27 03/05/2014 1631   CO2 25 02/26/2014 1555   CO2 29 02/06/2014 1135   BUN 10 03/05/2014 1631   BUN 11.9 02/26/2014 1555   BUN 12 02/06/2014 1135   CREATININE 0.84 03/05/2014 1631  CREATININE 0.7 02/26/2014 1555   CREATININE 0.8 02/06/2014 1135      Component Value Date/Time   CALCIUM 8.5 03/05/2014 1631   CALCIUM 8.5 02/26/2014 1555   CALCIUM 8.6 02/06/2014 1135   ALKPHOS 114 03/05/2014 1631   ALKPHOS 114 02/26/2014 1555   ALKPHOS 117* 02/06/2014 1135   AST 24 03/05/2014 1631   AST 21 02/26/2014 1555   AST 30 02/06/2014 1135   ALT 15 03/05/2014 1631   ALT 13 02/26/2014 1555   ALT 26 02/06/2014 1135   BILITOT 0.6 03/05/2014 1631   BILITOT 0.91 02/26/2014 1555   BILITOT 2.00* 02/06/2014 1135         Impression and Plan: [name] is 43 year old with locally advanced adenocarcinoma small bowel. He underwent resection.  He had up front chemotherapy with Xeloda and oxaliplatin. He then had radiation and chemotherapy with low-dose Xeloda. Now we will proceed with his final phase of at therapy with full dose Xeloda and oxaliplatin.  I will rescan him. I'll do this in about one month.  I probably will start his next phase of therapy in 6 weeks. I will go with 4 cycles. Abdomen is full code be adequate.  He has done very well so far. [name] tolerated treatment much better than I would've thought. I am very happy for him.     Volanda Napoleon, MD 7/6/20157:08 AM

## 2014-03-13 ENCOUNTER — Encounter: Payer: Self-pay | Admitting: Radiation Oncology

## 2014-03-13 ENCOUNTER — Ambulatory Visit: Payer: PRIVATE HEALTH INSURANCE

## 2014-03-13 NOTE — Progress Notes (Signed)
  Radiation Oncology         [phone] ________________________________  Name: Keith [name] MRN: [id number]  Date: 03/13/2014  DOB: [birth year]  End of Treatment Note  Diagnosis:  Cancer of small intestine, including duodenum   Primary site: Small Intestine   Staging method: AJCC 7th Edition   Pathologic: Stage IIIB (T4, N2, cM0)    Summary: Stage IIIB (T4, N2, cM0)     Indication for treatment:  Postop, adjuvant, high risk for recurrence       Radiation treatment dates:   May 27 through June 30  Site/dose:   Upper abdomen/surgical bed  Beams/energy:   Intensity modulated radiation therapy, 2 rapid arcs, 6 MV photons  Narrative: The patient tolerated radiation treatment relatively well.   He had some mild fatigue but no nausea or bowel issues  Plan: The patient has completed radiation treatment. The patient will return to radiation oncology clinic for routine followup in one month. I advised them to call or return sooner if they have any questions or concerns related to their recovery or treatment.  -----------------------------------  Blair Promise, PhD, MD

## 2014-04-02 ENCOUNTER — Encounter: Payer: Self-pay | Admitting: Oncology

## 2014-04-04 ENCOUNTER — Encounter: Payer: Self-pay | Admitting: Nurse Practitioner

## 2014-04-04 NOTE — Progress Notes (Signed)
All medical records and treatment synopsis as requested to [facility] [phone]

## 2014-04-05 ENCOUNTER — Ambulatory Visit (HOSPITAL_BASED_OUTPATIENT_CLINIC_OR_DEPARTMENT_OTHER)
Admission: RE | Admit: 2014-04-05 | Discharge: 2014-04-05 | Disposition: A | Discharge status: Home / Self Care | Payer: PRIVATE HEALTH INSURANCE | Source: Ambulatory Visit | Attending: Hematology & Oncology | Admitting: Hematology & Oncology

## 2014-04-05 ENCOUNTER — Other Ambulatory Visit (HOSPITAL_BASED_OUTPATIENT_CLINIC_OR_DEPARTMENT_OTHER): Payer: PRIVATE HEALTH INSURANCE | Admitting: Lab

## 2014-04-05 ENCOUNTER — Ambulatory Visit
Admission: RE | Admit: 2014-04-05 | Discharge: 2014-04-05 | Disposition: A | Discharge status: Home / Self Care | Payer: PRIVATE HEALTH INSURANCE | Source: Ambulatory Visit | Attending: Radiation Oncology | Admitting: Radiation Oncology

## 2014-04-05 ENCOUNTER — Encounter: Payer: Self-pay | Admitting: Radiation Oncology

## 2014-04-05 ENCOUNTER — Encounter (HOSPITAL_BASED_OUTPATIENT_CLINIC_OR_DEPARTMENT_OTHER): Payer: Self-pay

## 2014-04-05 ENCOUNTER — Ambulatory Visit (HOSPITAL_BASED_OUTPATIENT_CLINIC_OR_DEPARTMENT_OTHER): Payer: PRIVATE HEALTH INSURANCE | Admitting: Hematology & Oncology

## 2014-04-05 ENCOUNTER — Encounter: Payer: Self-pay | Admitting: Hematology & Oncology

## 2014-04-05 VITALS — BP 135/96 | HR 65 | Temp 97.9°F | Resp 16 | Ht 67.0 in | Wt 130.0 lb

## 2014-04-05 VITALS — BP 130/93 | HR 72 | Temp 97.5°F | Ht 67.0 in | Wt 130.0 lb

## 2014-04-05 DIAGNOSIS — M799 Soft tissue disorder, unspecified: Secondary | ICD-10-CM | POA: Insufficient documentation

## 2014-04-05 DIAGNOSIS — Z90411 Acquired partial absence of pancreas: Secondary | ICD-10-CM | POA: Insufficient documentation

## 2014-04-05 DIAGNOSIS — C178 Malignant neoplasm of overlapping sites of small intestine: Secondary | ICD-10-CM

## 2014-04-05 DIAGNOSIS — Z923 Personal history of irradiation: Secondary | ICD-10-CM | POA: Insufficient documentation

## 2014-04-05 DIAGNOSIS — M47817 Spondylosis without myelopathy or radiculopathy, lumbosacral region: Secondary | ICD-10-CM | POA: Insufficient documentation

## 2014-04-05 DIAGNOSIS — Z9089 Acquired absence of other organs: Secondary | ICD-10-CM | POA: Insufficient documentation

## 2014-04-05 DIAGNOSIS — C179 Malignant neoplasm of small intestine, unspecified: Secondary | ICD-10-CM

## 2014-04-05 DIAGNOSIS — Z9221 Personal history of antineoplastic chemotherapy: Secondary | ICD-10-CM | POA: Insufficient documentation

## 2014-04-05 DIAGNOSIS — Z903 Acquired absence of stomach [part of]: Secondary | ICD-10-CM | POA: Insufficient documentation

## 2014-04-05 DIAGNOSIS — J984 Other disorders of lung: Secondary | ICD-10-CM | POA: Insufficient documentation

## 2014-04-05 LAB — CBC WITH DIFFERENTIAL (CANCER CENTER ONLY)
BASO#: 0 10*3/uL (ref 0.0–0.2)
BASO%: 0.4 % (ref 0.0–2.0)
EOS%: 3.6 % (ref 0.0–7.0)
Eosinophils Absolute: 0.1 10*3/uL (ref 0.0–0.5)
HCT: 33.9 % — ABNORMAL LOW (ref 38.7–49.9)
HEMOGLOBIN: 11.6 g/dL — AB (ref 13.0–17.1)
LYMPH#: 0.7 10*3/uL — ABNORMAL LOW (ref 0.9–3.3)
LYMPH%: 24.2 % (ref 14.0–48.0)
MCH: 31 pg (ref 28.0–33.4)
MCHC: 34.2 g/dL (ref 32.0–35.9)
MCV: 91 fL (ref 82–98)
MONO#: 0.3 10*3/uL (ref 0.1–0.9)
MONO%: 9.6 % (ref 0.0–13.0)
NEUT#: 1.8 10*3/uL (ref 1.5–6.5)
NEUT%: 62.2 % (ref 40.0–80.0)
Platelets: 106 10*3/uL — ABNORMAL LOW (ref 145–400)
RBC: 3.74 10*6/uL — ABNORMAL LOW (ref 4.20–5.70)
RDW: 11.9 % (ref 11.1–15.7)
WBC: 2.8 10*3/uL — ABNORMAL LOW (ref 4.0–10.0)

## 2014-04-05 LAB — CMP (CANCER CENTER ONLY)
ALK PHOS: 124 U/L — AB (ref 26–84)
ALT(SGPT): 30 U/L (ref 10–47)
AST: 33 U/L (ref 11–38)
Albumin: 3.8 g/dL (ref 3.3–5.5)
BUN: 7 mg/dL (ref 7–22)
CO2: 28 mEq/L (ref 18–33)
Calcium: 8.5 mg/dL (ref 8.0–10.3)
Chloride: 102 mEq/L (ref 98–108)
Creat: 1 mg/dl (ref 0.6–1.2)
GLUCOSE: 86 mg/dL (ref 73–118)
Potassium: 4.2 mEq/L (ref 3.3–4.7)
Sodium: 141 mEq/L (ref 128–145)
Total Bilirubin: 0.9 mg/dl (ref 0.20–1.60)
Total Protein: 6.1 g/dL — ABNORMAL LOW (ref 6.4–8.1)

## 2014-04-05 LAB — LACTATE DEHYDROGENASE: LDH: 170 U/L (ref 94–250)

## 2014-04-05 LAB — CEA: CEA: 2.3 ng/mL (ref 0.0–5.0)

## 2014-04-05 MED ORDER — IOHEXOL 300 MG/ML  SOLN
100.0000 mL | Freq: Once | INTRAMUSCULAR | Status: AC | PRN
  Administered 2014-04-05: 100 mL via INTRAVENOUS

## 2014-04-05 MED ORDER — CAPECITABINE 500 MG PO TABS: ORAL_TABLET | ORAL | Status: DC

## 2014-04-05 NOTE — Progress Notes (Signed)
Radiation Oncology         [phone]) [phone] ________________________________  Name: Keith [name] MRN: [id number]  Date: 04/05/2014  DOB: 03/17/1971  Follow-Up Visit Note  CC: Ival Bible, MD  Volanda Napoleon, MD  Diagnosis:   Cancer of small intestine, including duodenum   Primary site: Small Intestine   Staging method: AJCC 7th Edition   Pathologic: Stage IIIB (T4, N2, cM0) signed by Volanda Napoleon, MD on 10/26/2013  3:17 PM   Summary: Stage IIIB (T4, N2, cM0)   Interval Since Last Radiation:  1  months  Narrative:  The patient returns today for routine follow-up.  He is doing well at this time. His energy level and appetite has improved significantly. Patient did undergo repeat CT scan earlier today which showed no active disease possibly some swelling from his recent radiation therapy.  He continues on chemotherapy                              ALLERGIES:  has No Known Allergies.  Meds: Current Outpatient Prescriptions  Medication Sig Dispense Refill  . megestrol (MEGACE) 20 MG tablet Take 20 mg by mouth daily as needed.      . Pancrelipase, Lip-Prot-Amyl, (CREON) 36000 UNITS CPEP Take 1 capsule by mouth 3 (three) times daily with meals.  90 capsule  3  . capecitabine (XELODA) 500 MG tablet Take 3 pills 2 times a day for 14 days.  84 tablet  3  . dronabinol (MARINOL) 2.5 MG capsule Take 2.5 mg by mouth as needed.      . ondansetron (ZOFRAN) 8 MG tablet Take 8 mg by mouth 2 (two) times daily. Start the day after chemo for 2 days.       No current facility-administered medications for this encounter.    Physical Findings: The patient is in no acute distress. Patient is alert and oriented.  height is 5\' 7"  (1.702 m) and weight is 130 lb (58.968 kg). His oral temperature is 97.5 F (36.4 C). His blood pressure is 130/93 and his pulse is 72. .  The lungs are clear. The heart has a regular rhythm and rate. Abdomen is soft and nontender with normal bowel sounds. Patient has mild  hyperpigmentation changes in the upper abdominal area.  Lab Findings: Lab Results  Component Value Date   WBC 2.8* 04/05/2014   HGB 11.6* 04/05/2014   HCT 33.9* 04/05/2014   MCV 91 04/05/2014   PLT 106* 04/05/2014      Radiographic Findings: Ct Chest W Contrast  04/05/2014   CLINICAL DATA:  Small bowel cancer, status post radiation and chemotherapy  EXAM: CT CHEST, ABDOMEN, AND PELVIS WITH CONTRAST  TECHNIQUE: Multidetector CT imaging of the chest, abdomen and pelvis was performed following the standard protocol during bolus administration of intravenous contrast.  CONTRAST:  126mL OMNIPAQUE IOHEXOL 300 MG/ML  SOLN  COMPARISON:  01/22/2014  FINDINGS: CT CHEST FINDINGS  Faint 4 mm ground-glass nodular opacity in the right upper lobe (series 4/image 13), unchanged, although not suspicious for metastatic disease. No new/suspicious pulmonary nodules. No pleural effusion or pneumothorax.  Visualized thyroid is unremarkable.  The heart is normal in size.  No pericardial effusion.  No suspicious mediastinal, hilar, or axillary lymphadenopathy.  Visualized osseous structures are within normal limits.  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  Mild irregular soft tissue in the porta hepatis, surrounding a mildly narrow common hepatic artery (series 2/image 61), possibly postsurgical although mildly increased anteriorly when compared to prior studies.  Liver, spleen, and adrenal glands are within normal limits.  Visualized pancreas is unremarkable.  Kidneys are within normal limits.  No hydronephrosis.  Visualized bowel in is within normal limits. No evidence of bowel obstruction. Patent gastrojejunostomy.  No evidence of abdominal aortic aneurysm.  No abdominopelvic ascites.  No suspicious abdominopelvic lymphadenopathy.   Prostate is unremarkable.  Bladder is within normal limits.  Mild degenerative changes of the lumbar spine.  IMPRESSION: Postsurgical changes in the upper abdomen, as above.  Mild irregular soft tissue in the porta hepatis, possibly postsurgical, although mildly increased. Close attention on follow-up is suggested to exclude residual/recurrent tumor.  Otherwise, no findings to suggest metastatic disease.   Electronically Signed   By: Julian Hy M.D.   On: 04/05/2014 14:09   Ct Abdomen Pelvis W Contrast  04/05/2014   CLINICAL DATA:  Small bowel cancer, status post radiation and chemotherapy  EXAM: CT CHEST, ABDOMEN, AND PELVIS WITH CONTRAST  TECHNIQUE: Multidetector CT imaging of the chest, abdomen and pelvis was performed following the standard protocol during bolus administration of intravenous contrast.  CONTRAST:  139mL OMNIPAQUE IOHEXOL 300 MG/ML  SOLN  COMPARISON:  01/22/2014  FINDINGS: CT CHEST FINDINGS  Faint 4 mm ground-glass nodular opacity in the right upper lobe (series 4/image 13), unchanged, although not suspicious for metastatic disease. No new/suspicious pulmonary nodules. No pleural effusion or pneumothorax.  Visualized thyroid is unremarkable.  The heart is normal in size.  No pericardial effusion.  No suspicious mediastinal, hilar, or axillary lymphadenopathy.  Visualized osseous structures are within normal limits.  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  Mild irregular soft tissue in the porta hepatis, surrounding a mildly narrow common hepatic artery (series 2/image 61), possibly postsurgical although mildly increased anteriorly when compared to prior studies.  Liver, spleen, and adrenal glands are within normal limits.  Visualized pancreas is unremarkable.  Kidneys are within normal limits.   No hydronephrosis.  Visualized bowel in is within normal limits. No evidence of bowel obstruction. Patent gastrojejunostomy.  No evidence of abdominal aortic aneurysm.  No abdominopelvic ascites.  No suspicious abdominopelvic lymphadenopathy.  Prostate is unremarkable.  Bladder is within normal limits.  Mild degenerative changes of the lumbar spine.  IMPRESSION: Postsurgical changes in the upper abdomen, as above.  Mild irregular soft tissue in the porta hepatis, possibly postsurgical, although mildly increased. Close attention on follow-up is suggested to exclude residual/recurrent tumor.  Otherwise, no findings to suggest metastatic disease.   Electronically Signed   By: Julian Hy M.D.   On: 04/05/2014 14:09    Impression:  The patient is recovering from the effects of radiation.  No active disease on recent scans.  Plan:  When necessary followup in ration oncology. Patient will continue close followup in medical oncology.  ____________________________________ Blair Promise, MD

## 2014-04-05 NOTE — Progress Notes (Signed)
Keith [name] here for follow up after treatment to his upper abdomen.  He denies pain and nausea.  He reports his appetite is good.  He had an appointment with Dr. Marin Olp today.  His weight is stable.  The skin on his abdomen has hyperpigmentation.

## 2014-04-05 NOTE — Progress Notes (Signed)
Hematology and Oncology Follow Up Visit  Keith [name] [id number] [birth year] 43 y.o. 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 Patient has completed concurrent radiation therapy and Xeloda     Interim History:  Keith [name] is back for followup.he is feeling good. He has gone through the radiation and chemotherapy. He has no abdominal pain. He has no nausea or vomiting. He has no diarrhea. He is taking some pancreatic enzyme replacement.  He really is not on any other medications.  We did go ahead and get a CT scan of his body today. There is no evidence of recurrent disease. He has some slight thickness in the peri-pancreatic region. I suspect this might be from radiation.  He has not had any cough. [name] had no rashes. There's been no leg swelling. [name] had no headache.   Overall, his performance status is ECOG 1  Medications: Current outpatient prescriptions:Pancrelipase, Lip-Prot-Amyl, (CREON) 36000 UNITS CPEP, Take 1 capsule by mouth 3 (three) times daily with meals., Disp: 90 capsule, Rfl: 3;  capecitabine (XELODA) 500 MG tablet, Take 3 pills 2 times a day for 14 days., Disp: 84 tablet, Rfl: 3;  dronabinol (MARINOL) 2.5 MG capsule, Take 2.5 mg by mouth as needed., Disp: , Rfl: ;  megestrol (MEGACE) 20 MG tablet, Take 20 mg by mouth daily as needed., Disp: , Rfl:  ondansetron (ZOFRAN) 8 MG tablet, Take 8 mg by mouth 2 (two) times daily. Start the day after chemo for 2 days., Disp: , Rfl:   Allergies: No Known Allergies  Past Medical History, Surgical history, Social history, and Family History were reviewed and updated.  Review of Systems: As above  Physical Exam:  height is 5\' 7"  (1.702 m) and weight is 130 lb (58.968 kg). His oral temperature is 97.9 F (36.6 C). His blood pressure is 135/96 and his pulse is 65. His respiration is 16.   Fairly well-developed and well-nourished Asian  gentleman in no obvious distress. Head and neck exam shows no ocular or oral lesions. He has no palpable cervical or supraclavicular lymph nodes. Lungs are clear bilaterally. Cardiac exam regular in rhythm with no murmurs rubs or bruits. Abdomen is soft. He has well-healed laparotomy scars. He has good bowel sounds. There is no fluid wave. There is no palpable liver  or spleen tip. Back exam shows no tenderness over the spine ribs or hips. Extremities shows no clubbing cyanosis or edema. Has good strength. Skin exam no rashes. Neurological exam is nonfocal. Lab Results  Component Value Date   WBC 2.8* 04/05/2014   HGB 11.6* 04/05/2014   HCT 33.9* 04/05/2014   MCV 91 04/05/2014   PLT 106* 04/05/2014     Chemistry      Component Value Date/Time   NA 141 04/05/2014 1354   NA 142 03/05/2014 1631   NA 142 02/26/2014 1555   K 4.2 04/05/2014 1354   K 4.5 03/05/2014 1631   K 4.2 02/26/2014 1555   CL 102 04/05/2014 1354   CL 105 03/05/2014 1631   CO2 28 04/05/2014 1354   CO2 27 03/05/2014 1631   CO2 25 02/26/2014 1555   BUN 7 04/05/2014 1354   BUN 10 03/05/2014 1631   BUN 11.9 02/26/2014 1555   CREATININE 1.0 04/05/2014 1354   CREATININE 0.84 03/05/2014 1631   CREATININE 0.7 02/26/2014 1555      Component Value Date/Time  CALCIUM 8.5 04/05/2014 1354   CALCIUM 8.5 03/05/2014 1631   CALCIUM 8.5 02/26/2014 1555   ALKPHOS 124* 04/05/2014 1354   ALKPHOS 114 03/05/2014 1631   ALKPHOS 114 02/26/2014 1555   AST 33 04/05/2014 1354   AST 24 03/05/2014 1631   AST 21 02/26/2014 1555   ALT 30 04/05/2014 1354   ALT 15 03/05/2014 1631   ALT 13 02/26/2014 1555   BILITOT 0.90 04/05/2014 1354   BILITOT 0.6 03/05/2014 1631   BILITOT 0.91 02/26/2014 1555         Impression and Plan: Keith [name] is 43 year old with locally advanced adenocarcinoma small bowel. He underwent resection. He had up front chemotherapy with Xeloda and oxaliplatin. He then had radiation and chemotherapy with low-dose Xeloda. Now we will proceed with his  final phase of at therapy with full dose Xeloda and oxaliplatin.  We will go ahead and start next week. The CT scan is reassuring.  He will take 1500 mg twice a day of Xeloda for 14 days out of 21. The oxaliplatin will be once every 3 weeks.  I am just thankful that he is done so well.  We will see him back for his second cycle of treatment. He will have a total of 4 cycles.  I went over the toxicities of treatment again. He is very well aware of the side effects since she has had this already.   Volanda Napoleon, MD 7/30/20153:43 PM

## 2014-04-09 ENCOUNTER — Ambulatory Visit (HOSPITAL_BASED_OUTPATIENT_CLINIC_OR_DEPARTMENT_OTHER): Payer: PRIVATE HEALTH INSURANCE

## 2014-04-09 DIAGNOSIS — C178 Malignant neoplasm of overlapping sites of small intestine: Secondary | ICD-10-CM

## 2014-04-09 DIAGNOSIS — C179 Malignant neoplasm of small intestine, unspecified: Secondary | ICD-10-CM

## 2014-04-09 DIAGNOSIS — Z5111 Encounter for antineoplastic chemotherapy: Secondary | ICD-10-CM

## 2014-04-09 MED ORDER — OXALIPLATIN CHEMO INJECTION 100 MG/20ML
130.0000 mg/m2 | Freq: Once | INTRAVENOUS | Status: AC
  Administered 2014-04-09: 215 mg via INTRAVENOUS
  Filled 2014-04-09: qty 43

## 2014-04-09 MED ORDER — PROCHLORPERAZINE MALEATE 10 MG PO TABS: 10.0000 mg | ORAL_TABLET | Freq: Four times a day (QID) | ORAL | Status: DC | PRN

## 2014-04-09 MED ORDER — DEXAMETHASONE 4 MG PO TABS: 8.0000 mg | ORAL_TABLET | Freq: Two times a day (BID) | ORAL | Status: DC

## 2014-04-09 MED ORDER — ONDANSETRON HCL 8 MG PO TABS: 8.0000 mg | ORAL_TABLET | Freq: Two times a day (BID) | ORAL | Status: DC

## 2014-04-09 MED ORDER — DEXTROSE 5 % IV SOLN
Freq: Once | INTRAVENOUS | Status: AC
  Administered 2014-04-09: 10:00:00 via INTRAVENOUS

## 2014-04-09 MED ORDER — DEXAMETHASONE SODIUM PHOSPHATE 10 MG/ML IJ SOLN
10.0000 mg | Freq: Once | INTRAMUSCULAR | Status: AC
  Administered 2014-04-09: 10 mg via INTRAVENOUS

## 2014-04-09 MED ORDER — ONDANSETRON 8 MG/50ML IVPB (CHCC)
8.0000 mg | Freq: Once | INTRAVENOUS | Status: AC
  Administered 2014-04-09: 8 mg via INTRAVENOUS

## 2014-04-09 MED ORDER — LORAZEPAM 0.5 MG PO TABS: 0.5000 mg | ORAL_TABLET | Freq: Four times a day (QID) | ORAL | Status: DC | PRN

## 2014-04-09 NOTE — Patient Instructions (Signed)
Oxaliplatin Injection What is this medicine? OXALIPLATIN (ox AL i PLA tin) is a chemotherapy drug. It targets fast dividing cells, like cancer cells, and causes these cells to die. This medicine is used to treat cancers of the colon and rectum, and many other cancers. This medicine may be used for other purposes; ask your health care provider or pharmacist if you have questions. COMMON BRAND NAME(S): Eloxatin What should I tell my health care provider before I take this medicine? They need to know if you have any of these conditions: -kidney disease -an unusual or allergic reaction to oxaliplatin, other chemotherapy, other medicines, foods, dyes, or preservatives -pregnant or trying to get pregnant -breast-feeding How should I use this medicine? This drug is given as an infusion into a vein. It is administered in a hospital or clinic by a specially trained health care professional. Talk to your pediatrician regarding the use of this medicine in children. Special care may be needed. Overdosage: If you think you have taken too much of this medicine contact a poison control center or emergency room at once. NOTE: This medicine is only for you. Do not share this medicine with others. What if I miss a dose? It is important not to miss a dose. Call your doctor or health care professional if you are unable to keep an appointment. What may interact with this medicine? -medicines to increase blood counts like filgrastim, pegfilgrastim, sargramostim -probenecid -some antibiotics like amikacin, gentamicin, neomycin, polymyxin B, streptomycin, tobramycin -zalcitabine Talk to your doctor or health care professional before taking any of these medicines: -acetaminophen -aspirin -ibuprofen -ketoprofen -naproxen This list may not describe all possible interactions. Give your health care provider a list of all the medicines, herbs, non-prescription drugs, or dietary supplements you use. Also tell them if  you smoke, drink alcohol, or use illegal drugs. Some items may interact with your medicine. What should I watch for while using this medicine? Your condition will be monitored carefully while you are receiving this medicine. You will need important blood work done while you are taking this medicine. This medicine can make you more sensitive to cold. Do not drink cold drinks or use ice. Cover exposed skin before coming in contact with cold temperatures or cold objects. When out in cold weather wear warm clothing and cover your mouth and nose to warm the air that goes into your lungs. Tell your doctor if you get sensitive to the cold. This drug may make you feel generally unwell. This is not uncommon, as chemotherapy can affect healthy cells as well as cancer cells. Report any side effects. Continue your course of treatment even though you feel ill unless your doctor tells you to stop. In some cases, you may be given additional medicines to help with side effects. Follow all directions for their use. Call your doctor or health care professional for advice if you get a fever, chills or sore throat, or other symptoms of a cold or flu. Do not treat yourself. This drug decreases your body's ability to fight infections. Try to avoid being around people who are sick. This medicine may increase your risk to bruise or bleed. Call your doctor or health care professional if you notice any unusual bleeding. Be careful brushing and flossing your teeth or using a toothpick because you may get an infection or bleed more easily. If you have any dental work done, tell your dentist you are receiving this medicine. Avoid taking products that contain aspirin, acetaminophen, ibuprofen, naproxen,   or ketoprofen unless instructed by your doctor. These medicines may hide a fever. Do not become pregnant while taking this medicine. Women should inform their doctor if they wish to become pregnant or think they might be pregnant. There  is a potential for serious side effects to an unborn child. Talk to your health care professional or pharmacist for more information. Do not breast-feed an infant while taking this medicine. Call your doctor or health care professional if you get diarrhea. Do not treat yourself. What side effects may I notice from receiving this medicine? Side effects that you should report to your doctor or health care professional as soon as possible: -allergic reactions like skin rash, itching or hives, swelling of the face, lips, or tongue -low blood counts - This drug may decrease the number of white blood cells, red blood cells and platelets. You may be at increased risk for infections and bleeding. -signs of infection - fever or chills, cough, sore throat, pain or difficulty passing urine -signs of decreased platelets or bleeding - bruising, pinpoint red spots on the skin, black, tarry stools, nosebleeds -signs of decreased red blood cells - unusually weak or tired, fainting spells, lightheadedness -breathing problems -chest pain, pressure -cough -diarrhea -jaw tightness -mouth sores -nausea and vomiting -pain, swelling, redness or irritation at the injection site -pain, tingling, numbness in the hands or feet -problems with balance, talking, walking -redness, blistering, peeling or loosening of the skin, including inside the mouth -trouble passing urine or change in the amount of urine Side effects that usually do not require medical attention (report to your doctor or health care professional if they continue or are bothersome): -changes in vision -constipation -hair loss -loss of appetite -metallic taste in the mouth or changes in taste -stomach pain This list may not describe all possible side effects. Call your doctor for medical advice about side effects. You may report side effects to FDA at 1-800-FDA-1088. Where should I keep my medicine? This drug is given in a hospital or clinic and  will not be stored at home. NOTE: This sheet is a summary. It may not cover all possible information. If you have questions about this medicine, talk to your doctor, pharmacist, or health care provider.  2015, [employer]. (2008-03-20 17:22:47)  

## 2014-04-30 ENCOUNTER — Ambulatory Visit (HOSPITAL_BASED_OUTPATIENT_CLINIC_OR_DEPARTMENT_OTHER): Payer: PRIVATE HEALTH INSURANCE | Admitting: Family

## 2014-04-30 ENCOUNTER — Other Ambulatory Visit (HOSPITAL_BASED_OUTPATIENT_CLINIC_OR_DEPARTMENT_OTHER): Payer: PRIVATE HEALTH INSURANCE | Admitting: Lab

## 2014-04-30 ENCOUNTER — Encounter: Payer: Self-pay | Admitting: Family

## 2014-04-30 ENCOUNTER — Ambulatory Visit (HOSPITAL_BASED_OUTPATIENT_CLINIC_OR_DEPARTMENT_OTHER): Payer: PRIVATE HEALTH INSURANCE

## 2014-04-30 VITALS — BP 132/89 | HR 67 | Temp 98.0°F | Resp 18 | Ht 67.0 in | Wt 129.0 lb

## 2014-04-30 DIAGNOSIS — Z5111 Encounter for antineoplastic chemotherapy: Secondary | ICD-10-CM

## 2014-04-30 DIAGNOSIS — C179 Malignant neoplasm of small intestine, unspecified: Secondary | ICD-10-CM

## 2014-04-30 DIAGNOSIS — C260 Malignant neoplasm of intestinal tract, part unspecified: Secondary | ICD-10-CM

## 2014-04-30 DIAGNOSIS — C178 Malignant neoplasm of overlapping sites of small intestine: Secondary | ICD-10-CM

## 2014-04-30 LAB — CMP (CANCER CENTER ONLY)
ALT(SGPT): 23 U/L (ref 10–47)
AST: 30 U/L (ref 11–38)
Albumin: 3 g/dL — ABNORMAL LOW (ref 3.3–5.5)
Alkaline Phosphatase: 116 U/L — ABNORMAL HIGH (ref 26–84)
BUN: 11 mg/dL (ref 7–22)
CALCIUM: 8 mg/dL (ref 8.0–10.3)
CHLORIDE: 107 meq/L (ref 98–108)
CO2: 24 meq/L (ref 18–33)
CREATININE: 0.6 mg/dL (ref 0.6–1.2)
Glucose, Bld: 220 mg/dL — ABNORMAL HIGH (ref 73–118)
Potassium: 3.6 mEq/L (ref 3.3–4.7)
Sodium: 139 mEq/L (ref 128–145)
Total Bilirubin: 0.9 mg/dl (ref 0.20–1.60)
Total Protein: 5.4 g/dL — ABNORMAL LOW (ref 6.4–8.1)

## 2014-04-30 LAB — CBC WITH DIFFERENTIAL (CANCER CENTER ONLY)
BASO#: 0 10*3/uL (ref 0.0–0.2)
BASO%: 0 % (ref 0.0–2.0)
EOS%: 1.8 % (ref 0.0–7.0)
Eosinophils Absolute: 0 10*3/uL (ref 0.0–0.5)
HEMATOCRIT: 31.7 % — AB (ref 38.7–49.9)
HEMOGLOBIN: 10.9 g/dL — AB (ref 13.0–17.1)
LYMPH#: 0.5 10*3/uL — AB (ref 0.9–3.3)
LYMPH%: 20.8 % (ref 14.0–48.0)
MCH: 30.5 pg (ref 28.0–33.4)
MCHC: 34.4 g/dL (ref 32.0–35.9)
MCV: 89 fL (ref 82–98)
MONO#: 0.2 10*3/uL (ref 0.1–0.9)
MONO%: 10.6 % (ref 0.0–13.0)
NEUT#: 1.5 10*3/uL (ref 1.5–6.5)
NEUT%: 66.8 % (ref 40.0–80.0)
Platelets: 72 10*3/uL — ABNORMAL LOW (ref 145–400)
RBC: 3.57 10*6/uL — ABNORMAL LOW (ref 4.20–5.70)
RDW: 13.7 % (ref 11.1–15.7)
WBC: 2.3 10*3/uL — ABNORMAL LOW (ref 4.0–10.0)

## 2014-04-30 MED ORDER — DEXAMETHASONE SODIUM PHOSPHATE 10 MG/ML IJ SOLN
INTRAMUSCULAR | Status: AC
  Filled 2014-04-30: qty 1

## 2014-04-30 MED ORDER — DEXAMETHASONE SODIUM PHOSPHATE 10 MG/ML IJ SOLN
10.0000 mg | Freq: Once | INTRAMUSCULAR | Status: AC
  Administered 2014-04-30: 10 mg via INTRAVENOUS

## 2014-04-30 MED ORDER — OXALIPLATIN CHEMO INJECTION 100 MG/20ML
102.0000 mg/m2 | Freq: Once | INTRAVENOUS | Status: AC
  Administered 2014-04-30: 170 mg via INTRAVENOUS
  Filled 2014-04-30: qty 34

## 2014-04-30 MED ORDER — ONDANSETRON 8 MG/50ML IVPB (CHCC)
8.0000 mg | Freq: Once | INTRAVENOUS | Status: AC
  Administered 2014-04-30: 8 mg via INTRAVENOUS

## 2014-04-30 MED ORDER — DEXTROSE 5 % IV SOLN
Freq: Once | INTRAVENOUS | Status: AC
  Administered 2014-04-30: 12:00:00 via INTRAVENOUS

## 2014-04-30 NOTE — Progress Notes (Signed)
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 is here today for follow-up and day 1 cycle 2 of oxaliplatin. He is feeling good. He states that he did have some pain in his left abdomen last week but that it is better. He has gone through the radiation and chemotherapy. He denies fever, chills, n/v, cough, rash, headache, dizziness, SOB, chest pain, palpitations, constipation, diarrhea, blood in urine or stool. He has had no swelling, tenderness, numbness or tingling in his extremities. He is taking some pancreatic enzyme replacement. We did go ahead and get a CT scan of his body in July. This showed no evidence of recurrent disease. He has some slight thickness in the peri-pancreatic region. This might be from radiation.    CURRENT TREATMENT: Status post 4 cycles of CAPOX.  Patient has completed concurrent radiation therapy and Xeloda  REVIEW OF SYSTEMS: All other 10 point review of systems is negative.   PAST MEDICAL HISTORY: Past Medical History  Diagnosis Date  . Chronic hepatitis B   . Impaired fasting glucose   . Hypertension   . GERD (gastroesophageal reflux disease)   . Cancer of small intestine, including duodenum 10/26/2013  . Radiation 01/31/14-03/06/14    upper abdomen/surgical bed   PAST SURGICAL HISTORY: Past Surgical History  Procedure Laterality Date  . Appendectomy  1997  . Abdominal exploration surgery  09/19/2013    done in [place]  . Partial gastrectomy  09/19/2013  . Pancreaticoduodenectomy  09/19/2013   FAMILY HISTORY Family History  Problem Relation Age of Onset  . Hypertension    . Colon cancer Sister 68  . Esophageal cancer Father 25  . Cancer Mother     [place] Cancer   GYNECOLOGIC HISTORY:  No LMP for  male patient.   SOCIAL HISTORY:  History   Social History  . Marital Status: Married    Spouse Name: [name]     Number of Children: 2  . Years of Education: 16   Occupational History  . TECHNICAL SUPPORT      [employer]    Social History Main Topics  . Smoking status: Former Smoker -- 0.25 packs/day for 25 years    Types: Cigarettes    Start date: 09/07/1986    Quit date: 09/13/2013  . Smokeless tobacco: Never Used     Comment: quit January of 2015  . Alcohol Use: No     Comment: He drinks rarely   . Drug Use: No  . Sexual Activity: Yes    Birth Control/ Protection: Surgical     Comment: Wife had a BTL   Other Topics Concern  . 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  He has smoked at least 20 years.     Alcohol Use:  Rarely    Exercise:  Limited    Hobbies: Music            ADVANCED DIRECTIVES: <no information>  HEALTH MAINTENANCE: History  Substance Use Topics  . Smoking status: Former Smoker -- 0.25 packs/day for 25 years    Types: Cigarettes    Start date: 09/07/1986    Quit date: 09/13/2013  . Smokeless tobacco: Never Used     Comment: quit January of 2015  . Alcohol Use: No     Comment: He drinks rarely    Colonoscopy: PAP: Bone density: Lipid panel:  No Known Allergies  Current Outpatient Prescriptions  Medication Sig Dispense Refill  . capecitabine (XELODA) 500 MG tablet Take 3 pills 2 times a day for 14 days.  84 tablet  3  . dexamethasone (DECADRON) 4 MG tablet Take 2 tablets (8 mg total) by mouth 2 (two) times daily with a meal. Start the day after chemotherapy for 2 days. Take with food.  30 tablet  1  . ondansetron (ZOFRAN) 8 MG  tablet Take 1 tablet (8 mg total) by mouth 2 (two) times daily. Start the day after chemo for 2 days. Then take as needed for nausea or vomiting.  30 tablet  1  . Pancrelipase, Lip-Prot-Amyl, (CREON) 36000 UNITS CPEP Take 1 capsule by mouth 3 (three) times daily with meals.  90 capsule  3  . LORazepam (ATIVAN) 0.5 MG tablet Take 1 tablet (0.5 mg total) by mouth every 6 (six) hours as needed (Nausea or vomiting).  30 tablet  0  . prochlorperazine (COMPAZINE) 10 MG tablet Take 1 tablet (10 mg total) by mouth every 6 (six) hours as needed (Nausea or vomiting).  30 tablet  1   No current facility-administered medications for this visit.   OBJECTIVE: Filed Vitals:   04/30/14 1100  BP: 132/89  Pulse: 67  Temp: 98 F (36.7 C)  Resp: 18   Body mass index is 20.2 kg/(m^2). ECOG FS:0 - Asymptomatic Ocular: Sclerae unicteric, pupils equal, round and reactive to light Ear-nose-throat: Oropharynx clear, dentition fair Lymphatic: No cervical or supraclavicular adenopathy Lungs no rales or rhonchi, good excursion bilaterally Heart regular rate and rhythm, no murmur appreciated Abd soft, nontender, positive bowel sounds MSK no focal spinal tenderness, no joint edema Neuro: non-focal, well-oriented, appropriate affect  LAB RESULTS: CMP     Component Value Date/Time   NA 139 04/30/2014 1002   NA 142 03/05/2014 1631   NA 142 02/26/2014 1555   K 3.6 04/30/2014 1002   K 4.5 03/05/2014 1631   K 4.2 02/26/2014 1555   CL 107 04/30/2014 1002   CL 105 03/05/2014 1631   CO2 24 04/30/2014 1002   CO2 27 03/05/2014 1631   CO2 25 02/26/2014 1555   GLUCOSE 220* 04/30/2014 1002   GLUCOSE 103* 03/05/2014 1631   GLUCOSE 126 02/26/2014 1555   BUN 11 04/30/2014 1002   BUN 10 03/05/2014 1631   BUN 11.9 02/26/2014 1555   CREATININE 0.6 04/30/2014 1002   CREATININE 0.84 03/05/2014 1631   CREATININE 0.7 02/26/2014 1555   CALCIUM 8.0 04/30/2014 1002   CALCIUM 8.5 03/05/2014 1631   CALCIUM 8.5 02/26/2014 1555   PROT 5.4*  04/30/2014 1002   PROT 5.7* 03/05/2014 1631   PROT 5.6* 02/26/2014 1555   ALBUMIN 3.3* 03/05/2014 1631   ALBUMIN 3.4* 02/26/2014 1555   AST 30 04/30/2014 1002   AST 24 03/05/2014 1631   AST 21 02/26/2014 1555   ALT 23 04/30/2014 1002   ALT 15 03/05/2014 1631   ALT 13 02/26/2014 1555   ALKPHOS 116* 04/30/2014 1002   ALKPHOS 114 03/05/2014 1631   ALKPHOS 114 02/26/2014 1555  BILITOT 0.90 04/30/2014 1002   BILITOT 0.6 03/05/2014 1631   BILITOT 0.91 02/26/2014 1555   GFRNONAA 78 11/30/2012 0813   GFRAA >89 11/30/2012 0813   No results found for this basename: SPEP, UPEP,  kappa and lambda light chains   Lab Results  Component Value Date   WBC 2.3* 04/30/2014   NEUTROABS 1.5 04/30/2014   HGB 10.9* 04/30/2014   HCT 31.7* 04/30/2014   MCV 89 04/30/2014   PLT 72* 04/30/2014   No results found for this basename: LABCA2   No components found with this basename: QZESP233   No results found for this basename: INR,  in the last 168 hours Urinalysis    Component Value Date/Time   BILIRUBINUR Neg 12/07/2012 0826   PROTEINUR Neg 12/07/2012 0826   UROBILINOGEN negative 12/07/2012 0826   NITRITE Neg 12/07/2012 0826   LEUKOCYTESUR Negative 12/07/2012 0826   STUDIES: None  ASSESSMENT/PLAN: Mr. Glendinning is 43 year old with locally advanced adenocarcinoma small bowel. He underwent resection. He had up front chemotherapy with Xeloda and oxaliplatin. He then had radiation and chemotherapy with low-dose Xeloda. Now he is in his final phase of therapy with full dose Xeloda and oxaliplatin.  He will take 1500 mg twice a day of Xeloda for 14 days out of 21. The oxaliplatin is once every 3 weeks. Today is day 1 cycle 2. He will have a total of 4 cycles.  He has his appointment schedule. We will see him for his next treatment in September.  He is in agreement with this and knows to call here with any questions or concerns. We can certainly see him sooner if need be.   Eliezer Bottom, NP 04/30/2014 11:39 AM

## 2014-04-30 NOTE — Patient Instructions (Signed)
Oxaliplatin Injection What is this medicine? OXALIPLATIN (ox AL i PLA tin) is a chemotherapy drug. It targets fast dividing cells, like cancer cells, and causes these cells to die. This medicine is used to treat cancers of the colon and rectum, and many other cancers. This medicine may be used for other purposes; ask your health care provider or pharmacist if you have questions. COMMON BRAND NAME(S): Eloxatin What should I tell my health care provider before I take this medicine? They need to know if you have any of these conditions: -kidney disease -an unusual or allergic reaction to oxaliplatin, other chemotherapy, other medicines, foods, dyes, or preservatives -pregnant or trying to get pregnant -breast-feeding How should I use this medicine? This drug is given as an infusion into a vein. It is administered in a hospital or clinic by a specially trained health care professional. Talk to your pediatrician regarding the use of this medicine in children. Special care may be needed. Overdosage: If you think you have taken too much of this medicine contact a poison control center or emergency room at once. NOTE: This medicine is only for you. Do not share this medicine with others. What if I miss a dose? It is important not to miss a dose. Call your doctor or health care professional if you are unable to keep an appointment. What may interact with this medicine? -medicines to increase blood counts like filgrastim, pegfilgrastim, sargramostim -probenecid -some antibiotics like amikacin, gentamicin, neomycin, polymyxin B, streptomycin, tobramycin -zalcitabine Talk to your doctor or health care professional before taking any of these medicines: -acetaminophen -aspirin -ibuprofen -ketoprofen -naproxen This list may not describe all possible interactions. Give your health care provider a list of all the medicines, herbs, non-prescription drugs, or dietary supplements you use. Also tell them if  you smoke, drink alcohol, or use illegal drugs. Some items may interact with your medicine. What should I watch for while using this medicine? Your condition will be monitored carefully while you are receiving this medicine. You will need important blood work done while you are taking this medicine. This medicine can make you more sensitive to cold. Do not drink cold drinks or use ice. Cover exposed skin before coming in contact with cold temperatures or cold objects. When out in cold weather wear warm clothing and cover your mouth and nose to warm the air that goes into your lungs. Tell your doctor if you get sensitive to the cold. This drug may make you feel generally unwell. This is not uncommon, as chemotherapy can affect healthy cells as well as cancer cells. Report any side effects. Continue your course of treatment even though you feel ill unless your doctor tells you to stop. In some cases, you may be given additional medicines to help with side effects. Follow all directions for their use. Call your doctor or health care professional for advice if you get a fever, chills or sore throat, or other symptoms of a cold or flu. Do not treat yourself. This drug decreases your body's ability to fight infections. Try to avoid being around people who are sick. This medicine may increase your risk to bruise or bleed. Call your doctor or health care professional if you notice any unusual bleeding. Be careful brushing and flossing your teeth or using a toothpick because you may get an infection or bleed more easily. If you have any dental work done, tell your dentist you are receiving this medicine. Avoid taking products that contain aspirin, acetaminophen, ibuprofen, naproxen,   or ketoprofen unless instructed by your doctor. These medicines may hide a fever. Do not become pregnant while taking this medicine. Women should inform their doctor if they wish to become pregnant or think they might be pregnant. There  is a potential for serious side effects to an unborn child. Talk to your health care professional or pharmacist for more information. Do not breast-feed an infant while taking this medicine. Call your doctor or health care professional if you get diarrhea. Do not treat yourself. What side effects may I notice from receiving this medicine? Side effects that you should report to your doctor or health care professional as soon as possible: -allergic reactions like skin rash, itching or hives, swelling of the face, lips, or tongue -low blood counts - This drug may decrease the number of white blood cells, red blood cells and platelets. You may be at increased risk for infections and bleeding. -signs of infection - fever or chills, cough, sore throat, pain or difficulty passing urine -signs of decreased platelets or bleeding - bruising, pinpoint red spots on the skin, black, tarry stools, nosebleeds -signs of decreased red blood cells - unusually weak or tired, fainting spells, lightheadedness -breathing problems -chest pain, pressure -cough -diarrhea -jaw tightness -mouth sores -nausea and vomiting -pain, swelling, redness or irritation at the injection site -pain, tingling, numbness in the hands or feet -problems with balance, talking, walking -redness, blistering, peeling or loosening of the skin, including inside the mouth -trouble passing urine or change in the amount of urine Side effects that usually do not require medical attention (report to your doctor or health care professional if they continue or are bothersome): -changes in vision -constipation -hair loss -loss of appetite -metallic taste in the mouth or changes in taste -stomach pain This list may not describe all possible side effects. Call your doctor for medical advice about side effects. You may report side effects to FDA at 1-800-FDA-1088. Where should I keep my medicine? This drug is given in a hospital or clinic and  will not be stored at home. NOTE: This sheet is a summary. It may not cover all possible information. If you have questions about this medicine, talk to your doctor, pharmacist, or health care provider.  2015, [employer]. (2008-03-20 17:22:47)  

## 2014-05-21 ENCOUNTER — Other Ambulatory Visit: Payer: PRIVATE HEALTH INSURANCE | Admitting: Lab

## 2014-05-21 ENCOUNTER — Ambulatory Visit: Payer: PRIVATE HEALTH INSURANCE | Admitting: Family

## 2014-05-21 ENCOUNTER — Ambulatory Visit: Payer: PRIVATE HEALTH INSURANCE

## 2014-05-24 ENCOUNTER — Ambulatory Visit (HOSPITAL_BASED_OUTPATIENT_CLINIC_OR_DEPARTMENT_OTHER): Payer: PRIVATE HEALTH INSURANCE

## 2014-05-24 ENCOUNTER — Encounter: Payer: Self-pay | Admitting: Hematology & Oncology

## 2014-05-24 ENCOUNTER — Other Ambulatory Visit (HOSPITAL_BASED_OUTPATIENT_CLINIC_OR_DEPARTMENT_OTHER): Payer: PRIVATE HEALTH INSURANCE | Admitting: Lab

## 2014-05-24 ENCOUNTER — Ambulatory Visit (HOSPITAL_BASED_OUTPATIENT_CLINIC_OR_DEPARTMENT_OTHER): Payer: PRIVATE HEALTH INSURANCE | Admitting: Hematology & Oncology

## 2014-05-24 VITALS — BP 128/89 | HR 67 | Temp 97.9°F | Resp 18 | Ht 67.0 in | Wt 129.0 lb

## 2014-05-24 DIAGNOSIS — C178 Malignant neoplasm of overlapping sites of small intestine: Secondary | ICD-10-CM

## 2014-05-24 DIAGNOSIS — Z5111 Encounter for antineoplastic chemotherapy: Secondary | ICD-10-CM

## 2014-05-24 DIAGNOSIS — C179 Malignant neoplasm of small intestine, unspecified: Secondary | ICD-10-CM

## 2014-05-24 DIAGNOSIS — Z23 Encounter for immunization: Secondary | ICD-10-CM

## 2014-05-24 LAB — CBC WITH DIFFERENTIAL (CANCER CENTER ONLY)
BASO#: 0 10*3/uL (ref 0.0–0.2)
BASO%: 0.5 % (ref 0.0–2.0)
EOS%: 3.7 % (ref 0.0–7.0)
Eosinophils Absolute: 0.1 10*3/uL (ref 0.0–0.5)
HCT: 34.5 % — ABNORMAL LOW (ref 38.7–49.9)
HGB: 11.6 g/dL — ABNORMAL LOW (ref 13.0–17.1)
LYMPH#: 0.5 10*3/uL — AB (ref 0.9–3.3)
LYMPH%: 25.5 % (ref 14.0–48.0)
MCH: 30.4 pg (ref 28.0–33.4)
MCHC: 33.6 g/dL (ref 32.0–35.9)
MCV: 90 fL (ref 82–98)
MONO#: 0.3 10*3/uL (ref 0.1–0.9)
MONO%: 15.4 % — ABNORMAL HIGH (ref 0.0–13.0)
NEUT#: 1 10*3/uL — ABNORMAL LOW (ref 1.5–6.5)
NEUT%: 54.9 % (ref 40.0–80.0)
PLATELETS: 75 10*3/uL — AB (ref 145–400)
RBC: 3.82 10*6/uL — ABNORMAL LOW (ref 4.20–5.70)
RDW: 17.1 % — ABNORMAL HIGH (ref 11.1–15.7)
WBC: 1.9 10*3/uL — AB (ref 4.0–10.0)

## 2014-05-24 LAB — CMP (CANCER CENTER ONLY)
ALT(SGPT): 26 U/L (ref 10–47)
AST: 37 U/L (ref 11–38)
Albumin: 3.1 g/dL — ABNORMAL LOW (ref 3.3–5.5)
Alkaline Phosphatase: 110 U/L — ABNORMAL HIGH (ref 26–84)
BILIRUBIN TOTAL: 1.2 mg/dL (ref 0.20–1.60)
BUN, Bld: 7 mg/dL (ref 7–22)
CHLORIDE: 104 meq/L (ref 98–108)
CO2: 25 meq/L (ref 18–33)
CREATININE: 0.7 mg/dL (ref 0.6–1.2)
Calcium: 8.6 mg/dL (ref 8.0–10.3)
Glucose, Bld: 161 mg/dL — ABNORMAL HIGH (ref 73–118)
Potassium: 3.7 mEq/L (ref 3.3–4.7)
SODIUM: 139 meq/L (ref 128–145)
Total Protein: 5.5 g/dL — ABNORMAL LOW (ref 6.4–8.1)

## 2014-05-24 MED ORDER — DEXAMETHASONE SODIUM PHOSPHATE 20 MG/5ML IJ SOLN
INTRAMUSCULAR | Status: AC
  Filled 2014-05-24: qty 5

## 2014-05-24 MED ORDER — ONDANSETRON 8 MG/50ML IVPB (CHCC)
8.0000 mg | Freq: Once | INTRAVENOUS | Status: AC
  Administered 2014-05-24: 8 mg via INTRAVENOUS

## 2014-05-24 MED ORDER — OXALIPLATIN CHEMO INJECTION 100 MG/20ML
102.0000 mg/m2 | Freq: Once | INTRAVENOUS | Status: AC
Start: 2014-05-24 — End: 2014-05-24
  Administered 2014-05-24: 170 mg via INTRAVENOUS
  Filled 2014-05-24: qty 34

## 2014-05-24 MED ORDER — DEXAMETHASONE SODIUM PHOSPHATE 10 MG/ML IJ SOLN
10.0000 mg | Freq: Once | INTRAMUSCULAR | Status: AC
  Administered 2014-05-24: 10 mg via INTRAVENOUS

## 2014-05-24 MED ORDER — PANTOPRAZOLE SODIUM 40 MG PO TBEC: 40.0000 mg | DELAYED_RELEASE_TABLET | Freq: Two times a day (BID) | ORAL | Status: DC

## 2014-05-24 MED ORDER — PANCRELIPASE (LIP-PROT-AMYL) 36000-114000 UNITS PO CPEP: 36000.0000 [IU] | ORAL_CAPSULE | Freq: Three times a day (TID) | ORAL | Status: DC

## 2014-05-24 MED ORDER — INFLUENZA VAC SPLIT QUAD 0.5 ML IM SUSY
0.5000 mL | PREFILLED_SYRINGE | Freq: Once | INTRAMUSCULAR | Status: AC
  Administered 2014-05-24: 0.5 mL via INTRAMUSCULAR
  Filled 2014-05-24: qty 0.5

## 2014-05-24 MED ORDER — DEXTROSE 5 % IV SOLN
Freq: Once | INTRAVENOUS | Status: AC
  Administered 2014-05-24: 11:00:00 via INTRAVENOUS

## 2014-05-24 NOTE — Progress Notes (Signed)
MD aware PLT 75,000 and ANC 1.0. MD okay to proceed with chemo.

## 2014-05-24 NOTE — Patient Instructions (Signed)
[facility] Discharge Instructions for Patients Receiving Chemotherapy  Today you received chemotherapy agents:  Oxaliplatin  To help prevent nausea and vomiting after your treatment, we encourage you to take your nausea medication as prescribed. If you develop nausea and vomiting that is not controlled by your nausea medication, call the clinic. If it is after clinic hours your family physician or the after hours number for the clinic or go to the Emergency Department.   BELOW ARE SYMPTOMS THAT SHOULD BE REPORTED IMMEDIATELY:  *FEVER GREATER THAN 100.5 F  *CHILLS WITH OR WITHOUT FEVER  NAUSEA AND VOMITING THAT IS NOT CONTROLLED WITH YOUR NAUSEA MEDICATION  *UNUSUAL SHORTNESS OF BREATH  *UNUSUAL BRUISING OR BLEEDING  TENDERNESS IN MOUTH AND THROAT WITH OR WITHOUT PRESENCE OF ULCERS  *URINARY PROBLEMS  *BOWEL PROBLEMS  UNUSUAL RASH Items with * indicate a potential emergency and should be followed up as soon as possible.   Please let the nurse know about any problems that you may have experienced. Feel free to call the clinic you have any questions or concerns. The clinic phone number is [phone].   I have been informed and understand all the instructions given to me. I know to contact the clinic, my physician, or go to the Emergency Department if any problems should occur. I do not have any questions at this time, but understand that I may call the clinic during office hours   should I have any questions or need assistance in obtaining follow up care.    __________________________________________  _____________  __________ Signature of Patient or Authorized Representative            Date                   Time    __________________________________________ Nurse's Signature   Oxaliplatin Injection What is this medicine? OXALIPLATIN (ox AL i PLA tin) is a chemotherapy drug. It targets fast dividing cells, like cancer cells, and causes these cells to  die. This medicine is used to treat cancers of the colon and rectum, and many other cancers. This medicine may be used for other purposes; ask your health care provider or pharmacist if you have questions. COMMON BRAND NAME(S): Eloxatin What should I tell my health care provider before I take this medicine? They need to know if you have any of these conditions: -kidney disease -an unusual or allergic reaction to oxaliplatin, other chemotherapy, other medicines, foods, dyes, or preservatives -pregnant or trying to get pregnant -breast-feeding How should I use this medicine? This drug is given as an infusion into a vein. It is administered in a hospital or clinic by a specially trained health care professional. Talk to your pediatrician regarding the use of this medicine in children. Special care may be needed. Overdosage: If you think you have taken too much of this medicine contact a poison control center or emergency room at once. NOTE: This medicine is only for you. Do not share this medicine with others. What if I miss a dose? It is important not to miss a dose. Call your doctor or health care professional if you are unable to keep an appointment. What may interact with this medicine? -medicines to increase blood counts like filgrastim, pegfilgrastim, sargramostim -probenecid -some antibiotics like amikacin, gentamicin, neomycin, polymyxin B, streptomycin, tobramycin -zalcitabine Talk to your doctor or health care professional before taking any of these medicines: -acetaminophen -aspirin -ibuprofen -ketoprofen -naproxen This list may not describe all possible interactions. Give  your health care provider a list of all the medicines, herbs, non-prescription drugs, or dietary supplements you use. Also tell them if you smoke, drink alcohol, or use illegal drugs. Some items may interact with your medicine. What should I watch for while using this medicine? Your condition will be monitored  carefully while you are receiving this medicine. You will need important blood work done while you are taking this medicine. This medicine can make you more sensitive to cold. Do not drink cold drinks or use ice. Cover exposed skin before coming in contact with cold temperatures or cold objects. When out in cold weather wear warm clothing and cover your mouth and nose to warm the air that goes into your lungs. Tell your doctor if you get sensitive to the cold. This drug may make you feel generally unwell. This is not uncommon, as chemotherapy can affect healthy cells as well as cancer cells. Report any side effects. Continue your course of treatment even though you feel ill unless your doctor tells you to stop. In some cases, you may be given additional medicines to help with side effects. Follow all directions for their use. Call your doctor or health care professional for advice if you get a fever, chills or sore throat, or other symptoms of a cold or flu. Do not treat yourself. This drug decreases your body's ability to fight infections. Try to avoid being around people who are sick. This medicine may increase your risk to bruise or bleed. Call your doctor or health care professional if you notice any unusual bleeding. Be careful brushing and flossing your teeth or using a toothpick because you may get an infection or bleed more easily. If you have any dental work done, tell your dentist you are receiving this medicine. Avoid taking products that contain aspirin, acetaminophen, ibuprofen, naproxen, or ketoprofen unless instructed by your doctor. These medicines may hide a fever. Do not become pregnant while taking this medicine. Women should inform their doctor if they wish to become pregnant or think they might be pregnant. There is a potential for serious side effects to an unborn child. Talk to your health care professional or pharmacist for more information. Do not breast-feed an infant while taking  this medicine. Call your doctor or health care professional if you get diarrhea. Do not treat yourself. What side effects may I notice from receiving this medicine? Side effects that you should report to your doctor or health care professional as soon as possible: -allergic reactions like skin rash, itching or hives, swelling of the face, lips, or tongue -low blood counts - This drug may decrease the number of white blood cells, red blood cells and platelets. You may be at increased risk for infections and bleeding. -signs of infection - fever or chills, cough, sore throat, pain or difficulty passing urine -signs of decreased platelets or bleeding - bruising, pinpoint red spots on the skin, black, tarry stools, nosebleeds -signs of decreased red blood cells - unusually weak or tired, fainting spells, lightheadedness -breathing problems -chest pain, pressure -cough -diarrhea -jaw tightness -mouth sores -nausea and vomiting -pain, swelling, redness or irritation at the injection site -pain, tingling, numbness in the hands or feet -problems with balance, talking, walking -redness, blistering, peeling or loosening of the skin, including inside the mouth -trouble passing urine or change in the amount of urine Side effects that usually do not require medical attention (report to your doctor or health care professional if they continue or are bothersome): -  changes in vision -constipation -hair loss -loss of appetite -metallic taste in the mouth or changes in taste -stomach pain This list may not describe all possible side effects. Call your doctor for medical advice about side effects. You may report side effects to FDA at 1-800-FDA-1088. Where should I keep my medicine? This drug is given in a hospital or clinic and will not be stored at home. NOTE: This sheet is a summary. It may not cover all possible information. If you have questions about this medicine, talk to your doctor, pharmacist,  or health care provider.  2015, Elsevier/Gold Standard. (2008-03-20 17:22:47)

## 2014-05-24 NOTE — Progress Notes (Signed)
Hematology and Oncology Follow Up Visit  Keith [name] [id number] 04-04-1971 43 y.o. 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 is back for followup. He is looking great. [name] not lost any weight. We will refill his Creon.  He is complaining of some abdominal pain. I. this probably is a combination of the Xeloda and Decadron. I will go and give him some Protonix. I think this would help.  [name] eating well. [name] not having vomiting. There may be a little bit of nausea. There is had no headache. [name] had no bleeding. [name] had no diarrhea. Presently no leg swelling. He said no rashes.  Overall, his performance status is ECOG 1  Medications: Current outpatient prescriptions:capecitabine (XELODA) 500 MG tablet, Take 3 pills 2 times a day for 14 days., Disp: 84 tablet, Rfl: 3;  dexamethasone (DECADRON) 4 MG tablet, Take 2 tablets (8 mg total) by mouth 2 (two) times daily with a meal. Start the day after chemotherapy for 2 days. Take with food., Disp: 30 tablet, Rfl: 1 lipase/protease/amylase (CREON) 36000 UNITS CPEP capsule, Take 1 capsule (36,000 Units total) by mouth 3 (three) times daily with meals., Disp: 90 capsule, Rfl: 3;  LORazepam (ATIVAN) 0.5 MG tablet, Take 1 tablet (0.5 mg total) by mouth every 6 (six) hours as needed (Nausea or vomiting)., Disp: 30 tablet, Rfl: 0 ondansetron (ZOFRAN) 8 MG tablet, Take 1 tablet (8 mg total) by mouth 2 (two) times daily. Start the day after chemo for 2 days. Then take as needed for nausea or vomiting., Disp: 30 tablet, Rfl: 1;  prochlorperazine (COMPAZINE) 10 MG tablet, Take 1 tablet (10 mg total) by mouth every 6 (six) hours as needed (Nausea or vomiting)., Disp: 30 tablet, Rfl: 1 pantoprazole (PROTONIX) 40 MG tablet, Take 1 tablet (40 mg total) by mouth 2 (two) times daily., Disp: 60 tablet, Rfl: 4 No current  facility-administered medications for this visit. Facility-Administered Medications Ordered in Other Visits: dexamethasone (DECADRON) injection 10 mg, 10 mg, Intravenous, Once, Volanda Napoleon, MD;  dextrose 5 % solution, , Intravenous, Once, Volanda Napoleon, MD;  ondansetron (ZOFRAN) IVPB 8 mg, 8 mg, Intravenous, Once, Volanda Napoleon, MD;  oxaliplatin (ELOXATIN) 170 mg in dextrose 5 % 500 mL chemo infusion, 102 mg/m2 (Treatment Plan Actual), Intravenous, Once, Volanda Napoleon, MD  Allergies: No Known Allergies  Past Medical History, Surgical history, Social history, and Family History were reviewed and updated.  Review of Systems: As above  Physical Exam:  height is 5\' 7"  (1.702 m) and weight is 129 lb (58.514 kg). His oral temperature is 97.9 F (36.6 C). His blood pressure is 128/89 and his pulse is 67. His respiration is 18.   Lungs are clear. Head exam shows no scleral icterus. There is no oral lesion. Oral mucosa is moist. There is no adenopathy in the neck. Cardiac exam regular in with him with no murmurs rubs or bruits. Abdomen is soft. Well-healed upper-outer a scar. He has no fluid wave. There is no guarding or rebound tenderness. Vibrissa slight left upper quadrant tenderness to palpation. There is no palpable liver or spleen tip. Back exam no tenderness of the spine, ribs or hips. Extremities shows no clubbing, cyanosis or edema. Skin exam no rashes no ecchymosis. Neurological exam is nonfocal.  Lab Results  Component Value Date  WBC 1.9* 05/24/2014   HGB 11.6* 05/24/2014   HCT 34.5* 05/24/2014   MCV 90 05/24/2014   PLT 75* 05/24/2014     Chemistry      Component Value Date/Time   NA 139 05/24/2014 0931   NA 142 03/05/2014 1631   NA 142 02/26/2014 1555   K 3.7 05/24/2014 0931   K 4.5 03/05/2014 1631   K 4.2 02/26/2014 1555   CL 104 05/24/2014 0931   CL 105 03/05/2014 1631   CO2 25 05/24/2014 0931   CO2 27 03/05/2014 1631   CO2 25 02/26/2014 1555   BUN 7 05/24/2014 0931   BUN 10  03/05/2014 1631   BUN 11.9 02/26/2014 1555   CREATININE 0.7 05/24/2014 0931   CREATININE 0.84 03/05/2014 1631   CREATININE 0.7 02/26/2014 1555      Component Value Date/Time   CALCIUM 8.6 05/24/2014 0931   CALCIUM 8.5 03/05/2014 1631   CALCIUM 8.5 02/26/2014 1555   ALKPHOS 110* 05/24/2014 0931   ALKPHOS 114 03/05/2014 1631   ALKPHOS 114 02/26/2014 1555   AST 37 05/24/2014 0931   AST 24 03/05/2014 1631   AST 21 02/26/2014 1555   ALT 26 05/24/2014 0931   ALT 15 03/05/2014 1631   ALT 13 02/26/2014 1555   BILITOT 1.20 05/24/2014 0931   BILITOT 0.6 03/05/2014 1631   BILITOT 0.91 02/26/2014 1555         Impression and Plan: Keith [name] is 43 year old gentleman. He has a locally advanced small bowel cancer. He underwent resection. She has 9 positive lymph nodes. As such, he is at risk for recurrence.  We will go ahead with his first cycle of treatment. At this, he'll have his fourth.  At his fourth cycle, we will get him set up with scans and follow along.  Volanda Napoleon, MD 9/17/201510:48 AM

## 2014-06-06 ENCOUNTER — Encounter: Payer: Self-pay | Admitting: Nurse Practitioner

## 2014-06-06 NOTE — Progress Notes (Signed)
Office notes and treatment summary faxed to [facility] as requested [phone] F [phone] P). Confirmation received.

## 2014-06-11 ENCOUNTER — Ambulatory Visit (HOSPITAL_BASED_OUTPATIENT_CLINIC_OR_DEPARTMENT_OTHER): Payer: Commercial Managed Care - PPO

## 2014-06-11 ENCOUNTER — Other Ambulatory Visit (HOSPITAL_BASED_OUTPATIENT_CLINIC_OR_DEPARTMENT_OTHER): Payer: Commercial Managed Care - PPO | Admitting: Lab

## 2014-06-11 ENCOUNTER — Ambulatory Visit (HOSPITAL_BASED_OUTPATIENT_CLINIC_OR_DEPARTMENT_OTHER): Payer: Commercial Managed Care - PPO | Admitting: Hematology & Oncology

## 2014-06-11 VITALS — BP 148/91 | HR 87 | Temp 97.7°F | Resp 16 | Wt 132.0 lb

## 2014-06-11 DIAGNOSIS — C779 Secondary and unspecified malignant neoplasm of lymph node, unspecified: Secondary | ICD-10-CM

## 2014-06-11 DIAGNOSIS — C178 Malignant neoplasm of overlapping sites of small intestine: Secondary | ICD-10-CM

## 2014-06-11 DIAGNOSIS — Z5111 Encounter for antineoplastic chemotherapy: Secondary | ICD-10-CM

## 2014-06-11 DIAGNOSIS — C179 Malignant neoplasm of small intestine, unspecified: Secondary | ICD-10-CM

## 2014-06-11 DIAGNOSIS — D2372 Other benign neoplasm of skin of left lower limb, including hip: Secondary | ICD-10-CM

## 2014-06-11 LAB — CBC WITH DIFFERENTIAL (CANCER CENTER ONLY)
BASO#: 0 10*3/uL (ref 0.0–0.2)
BASO%: 0.4 % (ref 0.0–2.0)
EOS%: 1.8 % (ref 0.0–7.0)
Eosinophils Absolute: 0 10*3/uL (ref 0.0–0.5)
HEMATOCRIT: 32 % — AB (ref 38.7–49.9)
HGB: 11.2 g/dL — ABNORMAL LOW (ref 13.0–17.1)
LYMPH#: 0.5 10*3/uL — ABNORMAL LOW (ref 0.9–3.3)
LYMPH%: 21.3 % (ref 14.0–48.0)
MCH: 31.1 pg (ref 28.0–33.4)
MCHC: 35 g/dL (ref 32.0–35.9)
MCV: 89 fL (ref 82–98)
MONO#: 0.3 10*3/uL (ref 0.1–0.9)
MONO%: 15.1 % — ABNORMAL HIGH (ref 0.0–13.0)
NEUT#: 1.4 10*3/uL — ABNORMAL LOW (ref 1.5–6.5)
NEUT%: 61.4 % (ref 40.0–80.0)
PLATELETS: 76 10*3/uL — AB (ref 145–400)
RBC: 3.6 10*6/uL — ABNORMAL LOW (ref 4.20–5.70)
RDW: 18.7 % — ABNORMAL HIGH (ref 11.1–15.7)
WBC: 2.3 10*3/uL — ABNORMAL LOW (ref 4.0–10.0)

## 2014-06-11 LAB — CMP (CANCER CENTER ONLY)
ALK PHOS: 111 U/L — AB (ref 26–84)
ALT: 28 U/L (ref 10–47)
AST: 33 U/L (ref 11–38)
Albumin: 3.1 g/dL — ABNORMAL LOW (ref 3.3–5.5)
BUN: 8 mg/dL (ref 7–22)
CO2: 26 mEq/L (ref 18–33)
CREATININE: 0.8 mg/dL (ref 0.6–1.2)
Calcium: 8.6 mg/dL (ref 8.0–10.3)
Chloride: 107 mEq/L (ref 98–108)
Glucose, Bld: 140 mg/dL — ABNORMAL HIGH (ref 73–118)
Potassium: 3.6 mEq/L (ref 3.3–4.7)
Sodium: 145 mEq/L (ref 128–145)
Total Bilirubin: 1.3 mg/dl (ref 0.20–1.60)
Total Protein: 5.4 g/dL — ABNORMAL LOW (ref 6.4–8.1)

## 2014-06-11 LAB — CEA: CEA: 3.6 ng/mL (ref 0.0–5.0)

## 2014-06-11 MED ORDER — OXALIPLATIN CHEMO INJECTION 100 MG/20ML
102.0000 mg/m2 | Freq: Once | INTRAVENOUS | Status: AC
  Administered 2014-06-11: 170 mg via INTRAVENOUS
  Filled 2014-06-11: qty 34

## 2014-06-11 MED ORDER — DEXTROSE 5 % IV SOLN
Freq: Once | INTRAVENOUS | Status: AC
  Administered 2014-06-11: 11:00:00 via INTRAVENOUS

## 2014-06-11 MED ORDER — DEXAMETHASONE SODIUM PHOSPHATE 10 MG/ML IJ SOLN
10.0000 mg | Freq: Once | INTRAMUSCULAR | Status: AC
  Administered 2014-06-11: 10 mg via INTRAVENOUS

## 2014-06-11 MED ORDER — DEXAMETHASONE SODIUM PHOSPHATE 10 MG/ML IJ SOLN
INTRAMUSCULAR | Status: AC
  Filled 2014-06-11: qty 1

## 2014-06-11 MED ORDER — ONDANSETRON 8 MG/50ML IVPB (CHCC)
8.0000 mg | Freq: Once | INTRAVENOUS | Status: AC
  Administered 2014-06-11: 8 mg via INTRAVENOUS

## 2014-06-11 NOTE — Progress Notes (Signed)
MD aware that [place] is 1.4 and PLT count is 75. MD verbalizes that pt is okay to receive chemotherapy today.

## 2014-06-11 NOTE — Progress Notes (Signed)
Hematology and Oncology Follow Up Visit  Ashante Snelling [id number] 12-Mar-1971 43 y.o. 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  Dorian is back for followup. He is looking great. [name] not lost any weight. He is doing well on the Creon.  He is not complaining of any abdominal pain. He had this before and I think that this probably was a combination of the Xeloda and Decadron. I I gave him  some Protonix. I think this has helped him .  [name] eating well. [name] not having vomiting. There may be a little bit of nausea. There is had no headache. [name] had no bleeding. [name] had no diarrhea. Presently no leg swelling. He said no rashes.  He has a little bit of tingling in the fingers and toes.  Overall, his performance status is ECOG 1  Medications: Current outpatient prescriptions:capecitabine (XELODA) 500 MG tablet, Take 3 pills 2 times a day for 14 days., Disp: 84 tablet, Rfl: 3;  dexamethasone (DECADRON) 4 MG tablet, Take 2 tablets (8 mg total) by mouth 2 (two) times daily with a meal. Start the day after chemotherapy for 2 days. Take with food., Disp: 30 tablet, Rfl: 1 lipase/protease/amylase (CREON) 36000 UNITS CPEP capsule, Take 1 capsule (36,000 Units total) by mouth 3 (three) times daily with meals., Disp: 90 capsule, Rfl: 3;  LORazepam (ATIVAN) 0.5 MG tablet, Take 1 tablet (0.5 mg total) by mouth every 6 (six) hours as needed (Nausea or vomiting)., Disp: 30 tablet, Rfl: 0 ondansetron (ZOFRAN) 8 MG tablet, Take 1 tablet (8 mg total) by mouth 2 (two) times daily. Start the day after chemo for 2 days. Then take as needed for nausea or vomiting., Disp: 30 tablet, Rfl: 1;  pantoprazole (PROTONIX) 40 MG tablet, Take 1 tablet (40 mg total) by mouth 2 (two) times daily., Disp: 60 tablet, Rfl: 4 prochlorperazine (COMPAZINE) 10 MG tablet, Take 1 tablet (10 mg total) by  mouth every 6 (six) hours as needed (Nausea or vomiting)., Disp: 30 tablet, Rfl: 1  Allergies: No Known Allergies  Past Medical History, Surgical history, Social history, and Family History were reviewed and updated.  Review of Systems: As above  Physical Exam:  weight is 132 lb (59.875 kg). His oral temperature is 97.7 F (36.5 C). His blood pressure is 148/91 and his pulse is 87. His respiration is 16.   Lungs are clear. Head exam shows no scleral icterus. There is no oral lesion. Oral mucosa is moist. There is no adenopathy in the neck. Cardiac exam regular in with him with no murmurs rubs or bruits. Abdomen is soft. Well-healed upper-outer a scar. He has no fluid wave. There is no guarding or rebound tenderness. Vibrissa slight left upper quadrant tenderness to palpation. There is no palpable liver or spleen tip. Back exam no tenderness of the spine, ribs or hips. Extremities shows no clubbing, cyanosis or edema. Skin exam no rashes no ecchymosis. Neurological exam is nonfocal.  Lab Results  Component Value Date   WBC 2.3* 06/11/2014   HGB 11.2* 06/11/2014   HCT 32.0* 06/11/2014   MCV 89 06/11/2014   PLT 76* 06/11/2014     Chemistry      Component Value Date/Time   NA 139 05/24/2014 0931   NA 142 03/05/2014 1631   NA 142 02/26/2014 1555   K 3.7 05/24/2014  0931   K 4.5 03/05/2014 1631   K 4.2 02/26/2014 1555   CL 104 05/24/2014 0931   CL 105 03/05/2014 1631   CO2 25 05/24/2014 0931   CO2 27 03/05/2014 1631   CO2 25 02/26/2014 1555   BUN 7 05/24/2014 0931   BUN 10 03/05/2014 1631   BUN 11.9 02/26/2014 1555   CREATININE 0.7 05/24/2014 0931   CREATININE 0.84 03/05/2014 1631   CREATININE 0.7 02/26/2014 1555      Component Value Date/Time   CALCIUM 8.6 05/24/2014 0931   CALCIUM 8.5 03/05/2014 1631   CALCIUM 8.5 02/26/2014 1555   ALKPHOS 110* 05/24/2014 0931   ALKPHOS 114 03/05/2014 1631   ALKPHOS 114 02/26/2014 1555   AST 37 05/24/2014 0931   AST 24 03/05/2014 1631   AST 21 02/26/2014 1555   ALT  26 05/24/2014 0931   ALT 15 03/05/2014 1631   ALT 13 02/26/2014 1555   BILITOT 1.20 05/24/2014 0931   BILITOT 0.6 03/05/2014 1631   BILITOT 0.91 02/26/2014 1555         Impression and Plan: Mr. Golda is 43 year old gentleman. He has a locally advanced small bowel cancer. He underwent resection. He has 9 positive lymph nodes. As such, he is at risk for recurrence.  We will go ahead with his fourth and final cycle of treatment. We will then set him up with scans.  He has done incredibly well. He has taken treatment pretty much at full dose.  I'll plan to get back to see me in about 6 weeks. Volanda Napoleon, MD 10/5/201510:59 AM

## 2014-06-11 NOTE — Addendum Note (Signed)
Addended by: Burney Gauze R on: 06/11/2014 11:28 AM   Modules accepted: Orders

## 2014-06-11 NOTE — Patient Instructions (Signed)
Oxaliplatin Injection What is this medicine? OXALIPLATIN (ox AL i PLA tin) is a chemotherapy drug. It targets fast dividing cells, like cancer cells, and causes these cells to die. This medicine is used to treat cancers of the colon and rectum, and many other cancers. This medicine may be used for other purposes; ask your health care provider or pharmacist if you have questions. COMMON BRAND NAME(S): Eloxatin What should I tell my health care provider before I take this medicine? They need to know if you have any of these conditions: -kidney disease -an unusual or allergic reaction to oxaliplatin, other chemotherapy, other medicines, foods, dyes, or preservatives -pregnant or trying to get pregnant -breast-feeding How should I use this medicine? This drug is given as an infusion into a vein. It is administered in a hospital or clinic by a specially trained health care professional. Talk to your pediatrician regarding the use of this medicine in children. Special care may be needed. Overdosage: If you think you have taken too much of this medicine contact a poison control center or emergency room at once. NOTE: This medicine is only for you. Do not share this medicine with others. What if I miss a dose? It is important not to miss a dose. Call your doctor or health care professional if you are unable to keep an appointment. What may interact with this medicine? -medicines to increase blood counts like filgrastim, pegfilgrastim, sargramostim -probenecid -some antibiotics like amikacin, gentamicin, neomycin, polymyxin B, streptomycin, tobramycin -zalcitabine Talk to your doctor or health care professional before taking any of these medicines: -acetaminophen -aspirin -ibuprofen -ketoprofen -naproxen This list may not describe all possible interactions. Give your health care provider a list of all the medicines, herbs, non-prescription drugs, or dietary supplements you use. Also tell them if  you smoke, drink alcohol, or use illegal drugs. Some items may interact with your medicine. What should I watch for while using this medicine? Your condition will be monitored carefully while you are receiving this medicine. You will need important blood work done while you are taking this medicine. This medicine can make you more sensitive to cold. Do not drink cold drinks or use ice. Cover exposed skin before coming in contact with cold temperatures or cold objects. When out in cold weather wear warm clothing and cover your mouth and nose to warm the air that goes into your lungs. Tell your doctor if you get sensitive to the cold. This drug may make you feel generally unwell. This is not uncommon, as chemotherapy can affect healthy cells as well as cancer cells. Report any side effects. Continue your course of treatment even though you feel ill unless your doctor tells you to stop. In some cases, you may be given additional medicines to help with side effects. Follow all directions for their use. Call your doctor or health care professional for advice if you get a fever, chills or sore throat, or other symptoms of a cold or flu. Do not treat yourself. This drug decreases your body's ability to fight infections. Try to avoid being around people who are sick. This medicine may increase your risk to bruise or bleed. Call your doctor or health care professional if you notice any unusual bleeding. Be careful brushing and flossing your teeth or using a toothpick because you may get an infection or bleed more easily. If you have any dental work done, tell your dentist you are receiving this medicine. Avoid taking products that contain aspirin, acetaminophen, ibuprofen, naproxen,   or ketoprofen unless instructed by your doctor. These medicines may hide a fever. Do not become pregnant while taking this medicine. Women should inform their doctor if they wish to become pregnant or think they might be pregnant. There  is a potential for serious side effects to an unborn child. Talk to your health care professional or pharmacist for more information. Do not breast-feed an infant while taking this medicine. Call your doctor or health care professional if you get diarrhea. Do not treat yourself. What side effects may I notice from receiving this medicine? Side effects that you should report to your doctor or health care professional as soon as possible: -allergic reactions like skin rash, itching or hives, swelling of the face, lips, or tongue -low blood counts - This drug may decrease the number of white blood cells, red blood cells and platelets. You may be at increased risk for infections and bleeding. -signs of infection - fever or chills, cough, sore throat, pain or difficulty passing urine -signs of decreased platelets or bleeding - bruising, pinpoint red spots on the skin, black, tarry stools, nosebleeds -signs of decreased red blood cells - unusually weak or tired, fainting spells, lightheadedness -breathing problems -chest pain, pressure -cough -diarrhea -jaw tightness -mouth sores -nausea and vomiting -pain, swelling, redness or irritation at the injection site -pain, tingling, numbness in the hands or feet -problems with balance, talking, walking -redness, blistering, peeling or loosening of the skin, including inside the mouth -trouble passing urine or change in the amount of urine Side effects that usually do not require medical attention (report to your doctor or health care professional if they continue or are bothersome): -changes in vision -constipation -hair loss -loss of appetite -metallic taste in the mouth or changes in taste -stomach pain This list may not describe all possible side effects. Call your doctor for medical advice about side effects. You may report side effects to FDA at 1-800-FDA-1088. Where should I keep my medicine? This drug is given in a hospital or clinic and  will not be stored at home. NOTE: This sheet is a summary. It may not cover all possible information. If you have questions about this medicine, talk to your doctor, pharmacist, or health care provider.  2015, [employer]. (2008-03-20 17:22:47)  

## 2014-06-21 ENCOUNTER — Telehealth: Payer: Self-pay | Admitting: Hematology & Oncology

## 2014-06-21 NOTE — Telephone Encounter (Signed)
[facility] Chemo Approved  Valid: 06/07/2014-09/06/2014  Auth: [id number]  [id number] Oxaliplatin   Dx: Rica Mast  P: [phone] [id number] F: [phone]  Franchot Heidelberg, RN, BSN, CCM  Plan coverage efc: 06/07/2014 to current

## 2014-07-05 ENCOUNTER — Encounter: Payer: Self-pay | Admitting: *Deleted

## 2014-07-05 NOTE — Progress Notes (Signed)
Received a call from Dr Lumpton's office (dermatology) stating that patient arrived for his scheduled appointment - referral from Dr Marin Olp - but refused to pay his $45 co-pay. The office staff attempted to insurance coverage and co-pays, but ultimately patient left stating he didn't have to pay co-pay and he would be checking with his insurance himself.

## 2014-07-17 ENCOUNTER — Ambulatory Visit (HOSPITAL_BASED_OUTPATIENT_CLINIC_OR_DEPARTMENT_OTHER)
Admission: RE | Admit: 2014-07-17 | Discharge: 2014-07-17 | Disposition: A | Discharge status: Home / Self Care | Payer: Commercial Managed Care - PPO | Source: Ambulatory Visit | Attending: Hematology & Oncology | Admitting: Hematology & Oncology

## 2014-07-17 ENCOUNTER — Telehealth: Payer: Self-pay | Admitting: *Deleted

## 2014-07-17 DIAGNOSIS — K760 Fatty (change of) liver, not elsewhere classified: Secondary | ICD-10-CM | POA: Insufficient documentation

## 2014-07-17 DIAGNOSIS — C170 Malignant neoplasm of duodenum: Secondary | ICD-10-CM | POA: Diagnosis present

## 2014-07-17 DIAGNOSIS — K868 Other specified diseases of pancreas: Secondary | ICD-10-CM | POA: Insufficient documentation

## 2014-07-17 DIAGNOSIS — C178 Malignant neoplasm of overlapping sites of small intestine: Secondary | ICD-10-CM

## 2014-07-17 MED ORDER — IOHEXOL 300 MG/ML  SOLN
100.0000 mL | Freq: Once | INTRAMUSCULAR | Status: AC | PRN
  Administered 2014-07-17: 100 mL via INTRAVENOUS

## 2014-07-17 NOTE — Telephone Encounter (Addendum)
Patient happy with result.  ----- Message from Volanda Napoleon, MD sent at 07/17/2014  1:14 PM EST ----- Please call and let him know that the CT scan does not show any obvious cancer. This is a fantastic. Keith [name]

## 2014-07-23 ENCOUNTER — Encounter: Payer: Self-pay | Admitting: Hematology & Oncology

## 2014-07-23 ENCOUNTER — Other Ambulatory Visit (HOSPITAL_BASED_OUTPATIENT_CLINIC_OR_DEPARTMENT_OTHER): Payer: Commercial Managed Care - PPO | Admitting: Lab

## 2014-07-23 ENCOUNTER — Ambulatory Visit (HOSPITAL_BASED_OUTPATIENT_CLINIC_OR_DEPARTMENT_OTHER): Payer: Commercial Managed Care - PPO | Admitting: Hematology & Oncology

## 2014-07-23 VITALS — BP 139/95 | HR 74 | Temp 98.1°F | Resp 18 | Ht 66.0 in | Wt 127.0 lb

## 2014-07-23 DIAGNOSIS — C179 Malignant neoplasm of small intestine, unspecified: Secondary | ICD-10-CM

## 2014-07-23 DIAGNOSIS — R7989 Other specified abnormal findings of blood chemistry: Secondary | ICD-10-CM

## 2014-07-23 DIAGNOSIS — C779 Secondary and unspecified malignant neoplasm of lymph node, unspecified: Secondary | ICD-10-CM

## 2014-07-23 DIAGNOSIS — C178 Malignant neoplasm of overlapping sites of small intestine: Secondary | ICD-10-CM

## 2014-07-23 LAB — CBC WITH DIFFERENTIAL (CANCER CENTER ONLY)
BASO#: 0 10*3/uL (ref 0.0–0.2)
BASO%: 0 % (ref 0.0–2.0)
EOS%: 2.5 % (ref 0.0–7.0)
Eosinophils Absolute: 0.1 10*3/uL (ref 0.0–0.5)
HCT: 34.8 % — ABNORMAL LOW (ref 38.7–49.9)
HEMOGLOBIN: 11.4 g/dL — AB (ref 13.0–17.1)
LYMPH#: 0.7 10*3/uL — ABNORMAL LOW (ref 0.9–3.3)
LYMPH%: 21.2 % (ref 14.0–48.0)
MCH: 30.7 pg (ref 28.0–33.4)
MCHC: 32.8 g/dL (ref 32.0–35.9)
MCV: 94 fL (ref 82–98)
MONO#: 0.3 10*3/uL (ref 0.1–0.9)
MONO%: 8.3 % (ref 0.0–13.0)
NEUT%: 68 % (ref 40.0–80.0)
NEUTROS ABS: 2.2 10*3/uL (ref 1.5–6.5)
PLATELETS: 114 10*3/uL — AB (ref 145–400)
RBC: 3.71 10*6/uL — ABNORMAL LOW (ref 4.20–5.70)
RDW: 17.5 % — AB (ref 11.1–15.7)
WBC: 3.3 10*3/uL — ABNORMAL LOW (ref 4.0–10.0)

## 2014-07-23 LAB — CMP (CANCER CENTER ONLY)
ALBUMIN: 2.9 g/dL — AB (ref 3.3–5.5)
ALK PHOS: 158 U/L — AB (ref 26–84)
ALT: 38 U/L (ref 10–47)
AST: 38 U/L (ref 11–38)
BUN: 10 mg/dL (ref 7–22)
CO2: 26 mEq/L (ref 18–33)
Calcium: 8.6 mg/dL (ref 8.0–10.3)
Chloride: 107 mEq/L (ref 98–108)
Creat: 0.8 mg/dl (ref 0.6–1.2)
Glucose, Bld: 173 mg/dL — ABNORMAL HIGH (ref 73–118)
POTASSIUM: 4.4 meq/L (ref 3.3–4.7)
Sodium: 149 mEq/L — ABNORMAL HIGH (ref 128–145)
Total Bilirubin: 0.8 mg/dl (ref 0.20–1.60)
Total Protein: 5.7 g/dL — ABNORMAL LOW (ref 6.4–8.1)

## 2014-07-23 LAB — CEA: CEA: 3.6 ng/mL (ref 0.0–5.0)

## 2014-07-23 NOTE — Progress Notes (Signed)
Hematology and Oncology Follow Up Visit  Keith [name] [id number] 1971-01-01 43 y.o. 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 is back for followup. He is looking great. We did go ahead and get his CT scans done. Other done on November 10. There is no evidence of recurrence.  He goes down to [place] to see his surgeon this week. I called down to radiology in our building and they will put his CT scan on a disc.  [name] working without difficulty. [name] eating. He is having no problems with bowel movements. There is no abdominal pain. [name] had no cough. Has been no fever. [name] had no bleeding.He has a little bit of tingling in the fingers and toes.  His last CEA from October was 3.6.  Overall, his performance status is ECOG 1  Medications: Current outpatient prescriptions: lipase/protease/amylase (CREON) 36000 UNITS CPEP capsule, Take 1 capsule (36,000 Units total) by mouth 3 (three) times daily with meals., Disp: 90 capsule, Rfl: 3;  LORazepam (ATIVAN) 0.5 MG tablet, Take 1 tablet (0.5 mg total) by mouth every 6 (six) hours as needed (Nausea or vomiting)., Disp: 30 tablet, Rfl: 0  Allergies: No Known Allergies  Past Medical History, Surgical history, Social history, and Family History were reviewed and updated.  Review of Systems: As above  Physical Exam:  height is 5\' 6"  (1.676 m) and weight is 127 lb (57.607 kg). His oral temperature is 98.1 F (36.7 C). His blood pressure is 139/95 and his pulse is 74. His respiration is 18.   Lungs are clear. Head exam shows no scleral icterus. There is no oral lesion. Oral mucosa is moist. There is no adenopathy in the neck. Cardiac exam regular and rhythm with no murmurs rubs or bruits. Abdomen is soft. Well-healed upper-outer a scar. He has no fluid wave. There is no guarding or rebound tenderness.  There is no palpable liver or spleen tip. Back exam no tenderness of the spine, ribs or hips. Extremities shows no clubbing, cyanosis or edema. Skin exam no rashes no ecchymosis. Neurological exam is nonfocal.  Lab Results  Component Value Date   WBC 3.3* 07/23/2014   HGB 11.4* 07/23/2014   HCT 34.8* 07/23/2014   MCV 94 07/23/2014   PLT 114* 07/23/2014     Chemistry      Component Value Date/Time   NA 149* 07/23/2014 0946   NA 142 03/05/2014 1631   NA 142 02/26/2014 1555   K 4.4 07/23/2014 0946   K 4.5 03/05/2014 1631   K 4.2 02/26/2014 1555   CL 107 07/23/2014 0946   CL 105 03/05/2014 1631   CO2 26 07/23/2014 0946   CO2 27 03/05/2014 1631   CO2 25 02/26/2014 1555   BUN 10 07/23/2014 0946   BUN 10 03/05/2014 1631   BUN 11.9 02/26/2014 1555   CREATININE 0.8 07/23/2014 0946   CREATININE 0.84 03/05/2014 1631   CREATININE 0.7 02/26/2014 1555      Component Value Date/Time   CALCIUM 8.6 07/23/2014 0946   CALCIUM 8.5 03/05/2014 1631   CALCIUM 8.5 02/26/2014 1555   ALKPHOS 158* 07/23/2014 0946   ALKPHOS 114 03/05/2014 1631   ALKPHOS 114 02/26/2014 1555   AST 38 07/23/2014 0946   AST 24 03/05/2014 1631   AST 21 02/26/2014 1555   ALT 38 07/23/2014 0946  ALT 15 03/05/2014 1631   ALT 13 02/26/2014 1555   BILITOT 0.80 07/23/2014 0946   BILITOT 0.6 03/05/2014 1631   BILITOT 0.91 02/26/2014 1555         Impression and Plan: Keith [name] is 43 year old gentleman. He has a locally advanced small bowel cancer. He underwent resection. He has 9 positive lymph nodes. As such, he is at risk for recurrence.  The CT scans are reassuring for right now. We do not see any evidence of recurrent disease.  However, he is at significant risk for recurrence because of the 9 positive lymph nodes.  We will go ahead and get another scan on him in 3 months.  His liver tests are slightly elevated. I think this still might be a residual effect from the chemotherapy. He is a symptomatic with  this.  We will go ahead and get the scans the same day that we see him in February 2016. Volanda Napoleon, MD 11/16/201510:54 AM

## 2014-07-26 ENCOUNTER — Telehealth: Payer: Self-pay | Admitting: Hematology & Oncology

## 2014-07-26 NOTE — Telephone Encounter (Signed)
Faxed medical records today to:  [facility]  P: [phone] F: [phone]      COPY SCANNED

## 2014-08-09 ENCOUNTER — Encounter: Payer: Self-pay | Admitting: Hematology & Oncology

## 2014-09-03 ENCOUNTER — Encounter: Payer: Self-pay | Admitting: *Deleted

## 2014-09-03 NOTE — Progress Notes (Signed)
Received notice that CT scans scheduled for 10/23/14 have been approved  Reference Number [id number]

## 2014-10-23 ENCOUNTER — Encounter (HOSPITAL_BASED_OUTPATIENT_CLINIC_OR_DEPARTMENT_OTHER): Payer: Self-pay

## 2014-10-23 ENCOUNTER — Ambulatory Visit (HOSPITAL_BASED_OUTPATIENT_CLINIC_OR_DEPARTMENT_OTHER): Payer: Commercial Managed Care - PPO | Admitting: Hematology & Oncology

## 2014-10-23 ENCOUNTER — Ambulatory Visit (HOSPITAL_BASED_OUTPATIENT_CLINIC_OR_DEPARTMENT_OTHER)
Admission: RE | Admit: 2014-10-23 | Discharge: 2014-10-23 | Disposition: A | Discharge status: Home / Self Care | Payer: Commercial Managed Care - PPO | Source: Ambulatory Visit | Attending: Hematology & Oncology | Admitting: Hematology & Oncology

## 2014-10-23 ENCOUNTER — Other Ambulatory Visit (HOSPITAL_BASED_OUTPATIENT_CLINIC_OR_DEPARTMENT_OTHER): Payer: Commercial Managed Care - PPO | Admitting: Lab

## 2014-10-23 ENCOUNTER — Encounter: Payer: Self-pay | Admitting: Hematology & Oncology

## 2014-10-23 VITALS — BP 140/95 | HR 69 | Temp 98.0°F | Resp 18 | Ht 66.0 in | Wt 131.0 lb

## 2014-10-23 DIAGNOSIS — C178 Malignant neoplasm of overlapping sites of small intestine: Secondary | ICD-10-CM

## 2014-10-23 DIAGNOSIS — Z923 Personal history of irradiation: Secondary | ICD-10-CM | POA: Insufficient documentation

## 2014-10-23 DIAGNOSIS — Z85068 Personal history of other malignant neoplasm of small intestine: Secondary | ICD-10-CM | POA: Diagnosis not present

## 2014-10-23 DIAGNOSIS — C179 Malignant neoplasm of small intestine, unspecified: Secondary | ICD-10-CM

## 2014-10-23 DIAGNOSIS — B181 Chronic viral hepatitis B without delta-agent: Secondary | ICD-10-CM | POA: Insufficient documentation

## 2014-10-23 DIAGNOSIS — Z9889 Other specified postprocedural states: Secondary | ICD-10-CM | POA: Insufficient documentation

## 2014-10-23 DIAGNOSIS — Z9221 Personal history of antineoplastic chemotherapy: Secondary | ICD-10-CM | POA: Diagnosis not present

## 2014-10-23 DIAGNOSIS — R7989 Other specified abnormal findings of blood chemistry: Secondary | ICD-10-CM | POA: Diagnosis not present

## 2014-10-23 LAB — CBC WITH DIFFERENTIAL (CANCER CENTER ONLY)
BASO#: 0 10*3/uL (ref 0.0–0.2)
BASO%: 0.3 % (ref 0.0–2.0)
EOS ABS: 0.2 10*3/uL (ref 0.0–0.5)
EOS%: 4.5 % (ref 0.0–7.0)
HCT: 37.6 % — ABNORMAL LOW (ref 38.7–49.9)
HEMOGLOBIN: 12.5 g/dL — AB (ref 13.0–17.1)
LYMPH#: 1.3 10*3/uL (ref 0.9–3.3)
LYMPH%: 34.8 % (ref 14.0–48.0)
MCH: 28.7 pg (ref 28.0–33.4)
MCHC: 33.2 g/dL (ref 32.0–35.9)
MCV: 86 fL (ref 82–98)
MONO#: 0.3 10*3/uL (ref 0.1–0.9)
MONO%: 7.5 % (ref 0.0–13.0)
NEUT#: 1.9 10*3/uL (ref 1.5–6.5)
NEUT%: 52.9 % (ref 40.0–80.0)
Platelets: 120 10*3/uL — ABNORMAL LOW (ref 145–400)
RBC: 4.36 10*6/uL (ref 4.20–5.70)
RDW: 13.5 % (ref 11.1–15.7)
WBC: 3.6 10*3/uL — ABNORMAL LOW (ref 4.0–10.0)

## 2014-10-23 LAB — CMP (CANCER CENTER ONLY)
ALK PHOS: 156 U/L — AB (ref 26–84)
ALT(SGPT): 32 U/L (ref 10–47)
AST: 39 U/L — ABNORMAL HIGH (ref 11–38)
Albumin: 3.4 g/dL (ref 3.3–5.5)
BUN, Bld: 10 mg/dL (ref 7–22)
CO2: 28 mEq/L (ref 18–33)
Calcium: 9 mg/dL (ref 8.0–10.3)
Chloride: 103 mEq/L (ref 98–108)
Creat: 0.8 mg/dl (ref 0.6–1.2)
Glucose, Bld: 105 mg/dL (ref 73–118)
POTASSIUM: 4.4 meq/L (ref 3.3–4.7)
SODIUM: 143 meq/L (ref 128–145)
TOTAL PROTEIN: 6.3 g/dL — AB (ref 6.4–8.1)
Total Bilirubin: 0.7 mg/dl (ref 0.20–1.60)

## 2014-10-23 MED ORDER — IOHEXOL 300 MG/ML  SOLN
100.0000 mL | Freq: Once | INTRAMUSCULAR | Status: AC | PRN
  Administered 2014-10-23: 100 mL via INTRAVENOUS

## 2014-10-23 NOTE — Progress Notes (Signed)
Hematology and Oncology Follow Up Visit  Keith [name] [id number] [birth year] 44 y.o. 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 is back for followup. He is looking great. We did go ahead and get his CT scans done. There is no evidence of recurrent disease.  He completed all this chemotherapy back in early October 2015.[name]  He went down to [place] to see his surgeon a couple months ago. Everything looked good down there. The surgeon does not need to see him back.  [name] working without difficulty. [name] eating well. He is having no problems with bowel movements. There is some occasional abdominal pain. There is nothing related to eating. Nothing related to bowel or bladder habits.  [name] had no cough. Has been no fever. [name] had no bleeding.He still has a little bit of tingling in the fingers and toes.  His last CEA from November was 3.6.    Overall, his performance status is ECOG 1  Medications:  Current outpatient prescriptions:  .  lipase/protease/amylase (CREON) 36000 UNITS CPEP capsule, Take 1 capsule (36,000 Units total) by mouth 3 (three) times daily with meals., Disp: 90 capsule, Rfl: 3 .  LORazepam (ATIVAN) 0.5 MG tablet, Take 1 tablet (0.5 mg total) by mouth every 6 (six) hours as needed (Nausea or vomiting)., Disp: 30 tablet, Rfl: 0  Allergies: No Known Allergies  Past Medical History, Surgical history, Social history, and Family History were reviewed and updated.  Review of Systems: As above  Physical Exam:  height is 5\' 6"  (1.676 m) and weight is 131 lb (59.421 kg). His oral temperature is 98 F (36.7 C). His blood pressure is 140/95 and his pulse is 69. His respiration is 18.   Lungs are clear. Head exam shows no scleral icterus. There is no oral lesion. Oral mucosa is moist. There is no adenopathy in the neck. Cardiac exam  regular and rhythm with no murmurs rubs or bruits. Abdomen is soft. Well-healed laparotomy scar in the periumbilical region. He has no fluid wave. There is no guarding or rebound tenderness. There is no palpable liver or spleen tip. Back exam shows no tenderness of the spine, ribs or hips. Extremities shows no clubbing, cyanosis or edema. Skin exam shows no rashes no ecchymosis. Neurological exam is nonfocal.  Lab Results  Component Value Date   WBC 3.6* 10/23/2014   HGB 12.5* 10/23/2014   HCT 37.6* 10/23/2014   MCV 86 10/23/2014   PLT 120* 10/23/2014     Chemistry      Component Value Date/Time   NA 143 10/23/2014 0927   NA 142 03/05/2014 1631   NA 142 02/26/2014 1555   K 4.4 10/23/2014 0927   K 4.5 03/05/2014 1631   K 4.2 02/26/2014 1555   CL 103 10/23/2014 0927   CL 105 03/05/2014 1631   CO2 28 10/23/2014 0927   CO2 27 03/05/2014 1631   CO2 25 02/26/2014 1555   BUN 10 10/23/2014 0927   BUN 10 03/05/2014 1631   BUN 11.9 02/26/2014 1555   CREATININE 0.8 10/23/2014 0927   CREATININE 0.84 03/05/2014 1631   CREATININE 0.7 02/26/2014 1555      Component Value Date/Time   CALCIUM 9.0 10/23/2014 0927   CALCIUM 8.5 03/05/2014 1631   CALCIUM 8.5 02/26/2014 1555   ALKPHOS 156* 10/23/2014 0927  ALKPHOS 114 03/05/2014 1631   ALKPHOS 114 02/26/2014 1555   AST 39* 10/23/2014 0927   AST 24 03/05/2014 1631   AST 21 02/26/2014 1555   ALT 32 10/23/2014 0927   ALT 15 03/05/2014 1631   ALT 13 02/26/2014 1555   BILITOT 0.70 10/23/2014 0927   BILITOT 0.6 03/05/2014 1631   BILITOT 0.91 02/26/2014 1555         Impression and Plan: Keith [name] is 44 year old gentleman. He has a locally advanced small bowel cancer. He underwent resection. He has 9 positive lymph nodes. As such, he is at risk for recurrence.  The CT scans are reassuring for right now. We do not see any evidence of recurrent disease.  However, he is at significant risk for recurrence because of the 9 positive lymph  nodes.  We will go ahead and get another scan on him in 4 months.  His liver tests are slightly elevated. I think this still might be a residual effect from the chemotherapy. He is asymptomatic with this.  We will go ahead and get the scans the same day that we see him in June 2016. Volanda Napoleon, MD 2/16/201610:17 AM

## 2014-10-24 LAB — CEA: CEA: 3.2 ng/mL (ref 0.0–5.0)

## 2014-11-29 ENCOUNTER — Other Ambulatory Visit: Payer: Self-pay | Admitting: Family

## 2015-02-05 ENCOUNTER — Ambulatory Visit (HOSPITAL_BASED_OUTPATIENT_CLINIC_OR_DEPARTMENT_OTHER): Payer: Commercial Managed Care - PPO | Admitting: Family

## 2015-02-05 ENCOUNTER — Other Ambulatory Visit (HOSPITAL_BASED_OUTPATIENT_CLINIC_OR_DEPARTMENT_OTHER): Payer: Commercial Managed Care - PPO

## 2015-02-05 ENCOUNTER — Ambulatory Visit (HOSPITAL_BASED_OUTPATIENT_CLINIC_OR_DEPARTMENT_OTHER)
Admission: RE | Admit: 2015-02-05 | Discharge: 2015-02-05 | Disposition: A | Discharge status: Home / Self Care | Payer: Commercial Managed Care - PPO | Source: Ambulatory Visit | Attending: Hematology & Oncology | Admitting: Hematology & Oncology

## 2015-02-05 ENCOUNTER — Encounter: Payer: Self-pay | Admitting: Family

## 2015-02-05 ENCOUNTER — Encounter (HOSPITAL_BASED_OUTPATIENT_CLINIC_OR_DEPARTMENT_OTHER): Payer: Self-pay

## 2015-02-05 VITALS — BP 133/89 | HR 57 | Temp 97.7°F | Resp 18 | Wt 126.0 lb

## 2015-02-05 DIAGNOSIS — C179 Malignant neoplasm of small intestine, unspecified: Secondary | ICD-10-CM | POA: Diagnosis not present

## 2015-02-05 DIAGNOSIS — C178 Malignant neoplasm of overlapping sites of small intestine: Secondary | ICD-10-CM

## 2015-02-05 DIAGNOSIS — Z85068 Personal history of other malignant neoplasm of small intestine: Secondary | ICD-10-CM

## 2015-02-05 DIAGNOSIS — R911 Solitary pulmonary nodule: Secondary | ICD-10-CM | POA: Insufficient documentation

## 2015-02-05 LAB — CBC WITH DIFFERENTIAL (CANCER CENTER ONLY)
BASO#: 0 10*3/uL (ref 0.0–0.2)
BASO%: 0.3 % (ref 0.0–2.0)
EOS%: 5.2 % (ref 0.0–7.0)
Eosinophils Absolute: 0.2 10*3/uL (ref 0.0–0.5)
HCT: 37.6 % — ABNORMAL LOW (ref 38.7–49.9)
HGB: 12.7 g/dL — ABNORMAL LOW (ref 13.0–17.1)
LYMPH#: 0.9 10*3/uL (ref 0.9–3.3)
LYMPH%: 26.5 % (ref 14.0–48.0)
MCH: 30 pg (ref 28.0–33.4)
MCHC: 33.8 g/dL (ref 32.0–35.9)
MCV: 89 fL (ref 82–98)
MONO#: 0.3 10*3/uL (ref 0.1–0.9)
MONO%: 9 % (ref 0.0–13.0)
NEUT#: 2 10*3/uL (ref 1.5–6.5)
NEUT%: 59 % (ref 40.0–80.0)
Platelets: 106 10*3/uL — ABNORMAL LOW (ref 145–400)
RBC: 4.24 10*6/uL (ref 4.20–5.70)
RDW: 13.6 % (ref 11.1–15.7)
WBC: 3.4 10*3/uL — ABNORMAL LOW (ref 4.0–10.0)

## 2015-02-05 LAB — COMPREHENSIVE METABOLIC PANEL
ALK PHOS: 144 U/L — AB (ref 39–117)
ALT: 45 U/L (ref 0–53)
AST: 49 U/L — ABNORMAL HIGH (ref 0–37)
Albumin: 3.7 g/dL (ref 3.5–5.2)
BILIRUBIN TOTAL: 0.7 mg/dL (ref 0.2–1.2)
BUN: 10 mg/dL (ref 6–23)
CO2: 27 meq/L (ref 19–32)
CREATININE: 0.84 mg/dL (ref 0.50–1.35)
Calcium: 8.5 mg/dL (ref 8.4–10.5)
Chloride: 107 mEq/L (ref 96–112)
GLUCOSE: 85 mg/dL (ref 70–99)
Potassium: 4.6 mEq/L (ref 3.5–5.3)
Sodium: 141 mEq/L (ref 135–145)
Total Protein: 5.5 g/dL — ABNORMAL LOW (ref 6.0–8.3)

## 2015-02-05 LAB — CEA: CEA: 3.4 ng/mL (ref 0.0–5.0)

## 2015-02-05 MED ORDER — IOHEXOL 300 MG/ML  SOLN
100.0000 mL | Freq: Once | INTRAMUSCULAR | Status: AC | PRN
  Administered 2015-02-05: 100 mL via INTRAVENOUS

## 2015-02-05 NOTE — Progress Notes (Signed)
Hematology and Oncology Follow Up Visit  Elishah Ashmore [id number] June 20, 1971 44 y.o. 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 Hemp is here today for a follow-up. He is doing quite well. He has had no issues since we saw him in February.  He completed chemotherapy in October 2015.  His Ct scan results today showed no evidence of metastatic disease. He has had no new aches or pains.  No problem with infections. He denies fever, chills, n/v, cough, rash, dizziness, SOB, chest pain, palpitations, abdominal pain, constipation, diarrhea, blood in urine or stool.  He is taking Creon daily with meals. He would like to be gaining more weight. He is going to increase his calorie count and try Ensure with meals to see if this helps. His weight today is 126 lbs.  He is drinking plenty of fluids to stay hydrated.  He has had no swelling, tenderness, numbness or tingling in his extremities.   Medications:    Medication List       This list is accurate as of: 02/05/15  2:17 PM.  Always use your most recent med list.               lipase/protease/amylase 36000 UNITS Cpep capsule  Commonly known as:  CREON  Take 1 capsule (36,000 Units total) by mouth 3 (three) times daily with meals.        Allergies: No Known Allergies  Past Medical History, Surgical history, Social history, and Family History were reviewed and updated.  Review of Systems: All other 10 point review of systems is negative.   Physical Exam:  weight is 126 lb (57.153 kg). His oral temperature is 97.7 F (36.5 C). His blood pressure is 133/89 and his pulse is 57. His respiration is 18.   Wt Readings from Last 3 Encounters:  02/05/15 126 lb (57.153 kg)  10/23/14 131 lb (59.421 kg)  07/23/14 127 lb (57.607 kg)    Ocular: Sclerae unicteric, pupils equal, round and reactive to light Ear-nose-throat: Oropharynx clear,  dentition fair Lymphatic: No cervical or supraclavicular adenopathy Lungs no rales or rhonchi, good excursion bilaterally Heart regular rate and rhythm, no murmur appreciated Abd soft, nontender, positive bowel sounds MSK no focal spinal tenderness, no joint edema Neuro: non-focal, well-oriented, appropriate affect  Lab Results  Component Value Date   WBC 3.4* 02/05/2015   HGB 12.7* 02/05/2015   HCT 37.6* 02/05/2015   MCV 89 02/05/2015   PLT 106* 02/05/2015   No results found for: FERRITIN, IRON, TIBC, UIBC, IRONPCTSAT Lab Results  Component Value Date   RBC 4.24 02/05/2015   No results found for: KPAFRELGTCHN, LAMBDASER, KAPLAMBRATIO No results found for: IGGSERUM, IGA, IGMSERUM No results found for: Odetta Pink, SPEI   Chemistry      Component Value Date/Time   NA 143 10/23/2014 0927   NA 142 03/05/2014 1631   NA 142 02/26/2014 1555   K 4.4 10/23/2014 0927   K 4.5 03/05/2014 1631   K 4.2 02/26/2014 1555   CL 103 10/23/2014 0927   CL 105 03/05/2014 1631   CO2 28 10/23/2014 0927   CO2 27 03/05/2014 1631   CO2 25 02/26/2014 1555   BUN 10 10/23/2014 0927   BUN 10 03/05/2014 1631   BUN 11.9 02/26/2014 1555   CREATININE 0.8 10/23/2014 0927   CREATININE 0.84 03/05/2014 1631  CREATININE 0.7 02/26/2014 1555      Component Value Date/Time   CALCIUM 9.0 10/23/2014 0927   CALCIUM 8.5 03/05/2014 1631   CALCIUM 8.5 02/26/2014 1555   ALKPHOS 156* 10/23/2014 0927   ALKPHOS 114 03/05/2014 1631   ALKPHOS 114 02/26/2014 1555   AST 39* 10/23/2014 0927   AST 24 03/05/2014 1631   AST 21 02/26/2014 1555   ALT 32 10/23/2014 0927   ALT 15 03/05/2014 1631   ALT 13 02/26/2014 1555   BILITOT 0.70 10/23/2014 0927   BILITOT 0.6 03/05/2014 1631   BILITOT 0.91 02/26/2014 1555     Impression and Plan: Mr. Viglione is 44 year old gentleman with history of locally advanced small bowel cancer. He underwent resection and has 9 positive  lymph nodes. He completed chemotherapy in October 2015.  We continue to follow along with him closely. He is certainly at risk for recurrence.  He will continue taking Creon with meals.  His CT scan today showed no evidence of recurrence. He is asymptomatic at this time.  We will plan to see him back in 4 months and repeat scans that same day.  He knows to call us with any questions or concerns. We can certainly see him sooner if need be.   Eliezer Bottom, NP 5/31/20162:17 PM

## 2015-02-14 ENCOUNTER — Other Ambulatory Visit: Payer: Self-pay | Admitting: Hematology & Oncology

## 2015-04-26 ENCOUNTER — Other Ambulatory Visit: Payer: Self-pay | Admitting: Nurse Practitioner

## 2015-04-26 MED ORDER — PANCRELIPASE (LIP-PROT-AMYL) 36000-114000 UNITS PO CPEP: ORAL_CAPSULE | ORAL | Status: DC

## 2015-05-08 ENCOUNTER — Ambulatory Visit (HOSPITAL_BASED_OUTPATIENT_CLINIC_OR_DEPARTMENT_OTHER)
Admission: RE | Admit: 2015-05-08 | Discharge: 2015-05-08 | Disposition: A | Discharge status: Home / Self Care | Payer: Commercial Managed Care - PPO | Source: Ambulatory Visit | Attending: Family | Admitting: Family

## 2015-05-08 ENCOUNTER — Encounter (HOSPITAL_BASED_OUTPATIENT_CLINIC_OR_DEPARTMENT_OTHER): Payer: Self-pay

## 2015-05-08 ENCOUNTER — Ambulatory Visit (HOSPITAL_BASED_OUTPATIENT_CLINIC_OR_DEPARTMENT_OTHER): Payer: Commercial Managed Care - PPO | Admitting: Hematology & Oncology

## 2015-05-08 ENCOUNTER — Other Ambulatory Visit (HOSPITAL_BASED_OUTPATIENT_CLINIC_OR_DEPARTMENT_OTHER): Payer: Commercial Managed Care - PPO

## 2015-05-08 VITALS — BP 135/75 | HR 82 | Temp 97.8°F | Wt 128.4 lb

## 2015-05-08 DIAGNOSIS — R911 Solitary pulmonary nodule: Secondary | ICD-10-CM | POA: Diagnosis not present

## 2015-05-08 DIAGNOSIS — C178 Malignant neoplasm of overlapping sites of small intestine: Secondary | ICD-10-CM

## 2015-05-08 DIAGNOSIS — Z9889 Other specified postprocedural states: Secondary | ICD-10-CM | POA: Insufficient documentation

## 2015-05-08 DIAGNOSIS — Z85068 Personal history of other malignant neoplasm of small intestine: Secondary | ICD-10-CM

## 2015-05-08 LAB — CEA: CEA: 3.2 ng/mL (ref 0.0–5.0)

## 2015-05-08 LAB — COMPREHENSIVE METABOLIC PANEL
ALBUMIN: 3.6 g/dL (ref 3.6–5.1)
ALT: 21 U/L (ref 9–46)
AST: 29 U/L (ref 10–40)
Alkaline Phosphatase: 161 U/L — ABNORMAL HIGH (ref 40–115)
BUN: 11 mg/dL (ref 7–25)
CO2: 24 mmol/L (ref 20–31)
Calcium: 8.7 mg/dL (ref 8.6–10.3)
Chloride: 102 mmol/L (ref 98–110)
Creatinine, Ser: 0.88 mg/dL (ref 0.60–1.35)
Glucose, Bld: 82 mg/dL (ref 65–99)
Potassium: 4.7 mmol/L (ref 3.5–5.3)
SODIUM: 139 mmol/L (ref 135–146)
TOTAL PROTEIN: 6 g/dL — AB (ref 6.1–8.1)
Total Bilirubin: 0.6 mg/dL (ref 0.2–1.2)

## 2015-05-08 LAB — CBC WITH DIFFERENTIAL (CANCER CENTER ONLY)
BASO#: 0 10*3/uL (ref 0.0–0.2)
BASO%: 0.6 % (ref 0.0–2.0)
EOS%: 4.2 % (ref 0.0–7.0)
Eosinophils Absolute: 0.2 10*3/uL (ref 0.0–0.5)
HCT: 40.4 % (ref 38.7–49.9)
HEMOGLOBIN: 13.4 g/dL (ref 13.0–17.1)
LYMPH#: 1.1 10*3/uL (ref 0.9–3.3)
LYMPH%: 31.3 % (ref 14.0–48.0)
MCH: 29.9 pg (ref 28.0–33.4)
MCHC: 33.2 g/dL (ref 32.0–35.9)
MCV: 90 fL (ref 82–98)
MONO#: 0.3 10*3/uL (ref 0.1–0.9)
MONO%: 9.4 % (ref 0.0–13.0)
NEUT%: 54.5 % (ref 40.0–80.0)
NEUTROS ABS: 2 10*3/uL (ref 1.5–6.5)
Platelets: 117 10*3/uL — ABNORMAL LOW (ref 145–400)
RBC: 4.48 10*6/uL (ref 4.20–5.70)
RDW: 13.7 % (ref 11.1–15.7)
WBC: 3.6 10*3/uL — AB (ref 4.0–10.0)

## 2015-05-08 MED ORDER — IOHEXOL 300 MG/ML  SOLN
100.0000 mL | Freq: Once | INTRAMUSCULAR | Status: AC | PRN
  Administered 2015-05-08: 100 mL via INTRAVENOUS

## 2015-05-08 NOTE — Progress Notes (Signed)
Hematology and Oncology Follow Up Visit  Keith [name] [id number] [birth year] 44 y.o. 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 is back for followup. He has had a great summer. He and his family went to [facility] for vacation. He had a very good time there.  He is working. [name] having no problems with work.  We did go ahead and repeat a CT scan on him. This did not show any evidence of recurrent disease. He had a stable nonspecific 5 mm nodule in the right upper lobe.  His last CEA was 3.4.  His appetite has been good. He is on Creon to help with digestion.  [name] had no diarrhea.  [name] had no rashes. The been no leg swelling. [name] had no nausea or vomiting. [name] had no dysuria or hematuria. bleeding.He still has a little bit of tingling in the fingers and toes.  Overall, his performance status is ECOG 1  Medications:  Current outpatient prescriptions:  .  lipase/protease/amylase (CREON) 36000 UNITS CPEP capsule, TAKE 1 CAPSULE BY MOUTH THREE TIMES DAILY WITH MEALS, Disp: 90 capsule, Rfl: 6  Allergies: No Known Allergies  Past Medical History, Surgical history, Social history, and Family History were reviewed and updated.  Review of Systems: As above  Physical Exam:  weight is 128 lb 6.4 oz (58.242 kg). His oral temperature is 97.8 F (36.6 C). His blood pressure is 135/75 and his pulse is 82.   Lungs are clear. Head exam shows no scleral icterus. There is no oral lesion. Oral mucosa is moist. There is no adenopathy in the neck. Cardiac exam regular and rhythm with no murmurs rubs or bruits. Abdomen is soft. Well-healed laparotomy scar in the periumbilical region. He has no fluid wave. There is no guarding or rebound tenderness. There is no palpable liver or spleen tip. Back exam shows no tenderness of the spine, ribs or hips.  Extremities shows no clubbing, cyanosis or edema. Skin exam shows no rashes no ecchymosis. Neurological exam is nonfocal.  Lab Results  Component Value Date   WBC 3.6* 05/08/2015   HGB 13.4 05/08/2015   HCT 40.4 05/08/2015   MCV 90 05/08/2015   PLT 117* 05/08/2015     Chemistry      Component Value Date/Time   NA 141 02/05/2015 0941   NA 143 10/23/2014 0927   NA 142 02/26/2014 1555   K 4.6 02/05/2015 0941   K 4.4 10/23/2014 0927   K 4.2 02/26/2014 1555   CL 107 02/05/2015 0941   CL 103 10/23/2014 0927   CO2 27 02/05/2015 0941   CO2 28 10/23/2014 0927   CO2 25 02/26/2014 1555   BUN 10 02/05/2015 0941   BUN 10 10/23/2014 0927   BUN 11.9 02/26/2014 1555   CREATININE 0.84 02/05/2015 0941   CREATININE 0.8 10/23/2014 0927   CREATININE 0.7 02/26/2014 1555      Component Value Date/Time   CALCIUM 8.5 02/05/2015 0941   CALCIUM 9.0 10/23/2014 0927   CALCIUM 8.5 02/26/2014 1555   ALKPHOS 144* 02/05/2015 0941   ALKPHOS 156* 10/23/2014 0927   ALKPHOS 114 02/26/2014 1555   AST 49* 02/05/2015 0941   AST 39* 10/23/2014 0927   AST 21 02/26/2014 1555   ALT 45 02/05/2015 0941   ALT 32 10/23/2014 0927   ALT 13 02/26/2014  1555   BILITOT 0.7 02/05/2015 0941   BILITOT 0.70 10/23/2014 0927   BILITOT 0.91 02/26/2014 1555         Impression and Plan: Keith [name] is 44 year old gentleman. He has a locally advanced small bowel cancer. He underwent resection. He has 9 positive lymph nodes. As such, he is at risk for recurrence.  The CT scans are reassuring for right now. We do not see any evidence of recurrent disease.  However, he is at significant risk for recurrence because of the 9 positive lymph nodes.  We will go ahead and get another scan on him in 4 months.   We will go ahead and get the scans the same day that we see him in December 2016.   Volanda Napoleon, MD 8/31/20169:48 AM

## 2015-09-05 ENCOUNTER — Encounter (HOSPITAL_BASED_OUTPATIENT_CLINIC_OR_DEPARTMENT_OTHER): Payer: Self-pay

## 2015-09-05 ENCOUNTER — Other Ambulatory Visit (HOSPITAL_BASED_OUTPATIENT_CLINIC_OR_DEPARTMENT_OTHER): Payer: Commercial Managed Care - PPO

## 2015-09-05 ENCOUNTER — Ambulatory Visit (HOSPITAL_BASED_OUTPATIENT_CLINIC_OR_DEPARTMENT_OTHER): Payer: Commercial Managed Care - PPO | Admitting: Hematology & Oncology

## 2015-09-05 ENCOUNTER — Ambulatory Visit (HOSPITAL_BASED_OUTPATIENT_CLINIC_OR_DEPARTMENT_OTHER)
Admission: RE | Admit: 2015-09-05 | Discharge: 2015-09-05 | Disposition: A | Discharge status: Home / Self Care | Payer: Commercial Managed Care - PPO | Source: Ambulatory Visit | Attending: Hematology & Oncology | Admitting: Hematology & Oncology

## 2015-09-05 ENCOUNTER — Encounter: Payer: Self-pay | Admitting: Hematology & Oncology

## 2015-09-05 VITALS — BP 127/86 | HR 55 | Temp 98.2°F | Resp 16 | Wt 133.0 lb

## 2015-09-05 DIAGNOSIS — Z903 Acquired absence of stomach [part of]: Secondary | ICD-10-CM | POA: Insufficient documentation

## 2015-09-05 DIAGNOSIS — B181 Chronic viral hepatitis B without delta-agent: Secondary | ICD-10-CM | POA: Insufficient documentation

## 2015-09-05 DIAGNOSIS — C779 Secondary and unspecified malignant neoplasm of lymph node, unspecified: Secondary | ICD-10-CM

## 2015-09-05 DIAGNOSIS — K760 Fatty (change of) liver, not elsewhere classified: Secondary | ICD-10-CM | POA: Insufficient documentation

## 2015-09-05 DIAGNOSIS — C178 Malignant neoplasm of overlapping sites of small intestine: Secondary | ICD-10-CM | POA: Diagnosis present

## 2015-09-05 DIAGNOSIS — C179 Malignant neoplasm of small intestine, unspecified: Secondary | ICD-10-CM | POA: Diagnosis not present

## 2015-09-05 DIAGNOSIS — R911 Solitary pulmonary nodule: Secondary | ICD-10-CM | POA: Insufficient documentation

## 2015-09-05 LAB — COMPREHENSIVE METABOLIC PANEL
ALBUMIN: 3.7 g/dL (ref 3.5–5.0)
ALK PHOS: 135 U/L (ref 40–150)
ALT: 28 U/L (ref 0–55)
AST: 27 U/L (ref 5–34)
Anion Gap: 5 mEq/L (ref 3–11)
BUN: 12.5 mg/dL (ref 7.0–26.0)
CALCIUM: 8.6 mg/dL (ref 8.4–10.4)
CO2: 28 mEq/L (ref 22–29)
CREATININE: 0.9 mg/dL (ref 0.7–1.3)
Chloride: 106 mEq/L (ref 98–109)
EGFR: >90 mL/min/{1.73_m2} (ref >90)
Glucose: 82 mg/dl (ref 70–140)
Potassium: 4.6 mEq/L (ref 3.5–5.1)
Sodium: 139 mEq/L (ref 136–145)
TOTAL PROTEIN: 6.2 g/dL — AB (ref 6.4–8.3)
Total Bilirubin: 0.8 mg/dL (ref 0.20–1.20)

## 2015-09-05 LAB — CBC WITH DIFFERENTIAL (CANCER CENTER ONLY)
BASO#: 0 10*3/uL (ref 0.0–0.2)
BASO%: 0.3 % (ref 0.0–2.0)
EOS%: 5 % (ref 0.0–7.0)
Eosinophils Absolute: 0.2 10*3/uL (ref 0.0–0.5)
HEMATOCRIT: 41.1 % (ref 38.7–49.9)
HEMOGLOBIN: 13.8 g/dL (ref 13.0–17.1)
LYMPH#: 1.2 10*3/uL (ref 0.9–3.3)
LYMPH%: 32 % (ref 14.0–48.0)
MCH: 30.1 pg (ref 28.0–33.4)
MCHC: 33.6 g/dL (ref 32.0–35.9)
MCV: 90 fL (ref 82–98)
MONO#: 0.3 10*3/uL (ref 0.1–0.9)
MONO%: 7.7 % (ref 0.0–13.0)
NEUT#: 2.1 10*3/uL (ref 1.5–6.5)
NEUT%: 55 % (ref 40.0–80.0)
Platelets: 110 10*3/uL — ABNORMAL LOW (ref 145–400)
RBC: 4.59 10*6/uL (ref 4.20–5.70)
RDW: 13.4 % (ref 11.1–15.7)
WBC: 3.8 10*3/uL — ABNORMAL LOW (ref 4.0–10.0)

## 2015-09-05 LAB — CEA: CEA: 3.3 ng/mL (ref 0.0–5.0)

## 2015-09-05 MED ORDER — IOHEXOL 300 MG/ML  SOLN
100.0000 mL | Freq: Once | INTRAMUSCULAR | Status: AC | PRN
  Administered 2015-09-05: 100 mL via INTRAVENOUS

## 2015-09-05 NOTE — Progress Notes (Signed)
Hematology and Oncology Follow Up Visit  Caesare Alban [id number] 11/04/1970 44 y.o. 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  Keith [name] is back for followup. We last saw him back in August. Since then, he is doing quite well. [name] working. He had a good Thanksgiving and Christmas.  He did have scans done. The CT scans did not show any evidence of recurrent disease.  His last CEA back in August was 3.2.  He has occasional right quadrant abdominal discomfort. I'll note this might be from the hepatic steatosis.  He has had no problems with diarrhea. [name] had no rectal bleeding.  [name] had no leg swelling. [name] had no rashes.   Overall, his performance status is ECOG 1  Medications:  Current outpatient prescriptions:  .  lipase/protease/amylase (CREON) 36000 UNITS CPEP capsule, TAKE 1 CAPSULE BY MOUTH THREE TIMES DAILY WITH MEALS, Disp: 90 capsule, Rfl: 6  Allergies: No Known Allergies  Past Medical History, Surgical history, Social history, and Family History were reviewed and updated.  Review of Systems: As above  Physical Exam:  weight is 133 lb (60.328 kg). His oral temperature is 98.2 F (36.8 C). His blood pressure is 127/86 and his pulse is 55. His respiration is 16.   Lungs are clear. Head exam shows no scleral icterus. There is no oral lesion. Oral mucosa is moist. There is no adenopathy in the neck. Cardiac exam regular and rhythm with no murmurs rubs or bruits. Abdomen is soft. Well-healed laparotomy scar in the periumbilical region. He has no fluid wave. There is no guarding or rebound tenderness. There is no palpable liver or spleen tip. Back exam shows no tenderness of the spine, ribs or hips. Extremities shows no clubbing, cyanosis or edema. Skin exam shows no rashes no ecchymosis. Neurological exam is nonfocal.  Lab Results    Component Value Date   WBC 3.8* 09/05/2015   HGB 13.8 09/05/2015   HCT 41.1 09/05/2015   MCV 90 09/05/2015   PLT 110* 09/05/2015     Chemistry      Component Value Date/Time   NA 139 05/08/2015 0853   NA 143 10/23/2014 0927   NA 142 02/26/2014 1555   K 4.7 05/08/2015 0853   K 4.4 10/23/2014 0927   K 4.2 02/26/2014 1555   CL 102 05/08/2015 0853   CL 103 10/23/2014 0927   CO2 24 05/08/2015 0853   CO2 28 10/23/2014 0927   CO2 25 02/26/2014 1555   BUN 11 05/08/2015 0853   BUN 10 10/23/2014 0927   BUN 11.9 02/26/2014 1555   CREATININE 0.88 05/08/2015 0853   CREATININE 0.8 10/23/2014 0927   CREATININE 0.7 02/26/2014 1555      Component Value Date/Time   CALCIUM 8.7 05/08/2015 0853   CALCIUM 9.0 10/23/2014 0927   CALCIUM 8.5 02/26/2014 1555   ALKPHOS 161* 05/08/2015 0853   ALKPHOS 156* 10/23/2014 0927   ALKPHOS 114 02/26/2014 1555   AST 29 05/08/2015 0853   AST 39* 10/23/2014 0927   AST 21 02/26/2014 1555   ALT 21 05/08/2015 0853   ALT 32 10/23/2014 0927   ALT 13 02/26/2014 1555   BILITOT 0.6 05/08/2015 0853   BILITOT 0.70 10/23/2014 0927   BILITOT 0.91 02/26/2014 1555         Impression and Plan: Mr. Dimitriou is 44 year old  gentleman. He has a locally advanced small bowel cancer. He underwent resection. He has 9 positive lymph nodes. As such, he is at risk for recurrence. He underwent aggressive adjuvant therapy with both chemotherapy and radiation therapy. He completed his back in October 2015.  The CT scans are reassuring for right now. We do not see any evidence of recurrent disease. I just do not think we need to do any further scans on him. I think lab work and  changes with his overall physical stature will be able to help [place] determine if there is any recurrence.  However, he is at significant risk for recurrence because of the 9 positive lymph nodes.  For now, we will plan to get him back in about 5 months. Again, I just do not think any additional CT scans  will be beneficial right now. Volanda Napoleon, MD 12/29/20169:42 AM

## 2016-02-04 ENCOUNTER — Other Ambulatory Visit (HOSPITAL_BASED_OUTPATIENT_CLINIC_OR_DEPARTMENT_OTHER): Payer: Commercial Managed Care - PPO

## 2016-02-04 ENCOUNTER — Encounter: Payer: Self-pay | Admitting: Hematology & Oncology

## 2016-02-04 ENCOUNTER — Ambulatory Visit (HOSPITAL_BASED_OUTPATIENT_CLINIC_OR_DEPARTMENT_OTHER): Payer: Commercial Managed Care - PPO | Admitting: Hematology & Oncology

## 2016-02-04 VITALS — BP 118/71 | HR 57 | Temp 97.6°F | Resp 16 | Ht 65.0 in | Wt 124.0 lb

## 2016-02-04 DIAGNOSIS — C179 Malignant neoplasm of small intestine, unspecified: Secondary | ICD-10-CM | POA: Diagnosis not present

## 2016-02-04 DIAGNOSIS — C779 Secondary and unspecified malignant neoplasm of lymph node, unspecified: Secondary | ICD-10-CM

## 2016-02-04 DIAGNOSIS — R634 Abnormal weight loss: Secondary | ICD-10-CM

## 2016-02-04 DIAGNOSIS — C178 Malignant neoplasm of overlapping sites of small intestine: Secondary | ICD-10-CM

## 2016-02-04 LAB — CBC WITH DIFFERENTIAL (CANCER CENTER ONLY)
BASO#: 0 10*3/uL (ref 0.0–0.2)
BASO%: 0.5 % (ref 0.0–2.0)
EOS ABS: 0.3 10*3/uL (ref 0.0–0.5)
EOS%: 6.4 % (ref 0.0–7.0)
HCT: 38.6 % — ABNORMAL LOW (ref 38.7–49.9)
HEMOGLOBIN: 13.3 g/dL (ref 13.0–17.1)
LYMPH#: 1 10*3/uL (ref 0.9–3.3)
LYMPH%: 25.5 % (ref 14.0–48.0)
MCH: 30.9 pg (ref 28.0–33.4)
MCHC: 34.5 g/dL (ref 32.0–35.9)
MCV: 90 fL (ref 82–98)
MONO#: 0.3 10*3/uL (ref 0.1–0.9)
MONO%: 7.9 % (ref 0.0–13.0)
NEUT%: 59.7 % (ref 40.0–80.0)
NEUTROS ABS: 2.3 10*3/uL (ref 1.5–6.5)
Platelets: 104 10*3/uL — ABNORMAL LOW (ref 145–400)
RBC: 4.31 10*6/uL (ref 4.20–5.70)
RDW: 13.5 % (ref 11.1–15.7)
WBC: 3.9 10*3/uL — AB (ref 4.0–10.0)

## 2016-02-04 LAB — COMPREHENSIVE METABOLIC PANEL
ALBUMIN: 3.5 g/dL (ref 3.5–5.0)
ALK PHOS: 132 U/L (ref 40–150)
ALT: 19 U/L (ref 0–55)
AST: 23 U/L (ref 5–34)
Anion Gap: 5 mEq/L (ref 3–11)
BILIRUBIN TOTAL: 0.48 mg/dL (ref 0.20–1.20)
BUN: 15.3 mg/dL (ref 7.0–26.0)
CO2: 23 mEq/L (ref 22–29)
Calcium: 8.3 mg/dL — ABNORMAL LOW (ref 8.4–10.4)
Chloride: 111 mEq/L — ABNORMAL HIGH (ref 98–109)
Creatinine: 0.9 mg/dL (ref 0.7–1.3)
GLUCOSE: 163 mg/dL — AB (ref 70–140)
Potassium: 4.2 mEq/L (ref 3.5–5.1)
SODIUM: 140 meq/L (ref 136–145)
TOTAL PROTEIN: 5.8 g/dL — AB (ref 6.4–8.3)

## 2016-02-04 MED ORDER — OXANDROLONE 10 MG PO TABS: 10.0000 mg | ORAL_TABLET | Freq: Every day | ORAL | Status: DC

## 2016-02-04 NOTE — Progress Notes (Signed)
Hematology and Oncology Follow Up Visit  Keith [name] [id number] 06/29/1971 45 y.o. 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 is back for followup. I'm a little spry last weight loss. He has lost 9 pounds since we last saw him. This is quite troublesome to me. He is on Creon. He does not have diarrhea. His appetite is doing okay.  I will try him on some Oxandrin to see this helps with absorption.  [name] had no abdominal pain. [name] had no bleeding. [name] had no fever.  He is still working. [name] having no problems with work.  [name] had no rashes. [name] had no leg swelling. [name] had no joint problems.  His kids will be going back to [place] for the summer. I think this will work out well for he and his wife. They will then go on vacation to the beach.  Overall, his performance status is ECOG 1.   Medications:  Current outpatient prescriptions:  .  lipase/protease/amylase (CREON) 36000 UNITS CPEP capsule, TAKE 1 CAPSULE BY MOUTH THREE TIMES DAILY WITH MEALS, Disp: 90 capsule, Rfl: 6 .  oxandrolone (OXANDRIN) 10 MG tablet, Take 1 tablet (10 mg total) by mouth daily., Disp: 30 tablet, Rfl: 2  Allergies: No Known Allergies  Past Medical History, Surgical history, Social history, and Family History were reviewed and updated.  Review of Systems: As above  Physical Exam:  height is 5\' 5"  (1.651 m) and weight is 124 lb (56.246 kg). His oral temperature is 97.6 F (36.4 C). His blood pressure is 118/71 and his pulse is 57. His respiration is 16.   Lungs are clear. Head exam shows no scleral icterus. There is no oral lesion. Oral mucosa is moist. There is no adenopathy in the neck. Cardiac exam regular rate and rhythm with no murmurs rubs or bruits. Abdomen is soft. Well-healed laparotomy scar in the periumbilical region. He has no fluid wave. There  is no guarding or rebound tenderness. There is no palpable liver or spleen tip. Back exam shows no tenderness of the spine, ribs or hips. Extremities shows no clubbing, cyanosis or edema. Skin exam shows no rashes no ecchymosis. Neurological exam is nonfocal.  Lab Results  Component Value Date   WBC 3.9* 02/04/2016   HGB 13.3 02/04/2016   HCT 38.6* 02/04/2016   MCV 90 02/04/2016   PLT 104* 02/04/2016     Chemistry      Component Value Date/Time   NA 139 09/05/2015 0853   NA 139 05/08/2015 0853   NA 143 10/23/2014 0927   K 4.6 09/05/2015 0853   K 4.7 05/08/2015 0853   K 4.4 10/23/2014 0927   CL 102 05/08/2015 0853   CL 103 10/23/2014 0927   CO2 28 09/05/2015 0853   CO2 24 05/08/2015 0853   CO2 28 10/23/2014 0927   BUN 12.5 09/05/2015 0853   BUN 11 05/08/2015 0853   BUN 10 10/23/2014 0927   CREATININE 0.9 09/05/2015 0853   CREATININE 0.88 05/08/2015 0853   CREATININE 0.8 10/23/2014 0927      Component Value Date/Time   CALCIUM 8.6 09/05/2015 0853   CALCIUM 8.7 05/08/2015 0853   CALCIUM 9.0 10/23/2014 0927   ALKPHOS 135 09/05/2015 0853   ALKPHOS 161* 05/08/2015 0853   ALKPHOS 156* 10/23/2014 0927   AST 27 09/05/2015 0853  AST 29 05/08/2015 0853   AST 39* 10/23/2014 0927   ALT 28 09/05/2015 0853   ALT 21 05/08/2015 0853   ALT 32 10/23/2014 0927   BILITOT 0.80 09/05/2015 0853   BILITOT 0.6 05/08/2015 0853   BILITOT 0.70 10/23/2014 0927         Impression and Plan: Keith [name] is 45 year old gentleman. He has a locally advanced small bowel cancer. He underwent resection. He has 9 positive lymph nodes. As such, he is at risk for recurrence. He underwent aggressive adjuvant therapy with both chemotherapy and radiation therapy. He completed his back in October 2015.   . I am worried about his weight loss. I am not sure as to why he has this. I do worry about recurrent cancer.  He needs have another so scans done. He has smoked. We have to make sure that a chest CT scan  is also done.  I will like to put him on Oxandrin. I did this be reasonable. I'll put on 10 mg a day.  I will like to see him back in one month.  I spent about 30 minutes with him today.  Volanda Napoleon, MD 5/30/20179:56 AM

## 2016-02-05 ENCOUNTER — Ambulatory Visit (HOSPITAL_BASED_OUTPATIENT_CLINIC_OR_DEPARTMENT_OTHER)
Admission: RE | Admit: 2016-02-05 | Discharge: 2016-02-05 | Disposition: A | Discharge status: Home / Self Care | Payer: Commercial Managed Care - PPO | Source: Ambulatory Visit | Attending: Hematology & Oncology | Admitting: Hematology & Oncology

## 2016-02-05 ENCOUNTER — Encounter (HOSPITAL_BASED_OUTPATIENT_CLINIC_OR_DEPARTMENT_OTHER): Payer: Self-pay

## 2016-02-05 DIAGNOSIS — R634 Abnormal weight loss: Secondary | ICD-10-CM

## 2016-02-05 DIAGNOSIS — Z903 Acquired absence of stomach [part of]: Secondary | ICD-10-CM | POA: Insufficient documentation

## 2016-02-05 DIAGNOSIS — R911 Solitary pulmonary nodule: Secondary | ICD-10-CM | POA: Insufficient documentation

## 2016-02-05 DIAGNOSIS — I708 Atherosclerosis of other arteries: Secondary | ICD-10-CM | POA: Insufficient documentation

## 2016-02-05 DIAGNOSIS — C178 Malignant neoplasm of overlapping sites of small intestine: Secondary | ICD-10-CM | POA: Diagnosis present

## 2016-02-05 DIAGNOSIS — Z9049 Acquired absence of other specified parts of digestive tract: Secondary | ICD-10-CM | POA: Diagnosis not present

## 2016-02-05 LAB — CEA (PARALLEL TESTING): CEA: 3.5 ng/mL — AB

## 2016-02-05 LAB — CEA: CEA: 5.9 ng/mL — ABNORMAL HIGH (ref 0.0–4.7)

## 2016-02-05 MED ORDER — IOPAMIDOL (ISOVUE-300) INJECTION 61%
100.0000 mL | Freq: Once | INTRAVENOUS | Status: AC | PRN
  Administered 2016-02-05: 100 mL via INTRAVENOUS

## 2016-03-05 ENCOUNTER — Ambulatory Visit (HOSPITAL_BASED_OUTPATIENT_CLINIC_OR_DEPARTMENT_OTHER): Payer: Commercial Managed Care - PPO | Admitting: Hematology & Oncology

## 2016-03-05 ENCOUNTER — Encounter: Payer: Self-pay | Admitting: Hematology & Oncology

## 2016-03-05 ENCOUNTER — Other Ambulatory Visit (HOSPITAL_BASED_OUTPATIENT_CLINIC_OR_DEPARTMENT_OTHER): Payer: Commercial Managed Care - PPO

## 2016-03-05 VITALS — BP 108/76 | HR 65 | Temp 97.7°F | Resp 16 | Ht 65.0 in | Wt 127.0 lb

## 2016-03-05 DIAGNOSIS — C178 Malignant neoplasm of overlapping sites of small intestine: Secondary | ICD-10-CM

## 2016-03-05 DIAGNOSIS — R634 Abnormal weight loss: Secondary | ICD-10-CM

## 2016-03-05 DIAGNOSIS — Z85068 Personal history of other malignant neoplasm of small intestine: Secondary | ICD-10-CM

## 2016-03-05 LAB — COMPREHENSIVE METABOLIC PANEL
ALT: 28 U/L (ref 0–55)
AST: 27 U/L (ref 5–34)
Albumin: 3.7 g/dL (ref 3.5–5.0)
Alkaline Phosphatase: 132 U/L (ref 40–150)
Anion Gap: 9 mEq/L (ref 3–11)
BILIRUBIN TOTAL: 0.52 mg/dL (ref 0.20–1.20)
BUN: 13.7 mg/dL (ref 7.0–26.0)
CHLORIDE: 108 meq/L (ref 98–109)
CO2: 23 meq/L (ref 22–29)
CREATININE: 1.1 mg/dL (ref 0.7–1.3)
Calcium: 8.9 mg/dL (ref 8.4–10.4)
EGFR: 84 mL/min/{1.73_m2} — ABNORMAL LOW (ref >90)
GLUCOSE: 212 mg/dL — AB (ref 70–140)
Potassium: 4.3 mEq/L (ref 3.5–5.1)
SODIUM: 141 meq/L (ref 136–145)
TOTAL PROTEIN: 6.6 g/dL (ref 6.4–8.3)

## 2016-03-05 LAB — CBC WITH DIFFERENTIAL (CANCER CENTER ONLY)
BASO#: 0 10*3/uL (ref 0.0–0.2)
BASO%: 0.3 % (ref 0.0–2.0)
EOS%: 6.7 % (ref 0.0–7.0)
Eosinophils Absolute: 0.2 10*3/uL (ref 0.0–0.5)
HCT: 42.1 % (ref 38.7–49.9)
HGB: 14.4 g/dL (ref 13.0–17.1)
LYMPH#: 1.1 10*3/uL (ref 0.9–3.3)
LYMPH%: 29.3 % (ref 14.0–48.0)
MCH: 30.7 pg (ref 28.0–33.4)
MCHC: 34.2 g/dL (ref 32.0–35.9)
MCV: 90 fL (ref 82–98)
MONO#: 0.4 10*3/uL (ref 0.1–0.9)
MONO%: 10.3 % (ref 0.0–13.0)
NEUT#: 1.9 10*3/uL (ref 1.5–6.5)
NEUT%: 53.4 % (ref 40.0–80.0)
Platelets: 128 10*3/uL — ABNORMAL LOW (ref 145–400)
RBC: 4.69 10*6/uL (ref 4.20–5.70)
RDW: 14 % (ref 11.1–15.7)
WBC: 3.6 10*3/uL — AB (ref 4.0–10.0)

## 2016-03-05 LAB — LACTATE DEHYDROGENASE: LDH: 177 U/L (ref 125–245)

## 2016-03-05 LAB — TSH: TSH: 1.197 m[IU]/L (ref 0.320–4.118)

## 2016-03-05 NOTE — Progress Notes (Signed)
Hematology and Oncology Follow Up Visit  Keith [name] [id number] [birth year] 45 y.o. 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 He is feeling better. His weight is going up a little bit. I have him on Oxandrin. He is still taking the Creon.  We did go ahead and do a CT scan of May sure there is no evidence of recurrent disease. The CT scan was done on May 31. There is no evidence of recurrent disease.  We repeated his CEA. This was stable at 3.5.  He has had no nausea or vomiting. He has had no change in bowel or bladder habits.  He is still working. His children are visiting family in [place]. Hopefully he and his wife will be able to enjoy some time together.  Overall, his performance status is ECOG 1.   Medications:  Current outpatient prescriptions:  .  lipase/protease/amylase (CREON) 36000 UNITS CPEP capsule, TAKE 1 CAPSULE BY MOUTH THREE TIMES DAILY WITH MEALS, Disp: 90 capsule, Rfl: 6 .  oxandrolone (OXANDRIN) 10 MG tablet, Take 1 tablet (10 mg total) by mouth daily., Disp: 30 tablet, Rfl: 2  Allergies: No Known Allergies  Past Medical History, Surgical history, Social history, and Family History were reviewed and updated.  Review of Systems: As above  Physical Exam:  height is 5\' 5"  (1.651 m) and weight is 127 lb (57.607 kg). His oral temperature is 97.7 F (36.5 C). His blood pressure is 108/76 and his pulse is 65. His respiration is 16.   Lungs are clear. Head exam shows no scleral icterus. There is no oral lesion. Oral mucosa is moist. There is no adenopathy in the neck. Cardiac exam regular rate and rhythm with no murmurs rubs or bruits. Abdomen is soft. Well-healed laparotomy scar in the periumbilical region. He has no fluid wave. There is no guarding or rebound tenderness. There is no palpable liver or  spleen tip. Back exam shows no tenderness of the spine, ribs or hips. Extremities shows no clubbing, cyanosis or edema. Skin exam shows no rashes no ecchymosis. Neurological exam is nonfocal.  Lab Results  Component Value Date   WBC 3.6* 03/05/2016   HGB 14.4 03/05/2016   HCT 42.1 03/05/2016   MCV 90 03/05/2016   PLT 128* 03/05/2016     Chemistry      Component Value Date/Time   NA 140 02/04/2016 0911   NA 139 05/08/2015 0853   NA 143 10/23/2014 0927   K 4.2 02/04/2016 0911   K 4.7 05/08/2015 0853   K 4.4 10/23/2014 0927   CL 102 05/08/2015 0853   CL 103 10/23/2014 0927   CO2 23 02/04/2016 0911   CO2 24 05/08/2015 0853   CO2 28 10/23/2014 0927   BUN 15.3 02/04/2016 0911   BUN 11 05/08/2015 0853   BUN 10 10/23/2014 0927   CREATININE 0.9 02/04/2016 0911   CREATININE 0.88 05/08/2015 0853   CREATININE 0.8 10/23/2014 0927      Component Value Date/Time   CALCIUM 8.3* 02/04/2016 0911   CALCIUM 8.7 05/08/2015 0853   CALCIUM 9.0 10/23/2014 0927   ALKPHOS 132 02/04/2016 0911   ALKPHOS 161* 05/08/2015 0853   ALKPHOS 156* 10/23/2014 0927   AST 23 02/04/2016 0911   AST 29 05/08/2015 0853   AST 39* 10/23/2014 [id number]  ALT 19 02/04/2016 0911   ALT 21 05/08/2015 0853   ALT 32 10/23/2014 0927   BILITOT 0.48 02/04/2016 0911   BILITOT 0.6 05/08/2015 0853   BILITOT 0.70 10/23/2014 0927         Impression and Plan: Keith [name] is 45 year old gentleman. He has a locally advanced small bowel cancer. He underwent resection. He has 9 positive lymph nodes. As such, he is at risk for recurrence. He underwent aggressive adjuvant therapy with both chemotherapy and radiation therapy. He completed his back in October 2015.   .Thankfully, there is no evidence of recurrent disease. The CT scan is quite reassuring.  We do not have to do another scan on him for another 6 months.  His weight is up a little bit. He will continue the Creon and the Oxandrin.  I will see him back in 3 months for  follow-up. Hopefully, his weight will be over 135 pounds.  I spent  about 30 minutes with him today.  Volanda Napoleon, MD 6/29/20178:36 AM

## 2016-03-06 LAB — CEA (PARALLEL TESTING): CEA: 3.8 ng/mL — AB

## 2016-03-06 LAB — T4, FREE: FREE T4: 1.17 ng/dL (ref 0.82–1.77)

## 2016-03-06 LAB — CEA: CEA: 5.8 ng/mL — ABNORMAL HIGH (ref 0.0–4.7)

## 2016-05-22 ENCOUNTER — Other Ambulatory Visit: Payer: Self-pay | Admitting: Hematology & Oncology

## 2016-06-05 ENCOUNTER — Other Ambulatory Visit (HOSPITAL_BASED_OUTPATIENT_CLINIC_OR_DEPARTMENT_OTHER): Payer: Commercial Managed Care - PPO

## 2016-06-05 ENCOUNTER — Ambulatory Visit (HOSPITAL_BASED_OUTPATIENT_CLINIC_OR_DEPARTMENT_OTHER): Payer: Commercial Managed Care - PPO | Admitting: Hematology & Oncology

## 2016-06-05 ENCOUNTER — Encounter: Payer: Self-pay | Admitting: Hematology & Oncology

## 2016-06-05 VITALS — BP 105/71 | HR 60 | Temp 97.4°F | Resp 18 | Ht 65.0 in | Wt 128.0 lb

## 2016-06-05 DIAGNOSIS — C178 Malignant neoplasm of overlapping sites of small intestine: Secondary | ICD-10-CM

## 2016-06-05 DIAGNOSIS — Z85068 Personal history of other malignant neoplasm of small intestine: Secondary | ICD-10-CM | POA: Diagnosis not present

## 2016-06-05 LAB — CBC WITH DIFFERENTIAL (CANCER CENTER ONLY)
BASO#: 0 10*3/uL (ref 0.0–0.2)
BASO%: 0.4 % (ref 0.0–2.0)
EOS%: 5.1 % (ref 0.0–7.0)
Eosinophils Absolute: 0.3 10*3/uL (ref 0.0–0.5)
HEMATOCRIT: 42.4 % (ref 38.7–49.9)
HEMOGLOBIN: 14.5 g/dL (ref 13.0–17.1)
LYMPH#: 1.4 10*3/uL (ref 0.9–3.3)
LYMPH%: 25.1 % (ref 14.0–48.0)
MCH: 30.5 pg (ref 28.0–33.4)
MCHC: 34.2 g/dL (ref 32.0–35.9)
MCV: 89 fL (ref 82–98)
MONO#: 0.4 10*3/uL (ref 0.1–0.9)
MONO%: 7.5 % (ref 0.0–13.0)
NEUT%: 61.9 % (ref 40.0–80.0)
NEUTROS ABS: 3.4 10*3/uL (ref 1.5–6.5)
PLATELETS: 125 10*3/uL — AB (ref 145–400)
RBC: 4.75 10*6/uL (ref 4.20–5.70)
RDW: 13.6 % (ref 11.1–15.7)
WBC: 5.5 10*3/uL (ref 4.0–10.0)

## 2016-06-05 LAB — COMPREHENSIVE METABOLIC PANEL
ALBUMIN: 3.7 g/dL (ref 3.5–5.0)
ALT: 24 U/L (ref 0–55)
AST: 26 U/L (ref 5–34)
Alkaline Phosphatase: 141 U/L (ref 40–150)
Anion Gap: 9 mEq/L (ref 3–11)
BUN: 16.4 mg/dL (ref 7.0–26.0)
CALCIUM: 8.7 mg/dL (ref 8.4–10.4)
CHLORIDE: 110 meq/L — AB (ref 98–109)
CO2: 25 mEq/L (ref 22–29)
CREATININE: 0.9 mg/dL (ref 0.7–1.3)
EGFR: >90 mL/min/{1.73_m2} (ref >90)
Glucose: 143 mg/dl — ABNORMAL HIGH (ref 70–140)
Potassium: 4.4 mEq/L (ref 3.5–5.1)
Sodium: 143 mEq/L (ref 136–145)
Total Bilirubin: 0.61 mg/dL (ref 0.20–1.20)
Total Protein: 6.4 g/dL (ref 6.4–8.3)

## 2016-06-05 LAB — CEA (IN HOUSE-CHCC): CEA (CHCC-In House): 7 ng/mL — ABNORMAL HIGH (ref 0.00–5.00)

## 2016-06-05 NOTE — Progress Notes (Signed)
Hematology and Oncology Follow Up Visit  Keith [name] [id number] 22-Mar-1971 45 y.o. 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 is back for followup. He is feeling better. His weight is going up a little bit. He is eating better. He is working. He is trying to exercise.  His last CEA was stable at 3.8.  He has had no abdominal pain. He has had no problems with bowels or bladder. There has been no cough. [name] had no rashes. [name] had no fever. [name] had no headache. [name] had no bleeding or bruising.   His wife and children were in [place] over the summertime. As such, he was on his own. He seems to enjoy this.   He is taking Creon every day. I'm not sure how much of the Oxandrin he is taking. I told him if he wanted to stop this, he could.   Overall, his performance status is ECOG 1.   Medications:  Current Outpatient Prescriptions:  .  CREON 36000 units CPEP capsule, TAKE 1 CAPSULE BY MOUTH THREE TIMES DAILY WITH MEALS, Disp: 90 capsule, Rfl: 0 .  oxandrolone (OXANDRIN) 10 MG tablet, Take 1 tablet (10 mg total) by mouth daily., Disp: 30 tablet, Rfl: 2  Allergies: No Known Allergies  Past Medical History, Surgical history, Social history, and Family History were reviewed and updated.  Review of Systems: As above  Physical Exam:  height is 5\' 5"  (1.651 m) and weight is 128 lb (58.1 kg). His oral temperature is 97.4 F (36.3 C). His blood pressure is 105/71 and his pulse is 60. His respiration is 18.   Lungs are clear. Head exam shows no scleral icterus. There is no oral lesion. Oral mucosa is moist. There is no adenopathy in the neck. Cardiac exam regular rate and rhythm with no murmurs rubs or bruits. Abdomen is soft. Well-healed laparotomy scar in the periumbilical region. He has no fluid wave. There is no  guarding or rebound tenderness. There is no palpable liver or spleen tip. Back exam shows no tenderness of the spine, ribs or hips. Extremities shows no clubbing, cyanosis or edema. Skin exam shows no rashes no ecchymosis. Neurological exam is nonfocal.  Lab Results  Component Value Date   WBC 5.5 06/05/2016   HGB 14.5 06/05/2016   HCT 42.4 06/05/2016   MCV 89 06/05/2016   PLT 125 (L) 06/05/2016     Chemistry      Component Value Date/Time   NA 141 03/05/2016 0801   K 4.3 03/05/2016 0801   CL 102 05/08/2015 0853   CL 103 10/23/2014 0927   CO2 23 03/05/2016 0801   BUN 13.7 03/05/2016 0801   CREATININE 1.1 03/05/2016 0801      Component Value Date/Time   CALCIUM 8.9 03/05/2016 0801   ALKPHOS 132 03/05/2016 0801   AST 27 03/05/2016 0801   ALT 28 03/05/2016 0801   BILITOT 0.52 03/05/2016 0801         Impression and Plan: Keith [name] is 45 year old gentleman. He has a locally advanced small bowel cancer. He underwent resection. He has 9 positive lymph nodes. As such, he is at risk for recurrence. He underwent aggressive adjuvant therapy with both chemotherapy and radiation therapy. He completed his back in October 2015.   He looks quite good.  I'm happy for him. His weight is holding steady.  I think we are probably get him back now in 4 months. I do not see a need for any scans unless he has any, symptoms or if his lab work is abnormal.  He will continue to take the Creon.   Keith Napoleon, MD 9/29/20179:08 AM

## 2016-06-06 LAB — CEA: CEA1: 6.9 ng/mL — AB (ref 0.0–4.7)

## 2016-06-24 ENCOUNTER — Other Ambulatory Visit: Payer: Self-pay | Admitting: Hematology & Oncology

## 2016-07-26 ENCOUNTER — Other Ambulatory Visit: Payer: Self-pay | Admitting: Hematology & Oncology

## 2016-08-27 ENCOUNTER — Other Ambulatory Visit: Payer: Self-pay | Admitting: Hematology & Oncology

## 2016-10-09 ENCOUNTER — Other Ambulatory Visit (HOSPITAL_BASED_OUTPATIENT_CLINIC_OR_DEPARTMENT_OTHER): Payer: Commercial Managed Care - PPO

## 2016-10-09 ENCOUNTER — Ambulatory Visit (HOSPITAL_BASED_OUTPATIENT_CLINIC_OR_DEPARTMENT_OTHER): Payer: Commercial Managed Care - PPO | Admitting: Family

## 2016-10-09 VITALS — BP 112/76 | HR 53 | Temp 98.0°F | Wt 128.8 lb

## 2016-10-09 DIAGNOSIS — Z85068 Personal history of other malignant neoplasm of small intestine: Secondary | ICD-10-CM

## 2016-10-09 DIAGNOSIS — C178 Malignant neoplasm of overlapping sites of small intestine: Secondary | ICD-10-CM

## 2016-10-09 LAB — CBC WITH DIFFERENTIAL (CANCER CENTER ONLY)
BASO#: 0 10*3/uL (ref 0.0–0.2)
BASO%: 0.4 % (ref 0.0–2.0)
EOS%: 4.5 % (ref 0.0–7.0)
Eosinophils Absolute: 0.2 10*3/uL (ref 0.0–0.5)
HCT: 43.1 % (ref 38.7–49.9)
HGB: 14.8 g/dL (ref 13.0–17.1)
LYMPH#: 1.4 10*3/uL (ref 0.9–3.3)
LYMPH%: 29 % (ref 14.0–48.0)
MCH: 30.8 pg (ref 28.0–33.4)
MCHC: 34.3 g/dL (ref 32.0–35.9)
MCV: 90 fL (ref 82–98)
MONO#: 0.4 10*3/uL (ref 0.1–0.9)
MONO%: 7.6 % (ref 0.0–13.0)
NEUT#: 2.9 10*3/uL (ref 1.5–6.5)
NEUT%: 58.5 % (ref 40.0–80.0)
Platelets: 128 10*3/uL — ABNORMAL LOW (ref 145–400)
RBC: 4.81 10*6/uL (ref 4.20–5.70)
RDW: 13.2 % (ref 11.1–15.7)
WBC: 4.9 10*3/uL (ref 4.0–10.0)

## 2016-10-09 LAB — COMPREHENSIVE METABOLIC PANEL
ALBUMIN: 4 g/dL (ref 3.5–5.0)
ALK PHOS: 135 U/L (ref 40–150)
ALT: 27 U/L (ref 0–55)
AST: 27 U/L (ref 5–34)
Anion Gap: 7 mEq/L (ref 3–11)
BUN: 13.9 mg/dL (ref 7.0–26.0)
CALCIUM: 9 mg/dL (ref 8.4–10.4)
CO2: 26 mEq/L (ref 22–29)
Chloride: 108 mEq/L (ref 98–109)
Creatinine: 0.9 mg/dL (ref 0.7–1.3)
EGFR: >90 mL/min/{1.73_m2} (ref >90)
Glucose: 97 mg/dl (ref 70–140)
POTASSIUM: 4.1 meq/L (ref 3.5–5.1)
Sodium: 142 mEq/L (ref 136–145)
Total Bilirubin: 0.69 mg/dL (ref 0.20–1.20)
Total Protein: 6.7 g/dL (ref 6.4–8.3)

## 2016-10-09 LAB — CEA (IN HOUSE-CHCC): CEA (CHCC-In House): 5.83 ng/mL — ABNORMAL HIGH (ref 0.00–5.00)

## 2016-10-09 NOTE — Progress Notes (Signed)
Hematology and Oncology Follow Up Visit  Bacilio Rhyner [id number] [birth year] 46 y.o. 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 Piccoli is here today for a follow-up. He continues to do well and has no complaints at this time. His CEA in September was up slightly at 6.9. Today's results are pending.  He is staying busy with work and enjoying his family.  He has maintained a good appetite and is staying hydrated. His weight is stable at 128.  He has had no problem with infections. He denies fever, chills, n/v, cough, rash, dizziness, SOB, chest pain, palpitations, abdominal pain, constipation, diarrhea, blood in urine or stool.  He has had no swelling, tenderness, numbness or tingling in his extremities. No c/o pain at this time.   Medications:  Allergies as of 10/09/2016   No Known Allergies     Medication List       Accurate as of 10/09/16  8:18 AM. Always use your most recent med list.          CREON 36000 UNITS Cpep capsule Generic drug:  lipase/protease/amylase TAKE 1 CAPSULE BY MOUTH THREE TIMES DAILY WITH MEALS   CREON 36000 UNITS Cpep capsule Generic drug:  lipase/protease/amylase TAKE 1 CAPSULE BY MOUTH THREE TIMES DAILY WITH MEALS   CREON 36000 UNITS Cpep capsule Generic drug:  lipase/protease/amylase TAKE 1 CAPSULE BY MOUTH THREE TIMES DAILY WITH MEALS   CREON 36000 UNITS Cpep capsule Generic drug:  lipase/protease/amylase TAKE 1 CAPSULE BY MOUTH THREE TIMES DAILY WITH MEALS   oxandrolone 10 MG tablet Commonly known as:  OXANDRIN Take 1 tablet (10 mg total) by mouth daily.       Allergies: No Known Allergies  Past Medical History, Surgical history, Social history, and Family History were reviewed and updated.  Review of Systems: All other 10 point review of systems is negative.   Physical  Exam:  vitals were not taken for this visit.  Wt Readings from Last 3 Encounters:  06/05/16 128 lb (58.1 kg)  03/05/16 127 lb (57.6 kg)  02/04/16 124 lb (56.2 kg)    Ocular: Sclerae unicteric, pupils equal, round and reactive to light Ear-nose-throat: Oropharynx clear, dentition fair Lymphatic: No cervical supraclavicular or axillary adenopathy Lungs no rales or rhonchi, good excursion bilaterally Heart regular rate and rhythm, no murmur appreciated Abd soft, nontender, positive bowel sounds, no liver or spleen tip palpated on exam, no fluid wave MSK no focal spinal tenderness, no joint edema Neuro: non-focal, well-oriented, appropriate affect  Lab Results  Component Value Date   WBC 4.9 10/09/2016   HGB 14.8 10/09/2016   HCT 43.1 10/09/2016   MCV 90 10/09/2016   PLT 128 (L) 10/09/2016   No results found for: FERRITIN, IRON, TIBC, UIBC, IRONPCTSAT Lab Results  Component Value Date   RBC 4.81 10/09/2016   No results found for: KPAFRELGTCHN, LAMBDASER, KAPLAMBRATIO No results found for: IGGSERUM, IGA, IGMSERUM No results found for: Odetta Pink, SPEI   Chemistry      Component Value Date/Time   NA 143 06/05/2016 0811   K 4.4 06/05/2016 0811   CL 102 05/08/2015 0853   CL 103 10/23/2014 0927   CO2 25 06/05/2016 0811   BUN 16.4 06/05/2016 0811   CREATININE 0.9 06/05/2016 0811      Component  Value Date/Time   CALCIUM 8.7 06/05/2016 0811   ALKPHOS 141 06/05/2016 0811   AST 26 06/05/2016 0811   ALT 24 06/05/2016 0811   BILITOT 0.61 06/05/2016 0811     Impression and Plan: Mr. Vinson is 46 year old gentleman with history of locally advanced small bowel cancer. He underwent resection and has 9 positive lymph nodes. He completed chemotherapy in October 2015. He continues to do well and is asymptomatic at this time.  We will continue to follow along with him. CEA results are pending at this time.  We will hold off on  repeating scans for now unless he becomes symptomatic.  We will plan to see him back in 4 months for lab work and follow-up.   He will contact our office with any questions or concerns. We can certainly see him sooner if need be.   Eliezer Bottom, NP 2/2/20188:18 AM

## 2016-10-10 LAB — CEA: CEA: 5.7 ng/mL — ABNORMAL HIGH (ref 0.0–4.7)

## 2017-02-04 ENCOUNTER — Other Ambulatory Visit (HOSPITAL_BASED_OUTPATIENT_CLINIC_OR_DEPARTMENT_OTHER): Payer: Commercial Managed Care - PPO

## 2017-02-04 ENCOUNTER — Ambulatory Visit (HOSPITAL_BASED_OUTPATIENT_CLINIC_OR_DEPARTMENT_OTHER): Payer: Commercial Managed Care - PPO | Admitting: Hematology & Oncology

## 2017-02-04 VITALS — BP 110/72 | HR 62 | Temp 98.8°F | Resp 16 | Wt 127.0 lb

## 2017-02-04 DIAGNOSIS — C178 Malignant neoplasm of overlapping sites of small intestine: Secondary | ICD-10-CM | POA: Diagnosis not present

## 2017-02-04 DIAGNOSIS — Z85068 Personal history of other malignant neoplasm of small intestine: Secondary | ICD-10-CM | POA: Diagnosis not present

## 2017-02-04 LAB — CBC WITH DIFFERENTIAL (CANCER CENTER ONLY)
BASO#: 0 10*3/uL (ref 0.0–0.2)
BASO%: 0.2 % (ref 0.0–2.0)
EOS%: 6.5 % (ref 0.0–7.0)
Eosinophils Absolute: 0.3 10*3/uL (ref 0.0–0.5)
HCT: 40.8 % (ref 38.7–49.9)
HGB: 13.9 g/dL (ref 13.0–17.1)
LYMPH#: 1 10*3/uL (ref 0.9–3.3)
LYMPH%: 20.9 % (ref 14.0–48.0)
MCH: 31 pg (ref 28.0–33.4)
MCHC: 34.1 g/dL (ref 32.0–35.9)
MCV: 91 fL (ref 82–98)
MONO#: 0.4 10*3/uL (ref 0.1–0.9)
MONO%: 8.6 % (ref 0.0–13.0)
NEUT#: 3 10*3/uL (ref 1.5–6.5)
NEUT%: 63.8 % (ref 40.0–80.0)
PLATELETS: 112 10*3/uL — AB (ref 145–400)
RBC: 4.48 10*6/uL (ref 4.20–5.70)
RDW: 13.2 % (ref 11.1–15.7)
WBC: 4.7 10*3/uL (ref 4.0–10.0)

## 2017-02-04 LAB — CEA (IN HOUSE-CHCC): CEA (CHCC-In House): 5.76 ng/mL — ABNORMAL HIGH (ref 0.00–5.00)

## 2017-02-04 LAB — COMPREHENSIVE METABOLIC PANEL
ALT: 20 U/L (ref 0–55)
ANION GAP: 7 meq/L (ref 3–11)
AST: 24 U/L (ref 5–34)
Albumin: 3.8 g/dL (ref 3.5–5.0)
Alkaline Phosphatase: 113 U/L (ref 40–150)
BILIRUBIN TOTAL: 0.91 mg/dL (ref 0.20–1.20)
BUN: 14.2 mg/dL (ref 7.0–26.0)
CHLORIDE: 111 meq/L — AB (ref 98–109)
CO2: 25 meq/L (ref 22–29)
Calcium: 8.9 mg/dL (ref 8.4–10.4)
Creatinine: 0.9 mg/dL (ref 0.7–1.3)
GLUCOSE: 137 mg/dL (ref 70–140)
Potassium: 4.6 mEq/L (ref 3.5–5.1)
SODIUM: 144 meq/L (ref 136–145)
TOTAL PROTEIN: 6.1 g/dL — AB (ref 6.4–8.3)

## 2017-02-04 NOTE — Progress Notes (Signed)
Hematology and Oncology Follow Up Visit  Keith [name] [id number] Aug 16, 1971 46 y.o. 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  Keith [name] is back for followup. He had to comes in with his wife. I'm not seeing her for quite a while.   He is feeling better. His weight is holding steady.Keith [name] He is eating better. He is working. He is trying to exercise. He runs every week.  His last CEA was stable at 5.7  He has had no abdominal pain. He has had no problems with bowels or bladder. There has been no cough. [name] had no rashes. [name] had no fever. [name] had no headache. [name] had no bleeding or bruising.   So far, there are no plans for any summer vacation. His children and wife will not be going to [place] this summer. They went last summer.  He is taking Creon every day. He is not having any diarrhea.  [name] had no rashes. [name] had no problem with infections over the winter or spring.  He did have a nice Memorial Day weekend. He was busy putting down ceramic tile in their bathroom.  Overall, his performance status is ECOG 1.   Medications:  Current Outpatient Prescriptions:  .  CREON 36000 units CPEP capsule, TAKE 1 CAPSULE BY MOUTH THREE TIMES DAILY WITH MEALS, Disp: 90 capsule, Rfl: 0  Allergies: No Known Allergies  Past Medical History, Surgical history, Social history, and Family History were reviewed and updated.  Review of Systems: As above  Physical Exam:  weight is 127 lb (57.6 kg). His oral temperature is 98.8 F (37.1 C). His blood pressure is 110/72 and his pulse is 62. His respiration is 16 and oxygen saturation is 100%.   Lungs are clear. Head exam shows no scleral icterus. There is no oral lesion. Oral mucosa is moist. There is no adenopathy in the neck. Cardiac exam regular rate and rhythm with no murmurs rubs  or bruits. Abdomen is soft. Well-healed laparotomy scar in the periumbilical region. He has no fluid wave. There is no guarding or rebound tenderness. There is no palpable liver or spleen tip. Back exam shows no tenderness of the spine, ribs or hips. Extremities shows no clubbing, cyanosis or edema. Skin exam shows no rashes no ecchymosis. Neurological exam is nonfocal.  Lab Results  Component Value Date   WBC 4.7 02/04/2017   HGB 13.9 02/04/2017   HCT 40.8 02/04/2017   MCV 91 02/04/2017   PLT 112 (L) 02/04/2017     Chemistry      Component Value Date/Time   NA 142 10/09/2016 0804   K 4.1 10/09/2016 0804   CL 102 05/08/2015 0853   CL 103 10/23/2014 0927   CO2 26 10/09/2016 0804   BUN 13.9 10/09/2016 0804   CREATININE 0.9 10/09/2016 0804      Component Value Date/Time   CALCIUM 9.0 10/09/2016 0804   ALKPHOS 135 10/09/2016 0804   AST 27 10/09/2016 0804   ALT 27 10/09/2016 0804   BILITOT 0.69 10/09/2016 0804         Impression and Plan: [name] is 46 year old gentleman. He has a locally advanced small bowel cancer. He underwent resection. He has 9 positive lymph nodes. As such, he is at risk for recurrence. He underwent aggressive adjuvant therapy with both  chemotherapy and radiation therapy. He completed his back in October 2015.   He looks quite good. I'm happy for him. His weight is holding steady.  His platelet count is down a little bit. We will just have to watch this. I don't see that we have make any changes. I think this probably is from his treatments.  We will get him back in 6 more months. I think this would be reasonable. I do not see a need for any scans.  Volanda Napoleon, MD 5/31/20188:31 AM

## 2017-03-05 ENCOUNTER — Other Ambulatory Visit: Payer: Self-pay | Admitting: Hematology & Oncology

## 2017-04-19 ENCOUNTER — Other Ambulatory Visit: Payer: Self-pay | Admitting: Hematology & Oncology

## 2017-05-10 IMAGING — CT CT CHEST W/ CM
2 of 5 series · 12 of 36 positions shown, 15 images · IV contrast (APPLIED)
Comparison: Multiple priors, most recently a CT of the chest,
abdomen and pelvis 09/05/2015.

CLINICAL DATA: 44-year-old male with history of small bowel
(duodenal) carcinoma. History of hepatitis-B. Status post partial
gastrectomy in pancreaticoduodenectomy. Followup study.

EXAM:
CT CHEST, ABDOMEN, AND PELVIS WITH CONTRAST
TECHNIQUE: Multidetector CT imaging of the chest, abdomen and pelvis was
performed following the standard protocol during bolus
administration of intravenous contrast.
CONTRAST:  100mL [id number] IOPAMIDOL ([id number]) INJECTION 61%

[Series 2: cap with 2 · axial · 0.82mm/px · z∈[-641,-81]mm · 9 of 140 slices shown, 12 images]
[im 14/140  mediastinal]
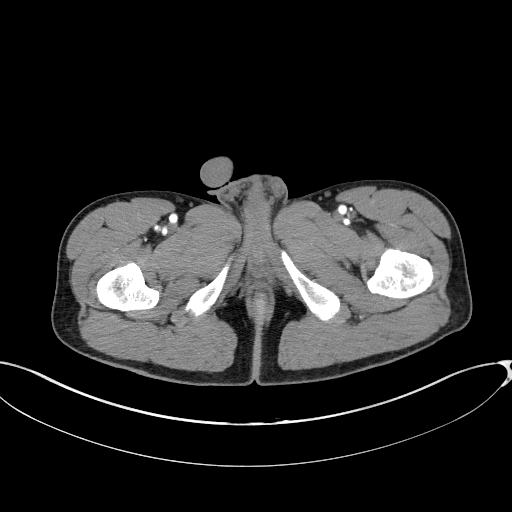
[im 14/140  lung]
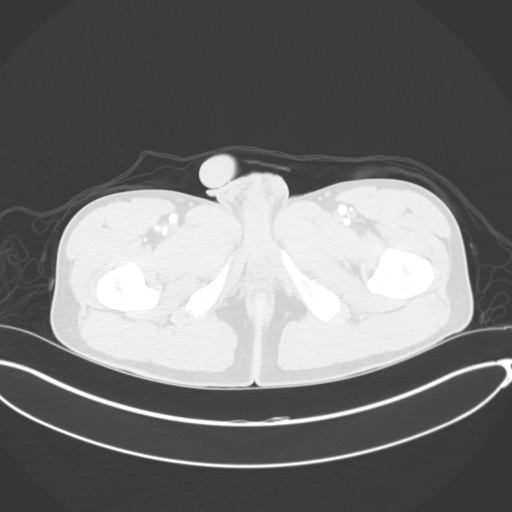
[im 28/140  lung]
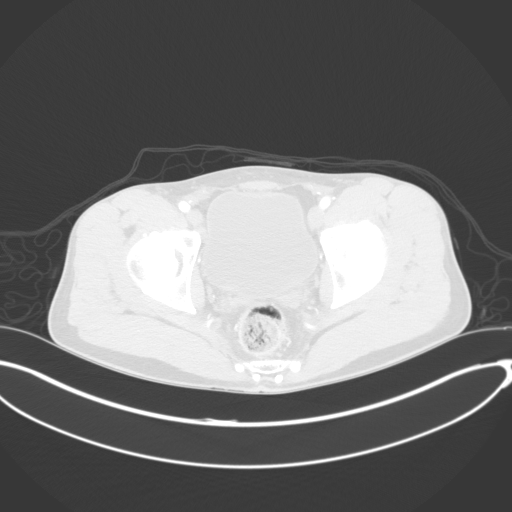
[im 42/140  lung]
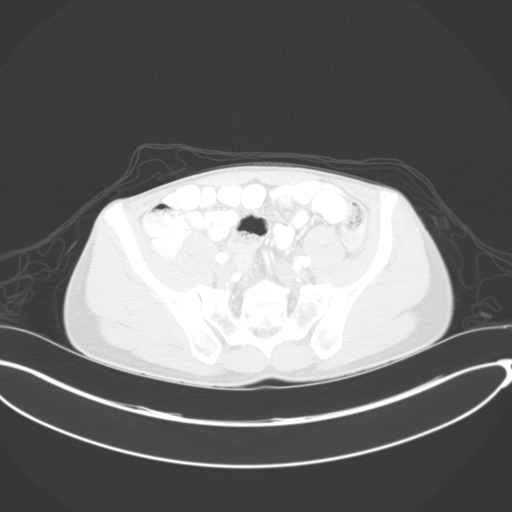
[im 56/140  lung]
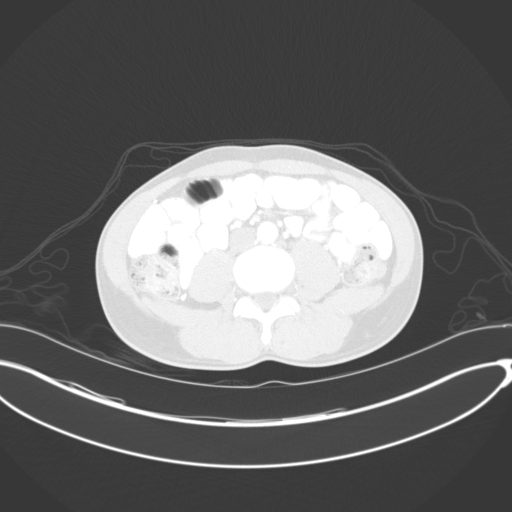
[im 70/140  mediastinal]
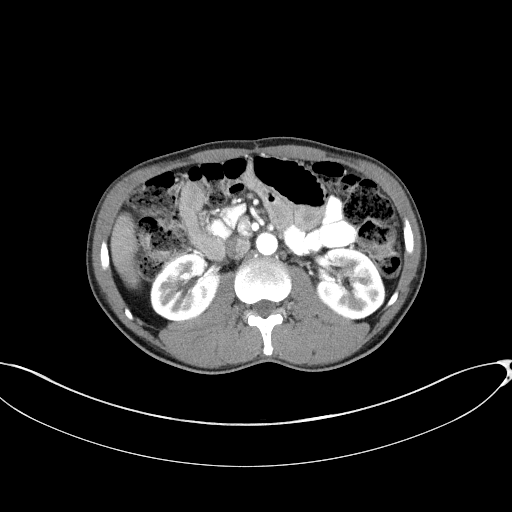
[im 70/140  lung]
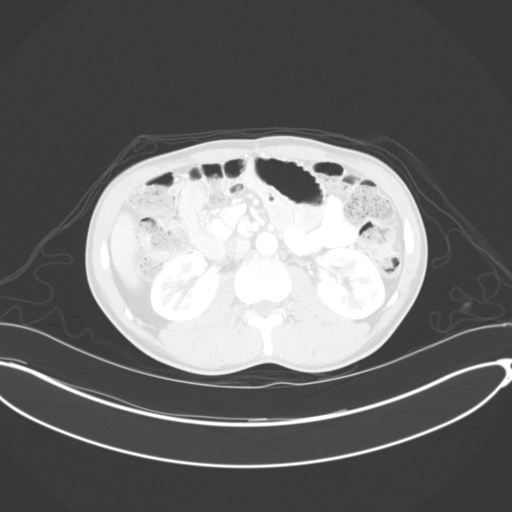
[im 84/140  lung]
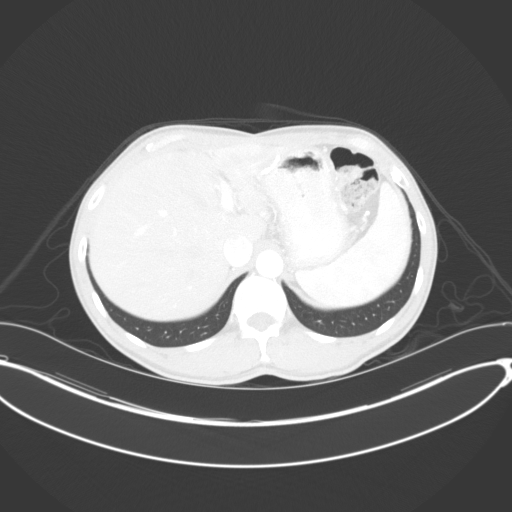
[im 98/140  lung]
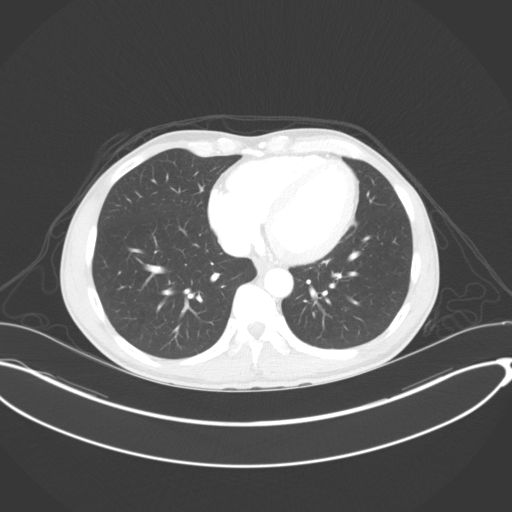
[im 112/140  lung]
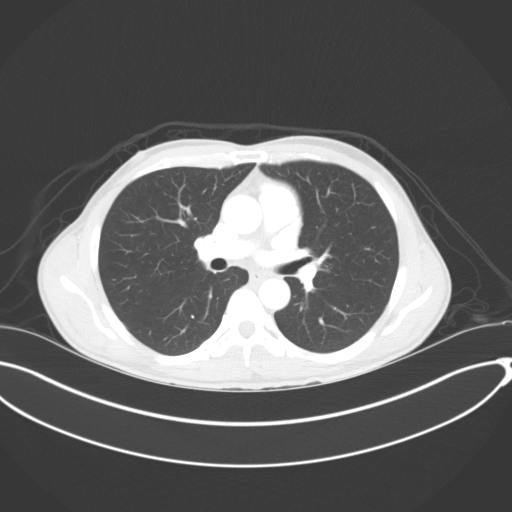
[im 126/140  mediastinal]
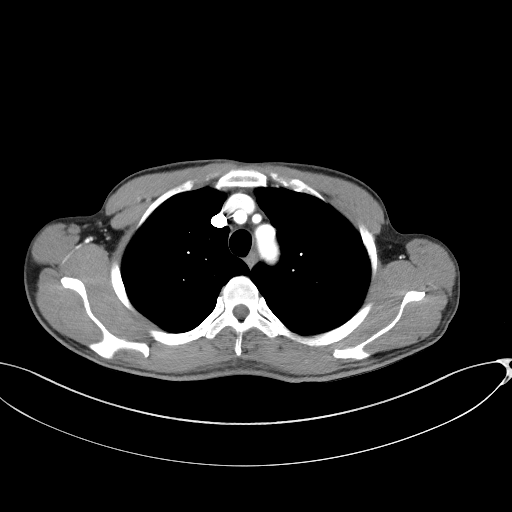
[im 126/140  lung]
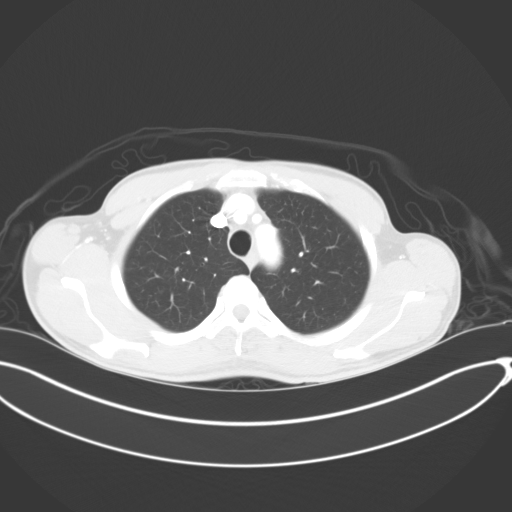

[Series 5: coronals · coronal · 0.79mm/px · 3 of 104 slices shown]
[im 21/104  lung]
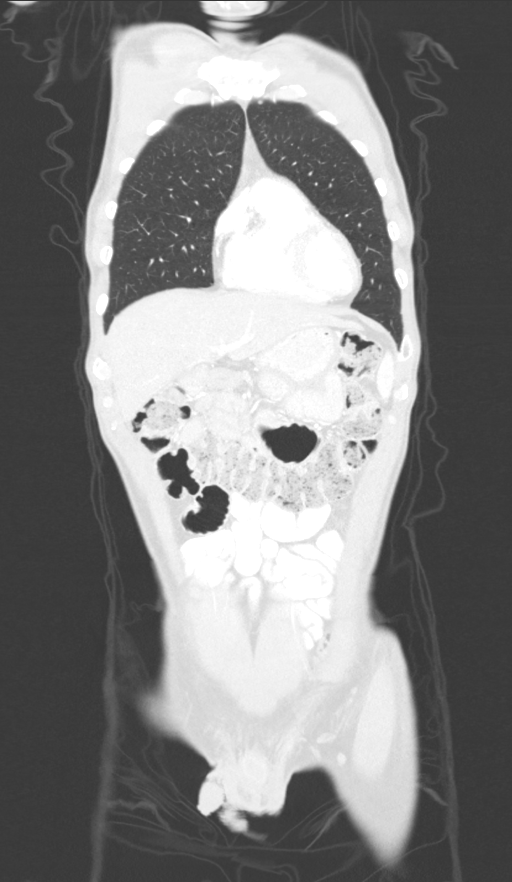
[im 42/104  lung]
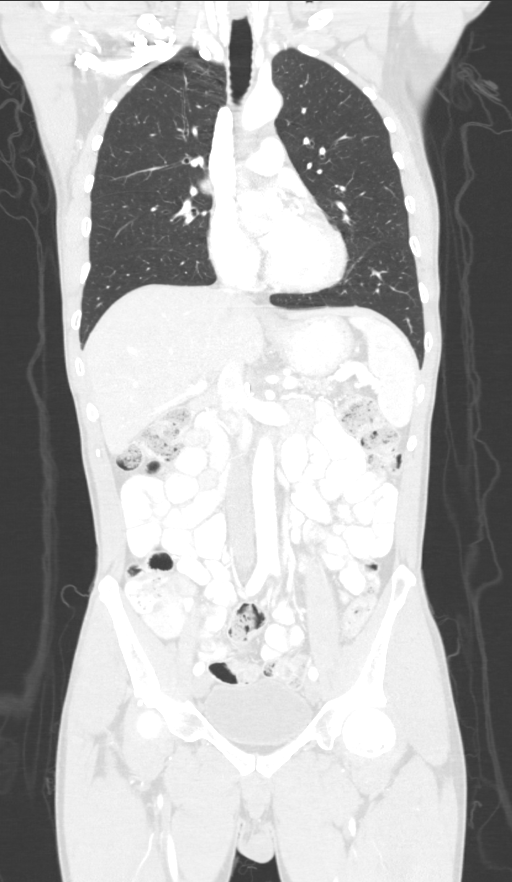
[im 62/104  lung]
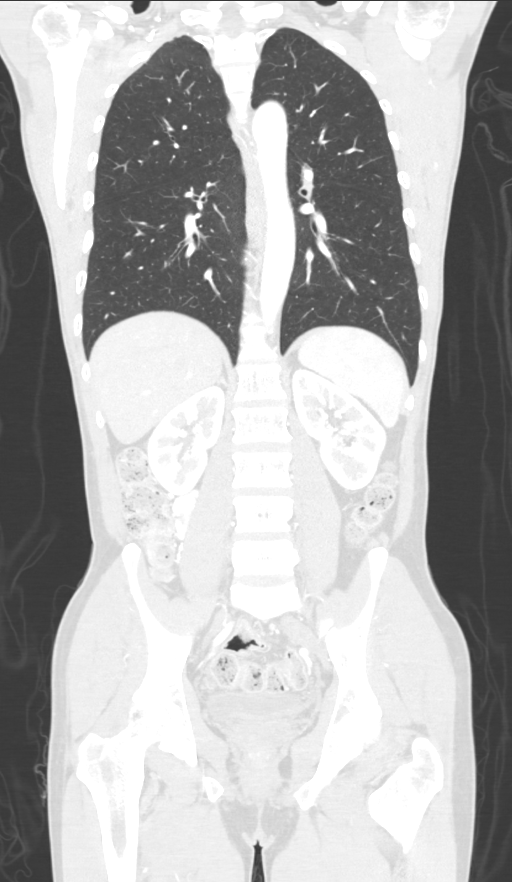

[12 of 36 positions shown; findings below may reference images not displayed]

FINDINGS: CT CHEST FINDINGS

Mediastinum/Lymph Nodes: Heart size is normal. There is no
significant pericardial fluid, thickening or pericardial
calcification. No pathologically enlarged mediastinal or hilar lymph
nodes. Esophagus is unremarkable in appearance. No axillary
lymphadenopathy.

Lungs/Pleura: 8 x 6 mm (mean diameter of 7 mm) ground-glass
attenuation nodule near the apex of the right upper lobe (image 28
of series 3) is similar to the prior study (differences in
measurement on today's examination likely reflect thinner 2 mm slice
reconstructions (previously 5 mm)). No other larger more suspicious
appearing pulmonary nodules or masses are noted. No acute
consolidative airspace disease. No pleural effusions.

Musculoskeletal/Soft Tissues: There are no aggressive appearing
lytic or blastic lesions noted in the visualized portions of the
skeleton.

CT ABDOMEN AND PELVIS FINDINGS

Hepatobiliary: No cystic or solid hepatic lesions. No intra or
extrahepatic biliary ductal dilatation. Status post cholecystectomy.

Pancreas: Postoperative changes of pancreaticoduodenectomy are again
noted. The remaining body and tail of the pancreas are normal in
appearance, without definite pancreatic mass. No pancreatic ductal
dilatation. No pancreatic or peripancreatic fluid or inflammatory
changes.

Spleen: Unremarkable.

Adrenals/Urinary Tract: The appearance of the kidneys and bilateral
adrenal glands is normal. No hydroureteronephrosis. Urinary bladder
is normal in appearance.

Stomach/Bowel: Postoperative changes of partial gastrectomy and
pancreaticoduodenectomy are redemonstrated. No definite soft tissue
mass in the surgical bed to suggest local recurrence of disease. No
pathologic dilatation of small bowel or colon. The appendix is not
confidently identified may be surgically absent. Regardless, there
are no inflammatory changes noted adjacent to the cecum to suggest
the presence of an acute appendicitis at this time.

Vascular/Lymphatic: Minimal atherosclerosis is noted throughout the
abdominal and pelvic vasculature, without evidence of aneurysm or
dissection. No lymphadenopathy is noted in the abdomen or pelvis.

Reproductive: Prostate gland and seminal vesicles are unremarkable
in appearance.

Other: No significant volume of ascites.  No pneumoperitoneum.

Musculoskeletal: There are no aggressive appearing lytic or blastic
lesions noted in the visualized portions of the skeleton.
IMPRESSION: 1. Status post partial gastrectomy and pancreaticoduodenectomy, with
no findings to suggest local recurrence of disease or metastatic
disease in the chest, abdomen or pelvis.
2. Small appear ground-glass attenuation nodule in the apex of the
right upper lobe with a mean diameter of 7 mm, stable compared to
prior examination. This is highly nonspecific and statistically
likely benign, but continued attention on routine followup studies
is recommended.
3. Mild atherosclerosis.

## 2017-08-06 ENCOUNTER — Ambulatory Visit (HOSPITAL_BASED_OUTPATIENT_CLINIC_OR_DEPARTMENT_OTHER): Payer: Commercial Managed Care - PPO | Admitting: Hematology & Oncology

## 2017-08-06 ENCOUNTER — Encounter: Payer: Self-pay | Admitting: Hematology & Oncology

## 2017-08-06 ENCOUNTER — Other Ambulatory Visit: Payer: Self-pay

## 2017-08-06 ENCOUNTER — Telehealth: Payer: Self-pay | Admitting: *Deleted

## 2017-08-06 ENCOUNTER — Other Ambulatory Visit (HOSPITAL_BASED_OUTPATIENT_CLINIC_OR_DEPARTMENT_OTHER): Payer: Commercial Managed Care - PPO

## 2017-08-06 VITALS — BP 113/69 | HR 64 | Temp 98.2°F | Resp 16 | Wt 125.0 lb

## 2017-08-06 DIAGNOSIS — C178 Malignant neoplasm of overlapping sites of small intestine: Secondary | ICD-10-CM

## 2017-08-06 DIAGNOSIS — Z85068 Personal history of other malignant neoplasm of small intestine: Secondary | ICD-10-CM

## 2017-08-06 DIAGNOSIS — Z08 Encounter for follow-up examination after completed treatment for malignant neoplasm: Secondary | ICD-10-CM | POA: Diagnosis not present

## 2017-08-06 DIAGNOSIS — Z720 Tobacco use: Secondary | ICD-10-CM

## 2017-08-06 LAB — CBC WITH DIFFERENTIAL (CANCER CENTER ONLY)
BASO#: 0 10*3/uL (ref 0.0–0.2)
BASO%: 0.4 % (ref 0.0–2.0)
EOS ABS: 0.2 10*3/uL (ref 0.0–0.5)
EOS%: 4.3 % (ref 0.0–7.0)
HCT: 41 % (ref 38.7–49.9)
HGB: 14 g/dL (ref 13.0–17.1)
LYMPH#: 1.1 10*3/uL (ref 0.9–3.3)
LYMPH%: 23.1 % (ref 14.0–48.0)
MCH: 31 pg (ref 28.0–33.4)
MCHC: 34.1 g/dL (ref 32.0–35.9)
MCV: 91 fL (ref 82–98)
MONO#: 0.4 10*3/uL (ref 0.1–0.9)
MONO%: 7.5 % (ref 0.0–13.0)
NEUT#: 3.2 10*3/uL (ref 1.5–6.5)
NEUT%: 64.7 % (ref 40.0–80.0)
PLATELETS: 120 10*3/uL — AB (ref 145–400)
RBC: 4.52 10*6/uL (ref 4.20–5.70)
RDW: 13.3 % (ref 11.1–15.7)
WBC: 4.9 10*3/uL (ref 4.0–10.0)

## 2017-08-06 LAB — CMP (CANCER CENTER ONLY)
ALT(SGPT): 27 U/L (ref 10–47)
AST: 30 U/L (ref 11–38)
Albumin: 3.5 g/dL (ref 3.3–5.5)
Alkaline Phosphatase: 108 U/L — ABNORMAL HIGH (ref 26–84)
BILIRUBIN TOTAL: 1.1 mg/dL (ref 0.20–1.60)
BUN: 12 mg/dL (ref 7–22)
CHLORIDE: 104 meq/L (ref 98–108)
CO2: 30 meq/L (ref 18–33)
CREATININE: 0.9 mg/dL (ref 0.6–1.2)
Calcium: 9 mg/dL (ref 8.0–10.3)
Glucose, Bld: 134 mg/dL — ABNORMAL HIGH (ref 73–118)
Potassium: 4.6 mEq/L (ref 3.3–4.7)
SODIUM: 147 meq/L — AB (ref 128–145)
TOTAL PROTEIN: 6.1 g/dL — AB (ref 6.4–8.1)

## 2017-08-06 LAB — CEA (IN HOUSE-CHCC): CEA (CHCC-IN HOUSE): 5.71 ng/mL — AB (ref 0.00–5.00)

## 2017-08-06 NOTE — Progress Notes (Signed)
Hematology and Oncology Follow Up Visit  Keith [name] [id number] 1971/03/16 46 y.o. 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 Daily     Interim History:  Mr.  Keith [name] is back for followup.  He is doing okay.  He comes in with his wife.  Usually, there is an issue when his wife shows up.  The issue now is that he has started to smoke again.  This may have happened when he went back to [place] for vacation.  I told him that smoking is the worst that he can do that we will allow his cancer to come back.  He is not gaining weight.  Some of this might be secondary to him smoking.  He has had occasional diarrhea.  He is still on Creon.  I told him to keep on the Creon.  He is still working.  He is having no problems with work.  He does like to run.  He is running a mile or so a day.  He has had no bleeding.  He has had no fever.  He has had no cough or shortness of breath.  Overall, his performance status is ECOG 1.   Medications:  Current Outpatient Medications:  .  CREON 36000 units CPEP capsule, TAKE 1 CAPSULE BY MOUTH THREE TIMES DAILY WITH MEALS, Disp: 90 capsule, Rfl: 0 .  CREON 36000 units CPEP capsule, TAKE 1 CAPSULE BY MOUTH THREE TIMES DAILY WITH MEALS, Disp: 90 capsule, Rfl: 0 .  CREON 36000 units CPEP capsule, TAKE 1 CAPSULE BY MOUTH THREE TIMES DAILY WITH MEALS, Disp: 90 capsule, Rfl: 0  Allergies: No Known Allergies  Past Medical History, Surgical history, Social history, and Family History were reviewed and updated.  Review of Systems: As stated in the interim history  Physical Exam:  weight is 125 lb (56.7 kg). His oral temperature is 98.2 F (36.8 C). His blood pressure is 113/69 and his pulse is 64. His respiration is 16 and oxygen saturation is 100%.   Thin but well-nourished [place] male.  Head and neck  exam shows no ocular or oral lesions.  There are no palpable cervical or supra clavicular lymph nodes.  Lungs are clear bilaterally.  Cardiac exam regular rate and rhythm with no murmurs, rubs or bruits.  Abdomen is soft.  His laparotomy scar is well-healed.  He has no fluid wave.  There is no palpable abdominal mass.  There is no guarding or rebound tenderness.  He has no palpable liver or spleen tip.  Back exam shows no tenderness over the spine, ribs or hips.  Extremities shows no clubbing, cyanosis or edema.  Skin exam shows no rashes, ecchymoses or petechia.  Neurological exam is nonfocal.  Lab Results  Component Value Date   WBC 4.9 08/06/2017   HGB 14.0 08/06/2017   HCT 41.0 08/06/2017   MCV 91 08/06/2017   PLT 120 (L) 08/06/2017     Chemistry      Component Value Date/Time   NA 147 (H) 08/06/2017 0816   NA 144 02/04/2017 0758   K 4.6 08/06/2017 0816   K 4.6 02/04/2017 0758   CL 104 08/06/2017 0816   CO2 30 08/06/2017 0816   CO2 25 02/04/2017 0758   BUN 12 08/06/2017 0816   BUN 14.2 02/04/2017 0758   CREATININE 0.9 08/06/2017 0816  CREATININE 0.9 02/04/2017 0758      Component Value Date/Time   CALCIUM 9.0 08/06/2017 0816   CALCIUM 8.9 02/04/2017 0758   ALKPHOS 108 (H) 08/06/2017 0816   ALKPHOS 113 02/04/2017 0758   AST 30 08/06/2017 0816   AST 24 02/04/2017 0758   ALT 27 08/06/2017 0816   ALT 20 02/04/2017 0758   BILITOT 1.10 08/06/2017 0816   BILITOT 0.91 02/04/2017 0758         Impression and Plan: [name] is 46 year old gentleman. He has a locally advanced small bowel cancer. He underwent resection. He has 9 positive lymph nodes. As such, he is at risk for recurrence. He underwent aggressive adjuvant therapy with both chemotherapy and radiation therapy. He completed his back in October 2015.  I it is quite upsetting that he is now smoking.  His wife came in today.  I knew that there is a reason that she was here.  I told him that if he continues to smoke,  he will increase his risk of his cancer coming back.  I know that it is been a little over 3 years that he has completed treatment.  I told him that he really should also take aspirin.  Baby aspirin-81 mg p.o. daily-would be helpful and I think will decrease his risk of his cancer recurring.  I also told him to watch the added table sugar intake.  I do not think we need any scans.  I will see him back in 6 months.  I spent about 30 minutes with him.  I went over the "illness" of tobacco use.  I really told him that if his cancer came back, it will not be curable.  It can still be treated but it could not be cured.  Hopefully he understood this.    Volanda Napoleon, MD 11/30/20189:12 AM

## 2017-08-06 NOTE — Telephone Encounter (Addendum)
Patient is aware of results  ----- Message from Volanda Napoleon, MD sent at 08/06/2017  3:10 PM EST ----- Call - the tumor level is stable!!m  STOP smoking!!!  Keith [name]

## 2017-10-22 ENCOUNTER — Other Ambulatory Visit: Payer: Self-pay | Admitting: Hematology & Oncology

## 2017-11-24 ENCOUNTER — Other Ambulatory Visit: Payer: Self-pay | Admitting: Hematology & Oncology

## 2017-12-25 ENCOUNTER — Other Ambulatory Visit: Payer: Self-pay | Admitting: Hematology & Oncology

## 2018-01-31 ENCOUNTER — Other Ambulatory Visit: Payer: Self-pay | Admitting: Hematology & Oncology

## 2018-02-04 ENCOUNTER — Inpatient Hospital Stay: Payer: Commercial Managed Care - PPO | Attending: Hematology & Oncology | Admitting: Hematology & Oncology

## 2018-02-04 ENCOUNTER — Encounter: Payer: Self-pay | Admitting: Hematology & Oncology

## 2018-02-04 ENCOUNTER — Inpatient Hospital Stay: Payer: Commercial Managed Care - PPO

## 2018-02-04 ENCOUNTER — Other Ambulatory Visit: Payer: Self-pay

## 2018-02-04 VITALS — BP 111/76 | HR 56 | Temp 98.3°F | Resp 18 | Wt 124.0 lb

## 2018-02-04 DIAGNOSIS — F17210 Nicotine dependence, cigarettes, uncomplicated: Secondary | ICD-10-CM | POA: Insufficient documentation

## 2018-02-04 DIAGNOSIS — C178 Malignant neoplasm of overlapping sites of small intestine: Secondary | ICD-10-CM

## 2018-02-04 DIAGNOSIS — C779 Secondary and unspecified malignant neoplasm of lymph node, unspecified: Secondary | ICD-10-CM | POA: Diagnosis not present

## 2018-02-04 DIAGNOSIS — C179 Malignant neoplasm of small intestine, unspecified: Secondary | ICD-10-CM

## 2018-02-04 LAB — CBC WITH DIFFERENTIAL (CANCER CENTER ONLY)
Basophils Absolute: 0 10*3/uL (ref 0.0–0.1)
Basophils Relative: 1 %
Eosinophils Absolute: 0.3 10*3/uL (ref 0.0–0.5)
Eosinophils Relative: 7 %
HCT: 41.4 % (ref 38.7–49.9)
HEMOGLOBIN: 14 g/dL (ref 13.0–17.1)
LYMPHS ABS: 0.8 10*3/uL — AB (ref 0.9–3.3)
LYMPHS PCT: 21 %
MCH: 30.7 pg (ref 28.0–33.4)
MCHC: 33.8 g/dL (ref 32.0–35.9)
MCV: 90.8 fL (ref 82.0–98.0)
Monocytes Absolute: 0.4 10*3/uL (ref 0.1–0.9)
Monocytes Relative: 9 %
NEUTROS PCT: 62 %
Neutro Abs: 2.4 10*3/uL (ref 1.5–6.5)
Platelet Count: 124 10*3/uL — ABNORMAL LOW (ref 145–400)
RBC: 4.56 MIL/uL (ref 4.20–5.70)
RDW: 13.2 % (ref 11.1–15.7)
WBC: 3.9 10*3/uL — AB (ref 4.0–10.0)

## 2018-02-04 LAB — CMP (CANCER CENTER ONLY)
ALK PHOS: 118 U/L — AB (ref 26–84)
ALT: 62 U/L — ABNORMAL HIGH (ref 10–47)
ANION GAP: 6 (ref 5–15)
AST: 61 U/L — ABNORMAL HIGH (ref 11–38)
Albumin: 3.7 g/dL (ref 3.5–5.0)
BUN: 15 mg/dL (ref 7–22)
CO2: 30 mmol/L (ref 18–33)
Calcium: 9.2 mg/dL (ref 8.0–10.3)
Chloride: 111 mmol/L — ABNORMAL HIGH (ref 98–108)
Creatinine: 1.1 mg/dL (ref 0.60–1.20)
Glucose, Bld: 98 mg/dL (ref 73–118)
POTASSIUM: 4.4 mmol/L (ref 3.3–4.7)
Sodium: 147 mmol/L — ABNORMAL HIGH (ref 128–145)
TOTAL PROTEIN: 6.6 g/dL (ref 6.4–8.1)
Total Bilirubin: 1.2 mg/dL (ref 0.2–1.6)

## 2018-02-04 LAB — LACTATE DEHYDROGENASE: LDH: 188 U/L (ref 125–245)

## 2018-02-04 NOTE — Progress Notes (Signed)
Hematology and Oncology Follow Up Visit  Akshith Moncus [id number] [birth year] 47 y.o. 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 Daily     Interim History:  Mr.  Mays is back for followup.  He is doing well.  He is running 2 or 3 times a week.  He is still working.  He has had no problems with fatigue or weakness.  He has had no abdominal pain.  He has had no change in bowel or bladder habits.  He has had no nausea or vomiting.  He has had no leg swelling.  He has had no rashes.  His last CEA level was 5.1 back in November.  He had no problems over the winter or spring.  He had no flu.  He had no infections.  I think he is still smoking a little bit.  I told him about this.  Again he really needs to cut this out.  Overall, his performance status is ECOG 0.  Medications:  Current Outpatient Medications:  .  CREON 36000 units CPEP capsule, TAKE 1 CAPSULE BY MOUTH THREE TIMES DAILY WITH MEALS, Disp: 90 capsule, Rfl: 0  Allergies: No Known Allergies  Past Medical History, Surgical history, Social history, and Family History were reviewed and updated.  Review of Systems: Review of Systems  Constitutional: Negative.   HENT: Negative.  Negative for hearing loss.   Eyes: Negative.   Respiratory: Negative.   Cardiovascular: Negative.   Gastrointestinal: Negative.   Genitourinary: Negative.   Musculoskeletal: Negative.   Skin: Negative.   Neurological: Negative.   Endo/Heme/Allergies: Negative.   Psychiatric/Behavioral: Negative.      Physical Exam:  weight is 124 lb (56.2 kg). His oral temperature is 98.3 F (36.8 C). His blood pressure is 111/76 and his pulse is 56 (abnormal). His respiration is 18 and oxygen saturation is 100%.   Physical Exam  Constitutional: He is oriented to person, place, and time.  HENT:    Head: Normocephalic and atraumatic.  Mouth/Throat: Oropharynx is clear and moist.  Eyes: Pupils are equal, round, and reactive to light. EOM are normal.  Neck: Normal range of motion.  Cardiovascular: Normal rate, regular rhythm and normal heart sounds.  Pulmonary/Chest: Effort normal and breath sounds normal.  Abdominal: Soft. Bowel sounds are normal.  Musculoskeletal: Normal range of motion. He exhibits no edema, tenderness or deformity.  Lymphadenopathy:    He has no cervical adenopathy.  Neurological: He is alert and oriented to person, place, and time.  Skin: Skin is warm and dry. No rash noted. No erythema.  Psychiatric: He has a normal mood and affect. His behavior is normal. Judgment and thought content normal.  Vitals reviewed.    Lab Results  Component Value Date   WBC 3.9 (L) 02/04/2018   HGB 14.0 02/04/2018   HCT 41.4 02/04/2018   MCV 90.8 02/04/2018   PLT 124 (L) 02/04/2018     Chemistry      Component Value Date/Time   NA 147 (H) 08/06/2017 0816   NA 144 02/04/2017 0758   K 4.6 08/06/2017 0816   K 4.6 02/04/2017 0758   CL 104 08/06/2017 0816   CO2 30 08/06/2017 0816   CO2 25 02/04/2017 0758   BUN 12 08/06/2017 0816   BUN 14.2 02/04/2017 0758   CREATININE 0.9 08/06/2017 0816  CREATININE 0.9 02/04/2017 0758      Component Value Date/Time   CALCIUM 9.0 08/06/2017 0816   CALCIUM 8.9 02/04/2017 0758   ALKPHOS 108 (H) 08/06/2017 0816   ALKPHOS 113 02/04/2017 0758   AST 30 08/06/2017 0816   AST 24 02/04/2017 0758   ALT 27 08/06/2017 0816   ALT 20 02/04/2017 0758   BILITOT 1.10 08/06/2017 0816   BILITOT 0.91 02/04/2017 0758         Impression and Plan: Mr. Hansley is 47 year old gentleman. He has a locally advanced small bowel cancer. He underwent resection. He has 9 positive lymph nodes. As such, he is at risk for recurrence. He underwent aggressive adjuvant therapy with both chemotherapy and radiation therapy. He completed his back in October  2015.  At this point, I think that we can let him go from the practice.  I just do not think that we are adding much of his health care at this point.  I think that the risk of his cancer coming back is easily less than 5%.  Hopefully, the smoking will stop.  I am not sure if it will but he definitely will improve his long-term chances if he does stop.  We will be more than happy to see him back down the road if he has any problems.  Volanda Napoleon, MD 5/31/20198:29 AM

## 2018-02-05 LAB — CANCER ANTIGEN 19-9: CAN 19-9: 21 U/mL (ref 0–35)

## 2018-05-23 ENCOUNTER — Encounter: Payer: Self-pay | Admitting: Family Medicine

## 2018-05-23 ENCOUNTER — Ambulatory Visit: Payer: Commercial Managed Care - PPO | Admitting: Family Medicine

## 2018-05-23 VITALS — BP 100/70 | HR 60 | Temp 98.2°F | Resp 12 | Ht 67.0 in | Wt 128.1 lb

## 2018-05-23 DIAGNOSIS — Z13220 Encounter for screening for lipoid disorders: Secondary | ICD-10-CM

## 2018-05-23 DIAGNOSIS — Z131 Encounter for screening for diabetes mellitus: Secondary | ICD-10-CM | POA: Diagnosis not present

## 2018-05-23 DIAGNOSIS — R7989 Other specified abnormal findings of blood chemistry: Secondary | ICD-10-CM | POA: Insufficient documentation

## 2018-05-23 DIAGNOSIS — R945 Abnormal results of liver function studies: Secondary | ICD-10-CM | POA: Diagnosis not present

## 2018-05-23 DIAGNOSIS — C178 Malignant neoplasm of overlapping sites of small intestine: Secondary | ICD-10-CM

## 2018-05-23 NOTE — Assessment & Plan Note (Addendum)
5 years cancer free, feeling well. No longer following with oncologist. It is not clear if he needs surveillance EGD/colonoscopy. Colonoscopy with Dr. Earlean Shawl in 2014.

## 2018-05-23 NOTE — Patient Instructions (Addendum)
A few things to remember from today's visit:   Screening for lipid disorders  Abnormal liver function test  Diabetes mellitus screening  Cancer of small intestine, including duodenum ([place])  Prescription with lab order for you to to at work. If everything I can see you back annually.

## 2018-05-23 NOTE — Progress Notes (Signed)
HPI:   Keith [name] is a 47 y.o.[place] male, who is here today to establish care.  Former PCP: N/A Last preventive routine visit: 2014  Chronic medical problems: Small intestine cancer, chronic hep B,IFG, and GERD among some. HTN on non pharmacologic treatment (per records,12/2012) Former smoker.   Diagnosed with colon cancer about 5 years ago,s/p tumor resection,Whipple type procedure. He completed radiation and chemotherapy in 06/2014 Until recently he was following annually with oncologist, Dr Keith [name].  According to patient, because he has been 5 years cancer free he was released.  He follows a healthy diet and exercise regularly, he runs 3 times per week.   Today he is concerned about not being able to gain weight. His weight has been stable since he was diagnosed with colon cancer. He states that he eats well and has a good appetite.   History of chronic hepatitis B.  He denies any abdominal pain, nausea, vomiting, color changes in his stool/urine.   Lab Results  Component Value Date   ALT 62 (H) 02/04/2018   AST 61 (H) 02/04/2018   ALKPHOS 118 (H) 02/04/2018   BILITOT 1.2 02/04/2018     Review of Systems  Constitutional: Negative for activity change, appetite change, fatigue, fever and unexpected weight change.  HENT: Negative for nosebleeds, sore throat and trouble swallowing.   Eyes: Negative for redness and visual disturbance.  Respiratory: Negative for cough, shortness of breath and wheezing.   Cardiovascular: Negative for chest pain, palpitations and leg swelling.  Gastrointestinal: Negative for abdominal pain, blood in stool, nausea and vomiting.  Genitourinary: Negative for decreased urine volume, dysuria and hematuria.  Musculoskeletal: Negative for back pain and myalgias.  Neurological: Negative for dizziness, syncope, weakness and headaches.      Current Outpatient Medications on File Prior to Visit  Medication Sig Dispense Refill  . CREON  36000 units CPEP capsule TAKE 1 CAPSULE BY MOUTH THREE TIMES DAILY WITH MEALS 90 capsule 0   No current facility-administered medications on file prior to visit.      Past Medical History:  Diagnosis Date  . Cancer of small intestine, including duodenum (Keith [name]) 10/26/2013  . CHF (congestive heart failure) ([place])   . Chronic hepatitis B ([place])   . Diabetes mellitus without complication ([place])   . GERD (gastroesophageal reflux disease)   . Hypertension   . Impaired fasting glucose   . Radiation 01/31/14-03/06/14   upper abdomen/surgical bed   No Known Allergies  Family History  Problem Relation Age of Onset  . Cancer Mother        Ureter Cancer  . Hypertension Unknown   . Colon cancer Sister 26  . Cancer Sister 20       colon  . Esophageal cancer Father 85  . Cancer Father        thyroid    Social History   Socioeconomic History  . Marital status: Married    Spouse name: Keith [name]   . Number of children: 2  . Years of education: 36  . Highest education level: Not on file  Occupational History  . Occupation: TECHNICAL SUPPORT     Employer: [employer]    Comment: [employer]   Social Needs  . Financial resource strain: Not on file  . Food insecurity:    Worry: Not on file    Inability: Not on file  . Transportation needs:    Medical: Not on file    Non-medical: Not on file  Tobacco Use  . Smoking status: Former Smoker    Packs/day: 0.25    Years: 25.00    Pack years: 6.25    Types: Cigarettes    Start date: 09/07/1986    Last attempt to quit: 09/13/2013    Years since quitting: 4.6  . Smokeless tobacco: Never Used  . Tobacco comment: quit January of 2015  Substance and Sexual Activity  . Alcohol use: No    Alcohol/week: 0.0 standard drinks    Comment: He drinks rarely   . Drug use: No  . Sexual activity: Yes    Birth control/protection: Surgical    Comment: Wife had a BTL  Lifestyle  . Physical activity:    Days per week: Not on file    Minutes per  session: Not on file  . Stress: Not on file  Relationships  . Social connections:    Talks on phone: Not on file    Gets together: Not on file    Attends religious service: Not on file    Active member of club or organization: Not on file    Attends meetings of clubs or organizations: Not on file    Relationship status: Not on file  Other Topics Concern  . 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  He has smoked at least 20 years.     Alcohol Use:  Rarely    Exercise:  Limited    Hobbies: Music             Vitals:   05/23/18 1628  BP: 100/70  Pulse: 60  Resp: 12  Temp: 98.2 F (36.8 C)  SpO2: 98%    Body mass index is 20.07 kg/m.   Physical Exam  Nursing note reviewed. Constitutional: He is oriented to person, place, and time. He appears well-developed and well-nourished. No distress.  HENT:  Head: Normocephalic and atraumatic.  Mouth/Throat: Oropharynx is clear and moist and mucous membranes are normal.  Eyes: Pupils are equal, round, and reactive to light. Conjunctivae are normal.  Cardiovascular: Normal rate and regular rhythm.  No murmur heard. Pulses:      Dorsalis pedis pulses are 2+ on the right side, and 2+ on the left side.  Respiratory: Effort normal and breath sounds normal. No respiratory distress.  GI: Soft. He exhibits no mass. There is no hepatomegaly. There is no tenderness.  Musculoskeletal: He exhibits no edema.  Lymphadenopathy:    He has no cervical adenopathy.  Neurological: He is alert and oriented to person, place, and time. He has normal strength. No cranial nerve deficit. Gait normal.  Skin: Skin is warm. No rash noted. No erythema.    Psychiatric: He has a normal mood and affect. Cognition and memory are normal.  Well groomed, good eye contact.     ASSESSMENT AND PLAN:  [name] was seen today for establish care.  Diagnoses and all orders for this visit:  Rx scrip given ,so he can have labs done at work: [employer].  Abnormal liver function test Hx of chronic hepatitis B. Asymptomatic. Further recommendations will be given according to LFTs.  He would like to do labs at work, so  prescription scrip given with lab orders.  Cancer of small intestine, including duodenum 5 years cancer free, feeling well. No longer following with oncologist. It is not clear if he needs surveillance EGD/colonoscopy. Colonoscopy with Dr. Earlean Shawl in 2014.  Screening for lipid disorders -FLP ordered.  Diabetes mellitus screening -HgA1C ordered.  He was reassured in regard to his weight, normal BMI. Continue monitoring.    Jaymes Revels G. [place], MD  [facility]. [place] office.

## 2018-05-23 NOTE — Assessment & Plan Note (Signed)
Hx of chronic hepatitis B. Asymptomatic. Further recommendations will be given according to LFTs.  He would like to do labs at work, so prescription scrip given with lab orders.

## 2019-08-01 ENCOUNTER — Telehealth (INDEPENDENT_AMBULATORY_CARE_PROVIDER_SITE_OTHER): Payer: Commercial Managed Care - PPO | Admitting: Family Medicine

## 2019-08-01 ENCOUNTER — Encounter: Payer: Self-pay | Admitting: Family Medicine

## 2019-08-01 ENCOUNTER — Other Ambulatory Visit: Payer: Self-pay

## 2019-08-01 VITALS — Ht 67.0 in

## 2019-08-01 DIAGNOSIS — M545 Low back pain, unspecified: Secondary | ICD-10-CM

## 2019-08-01 MED ORDER — TIZANIDINE HCL 4 MG PO TABS: 4.0000 mg | ORAL_TABLET | Freq: Four times a day (QID) | ORAL | 1 refills | Status: AC | PRN

## 2019-08-01 NOTE — Progress Notes (Signed)
Virtual Visit via Video Note   I connected with Keith [name] on 08/01/19 by a video enabled telemedicine application and verified that I am speaking with the correct person using two identifiers.  Location patient: home Location provider:work office Persons participating in the virtual visit: patient, provider  I discussed the limitations of evaluation and management by telemedicine and the availability of in person appointments. The patient expressed understanding and agreed to proceed.   HPI: Keith [name] is a 48 yo male with hx of GERD,duodenal cancer,and HTN c/o 4 days of left-sided lower back pain. This is a new problem. He denies any recent injury or unusual level of activity.  Pain is sometimes radiated to anterior aspect of left thigh, "like a wave", cannot describe type of pain. 7-8/10 in intensity, with no associated LE numbness, tingling, urinary incontinence or retention, stool incontinence, or saddle anesthesia.  Exacerbated by prolonged rest. Alleviated by movement,walking,and standing. No rash or edema on area, fever, chills, or abnormal wt loss. He denies gross hematuria, dysuria, increasing urine frequency, or urgency. No history of nephrolithiasis.  Prior Hx of back pain: Denies.   OTC medications: Tylenol and applying a topical "gel."   ROS: See pertinent positives and negatives per HPI.  Past Medical History:  Diagnosis Date  . Cancer of small intestine, including duodenum ([place]) 10/26/2013  . CHF (congestive heart failure) ([place])   . Chronic hepatitis B ([place])   . Diabetes mellitus without complication ([place])   . GERD (gastroesophageal reflux disease)   . Hypertension   . Impaired fasting glucose   . Radiation 01/31/14-03/06/14   upper abdomen/surgical bed    Past Surgical History:  Procedure Laterality Date  . ABDOMINAL EXPLORATION SURGERY  09/19/2013   done in [place]  . APPENDECTOMY  1997  . PANCREATICODUODENECTOMY  09/19/2013  . PARTIAL GASTRECTOMY  09/19/2013     Family History  Problem Relation Age of Onset  . Cancer Mother        Ureter Cancer  . Hypertension Unknown   . Colon cancer Sister 37  . Cancer Sister 20       colon  . Esophageal cancer Father 49  . Cancer Father        thyroid    Social History   Socioeconomic History  . Marital status: Married    Spouse name: Keith [name]   . Number of children: 2  . Years of education: 69  . Highest education level: Not on file  Occupational History  . Occupation: TECHNICAL SUPPORT     Employer: [employer]    Comment: [employer]   Social Needs  . Financial resource strain: Not on file  . Food insecurity    Worry: Not on file    Inability: Not on file  . Transportation needs    Medical: Not on file    Non-medical: Not on file  Tobacco Use  . Smoking status: Former Smoker    Packs/day: 0.25    Years: 25.00    Pack years: 6.25    Types: Cigarettes    Start date: 09/07/1986    Quit date: 09/13/2013    Years since quitting: 5.8  . Smokeless tobacco: Never Used  . Tobacco comment: quit January of 2015  Substance and Sexual Activity  . Alcohol use: No    Alcohol/week: 0.0 standard drinks    Comment: He drinks rarely   . Drug use: No  . Sexual activity: Yes    Birth control/protection: Surgical    Comment:  Wife had a BTL  Lifestyle  . Physical activity    Days per week: Not on file    Minutes per session: Not on file  . Stress: Not on file  Relationships  . Social Herbalist on phone: Not on file    Gets together: Not on file    Attends religious service: Not on file    Active member of club or organization: Not on file    Attends meetings of clubs or organizations: Not on file    Relationship status: Not on file  . Intimate partner violence    Fear of current or ex partner: Not on file    Emotionally abused: Not on file    Physically abused: Not on file    Forced sexual activity: Not on file  Other Topics Concern  . 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  He has smoked at least 20 years.     Alcohol Use:  Rarely    Exercise:  Limited    Hobbies: Music             Current Outpatient Medications:  .  CREON 36000 units CPEP capsule, TAKE 1 CAPSULE BY MOUTH THREE TIMES DAILY WITH MEALS, Disp: 90 capsule, Rfl: 0 .  tiZANidine (ZANAFLEX) 4 MG tablet, Take 1 tablet (4 mg total) by mouth every 6 (six) hours as needed for up to 15 days for muscle spasms., Disp: 30 tablet, Rfl: 1  EXAM:  VITALS per patient if applicable:Ht 5\' 7"  (1.702 m)   BMI 20.07 kg/m   GENERAL: alert, oriented, appears well and in no acute distress  HEENT: atraumatic, conjunctiva clear, no obvious abnormalities on inspection.  NECK: normal movements of the head and neck  LUNGS: on inspection no signs of respiratory distress, breathing rate appears normal, no obvious gross SOB, gasping or wheezing  CV: no obvious cyanosis  MS: moves all visible extremities without noticeable abnormality  PSYCH/NEURO: pleasant and cooperative, no obvious depression or anxiety, speech and thought processing grossly intact  ASSESSMENT AND PLAN:  Discussed the following assessment and plan:  Acute left-sided low back pain, unspecified whether sciatica present - Plan: tiZANidine (ZANAFLEX) 4 MG tablet ? Radiculopathy (left thigh). He prefers to hold on oral prednisone. We discussed possible causes. Hx does not suggest a serious process.  For now I recommend local ice and a muscle relaxant at bedtime. Zanaflex side effects discussed. Monitor for rash and numbness. He was clearly instructed about warning signs. Given his history of duodenal cancer if problem is  persistent (3-4 weeks) we will need lumbar imaging, before if pain is worse. He voices understanding and agrees with plan.    I discussed the assessment and treatment plan with the patient. He was provided an opportunity to ask questions and all were answered. He agreed with the plan and demonstrated an understanding of the instructions.   The patient was advised to call back or seek an in-person evaluation if the symptoms worsen or if the condition fails to improve as anticipated.  Return if symptoms worsen or fail to improve.     [place], MD

## 2019-08-09 ENCOUNTER — Other Ambulatory Visit: Payer: Self-pay

## 2019-08-09 DIAGNOSIS — C178 Malignant neoplasm of overlapping sites of small intestine: Secondary | ICD-10-CM

## 2019-08-10 ENCOUNTER — Encounter: Payer: Self-pay | Admitting: Hematology & Oncology

## 2019-08-10 ENCOUNTER — Inpatient Hospital Stay: Payer: Commercial Managed Care - PPO | Attending: Hematology & Oncology | Admitting: Hematology & Oncology

## 2019-08-10 ENCOUNTER — Inpatient Hospital Stay: Payer: Commercial Managed Care - PPO

## 2019-08-10 ENCOUNTER — Other Ambulatory Visit: Payer: Self-pay

## 2019-08-10 VITALS — BP 142/90 | HR 58 | Temp 97.5°F | Resp 17 | Wt 123.0 lb

## 2019-08-10 DIAGNOSIS — C779 Secondary and unspecified malignant neoplasm of lymph node, unspecified: Secondary | ICD-10-CM | POA: Insufficient documentation

## 2019-08-10 DIAGNOSIS — C178 Malignant neoplasm of overlapping sites of small intestine: Secondary | ICD-10-CM | POA: Diagnosis not present

## 2019-08-10 DIAGNOSIS — Z791 Long term (current) use of non-steroidal anti-inflammatories (NSAID): Secondary | ICD-10-CM | POA: Diagnosis not present

## 2019-08-10 DIAGNOSIS — Z9221 Personal history of antineoplastic chemotherapy: Secondary | ICD-10-CM | POA: Diagnosis not present

## 2019-08-10 DIAGNOSIS — Z923 Personal history of irradiation: Secondary | ICD-10-CM | POA: Insufficient documentation

## 2019-08-10 DIAGNOSIS — Z7982 Long term (current) use of aspirin: Secondary | ICD-10-CM | POA: Diagnosis not present

## 2019-08-10 LAB — CMP (CANCER CENTER ONLY)
ALT: 20 U/L (ref 0–44)
AST: 22 U/L (ref 15–41)
Albumin: 4.3 g/dL (ref 3.5–5.0)
Alkaline Phosphatase: 95 U/L (ref 38–126)
Anion gap: 5 (ref 5–15)
BUN: 11 mg/dL (ref 6–20)
CO2: 27 mmol/L (ref 22–32)
Calcium: 8.6 mg/dL — ABNORMAL LOW (ref 8.9–10.3)
Chloride: 111 mmol/L (ref 98–111)
Creatinine: 1.02 mg/dL (ref 0.61–1.24)
GFR, Est AFR Am: >60 mL/min (ref >60)
GFR, Estimated: >60 mL/min (ref >60)
Glucose, Bld: 192 mg/dL — ABNORMAL HIGH (ref 70–99)
Potassium: 4.8 mmol/L (ref 3.5–5.1)
Sodium: 143 mmol/L (ref 135–145)
Total Bilirubin: 0.7 mg/dL (ref 0.3–1.2)
Total Protein: 6.4 g/dL — ABNORMAL LOW (ref 6.5–8.1)

## 2019-08-10 LAB — CBC WITH DIFFERENTIAL (CANCER CENTER ONLY)
Abs Immature Granulocytes: 0.01 10*3/uL (ref 0.00–0.07)
Basophils Absolute: 0 10*3/uL (ref 0.0–0.1)
Basophils Relative: 1 %
Eosinophils Absolute: 0.2 10*3/uL (ref 0.0–0.5)
Eosinophils Relative: 6 %
HCT: 37.7 % — ABNORMAL LOW (ref 39.0–52.0)
Hemoglobin: 12.5 g/dL — ABNORMAL LOW (ref 13.0–17.0)
Immature Granulocytes: 0 %
Lymphocytes Relative: 23 %
Lymphs Abs: 0.9 10*3/uL (ref 0.7–4.0)
MCH: 30.7 pg (ref 26.0–34.0)
MCHC: 33.2 g/dL (ref 30.0–36.0)
MCV: 92.6 fL (ref 80.0–100.0)
Monocytes Absolute: 0.3 10*3/uL (ref 0.1–1.0)
Monocytes Relative: 7 %
Neutro Abs: 2.4 10*3/uL (ref 1.7–7.7)
Neutrophils Relative %: 63 %
Platelet Count: 132 10*3/uL — ABNORMAL LOW (ref 150–400)
RBC: 4.07 MIL/uL — ABNORMAL LOW (ref 4.22–5.81)
RDW: 13 % (ref 11.5–15.5)
WBC Count: 3.7 10*3/uL — ABNORMAL LOW (ref 4.0–10.5)
nRBC: 0 % (ref 0.0–0.2)

## 2019-08-10 NOTE — Progress Notes (Signed)
Hematology and Oncology Follow Up Visit  Kaisei Kerchner [id number] Jul 19, 1971 48 y.o. 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 Daily     Interim History:  Mr.  Kuruc is back for followup.  It has been a year and a half since we last saw him.  He is now 5 years out from his treatment for the stage III small bowel cancer.  He apparently has some pain over on his left side.  This seemed to be radiating down to his inguinal area.  He called his family doctor.  She gave him some muscle relaxer which did seem to help a little bit.  Apparently his wife is very worried about this.  She wanted him to come back to see [place] to see if we might be able to help out.  He is active.  He is working.  He still running.  He does a 5K races.  His appetite is good.  He has not lost weight.  He has had no change in bowel or bladder habits.  There is been no cough.  Has had no bleeding.  He has had no rashes.  He has had no leg swelling.  I think that the best thing that we can probably do is do a CT scan to see if we can see anything.  I realize that he had 9+ lymph nodes that certainly puts him at a high risk for recurrence.  Overall, his performance status is ECOG 0.  Medications:  Current Outpatient Medications:  .  CREON 36000 units CPEP capsule, TAKE 1 CAPSULE BY MOUTH THREE TIMES DAILY WITH MEALS, Disp: 90 capsule, Rfl: 0 .  diclofenac Sodium (VOLTAREN) 1 % GEL, Apply 2 g topically 4 (four) times daily., Disp: , Rfl:  .  tiZANidine (ZANAFLEX) 4 MG tablet, Take 1 tablet (4 mg total) by mouth every 6 (six) hours as needed for up to 15 days for muscle spasms., Disp: 30 tablet, Rfl: 1  Allergies: No Known Allergies  Past Medical History, Surgical history, Social history, and Family History were reviewed and updated.  Review of Systems: Review of  Systems  Constitutional: Negative.   HENT: Negative.  Negative for hearing loss.   Eyes: Negative.   Respiratory: Negative.   Cardiovascular: Negative.   Gastrointestinal: Negative.   Genitourinary: Negative.   Musculoskeletal: Negative.   Skin: Negative.   Neurological: Negative.   Endo/Heme/Allergies: Negative.   Psychiatric/Behavioral: Negative.      Physical Exam:  weight is 123 lb (55.8 kg). His temporal temperature is 97.5 F (36.4 C) (abnormal). His blood pressure is 142/90 (abnormal) and his pulse is 58 (abnormal). His respiration is 17 and oxygen saturation is 100%.   Physical Exam Vitals signs reviewed.  HENT:     Head: Normocephalic and atraumatic.  Eyes:     Pupils: Pupils are equal, round, and reactive to light.  Neck:     Musculoskeletal: Normal range of motion.  Cardiovascular:     Rate and Rhythm: Normal rate and regular rhythm.     Heart sounds: Normal heart sounds.  Pulmonary:     Effort: Pulmonary effort is normal.     Breath sounds: Normal breath sounds.  Abdominal:     General: Bowel sounds are normal.     Palpations: Abdomen is soft.  Musculoskeletal: Normal range of motion.  General: No tenderness or deformity.  Lymphadenopathy:     Cervical: No cervical adenopathy.  Skin:    General: Skin is warm and dry.     Findings: No erythema or rash.  Neurological:     Mental Status: He is alert and oriented to person, place, and time.  Psychiatric:        Behavior: Behavior normal.        Thought Content: Thought content normal.        Judgment: Judgment normal.      Lab Results  Component Value Date   WBC 3.9 (L) 02/04/2018   HGB 14.0 02/04/2018   HCT 41.4 02/04/2018   MCV 90.8 02/04/2018   PLT 124 (L) 02/04/2018     Chemistry      Component Value Date/Time   NA 147 (H) 02/04/2018 0810   NA 147 (H) 08/06/2017 0816   NA 144 02/04/2017 0758   K 4.4 02/04/2018 0810   K 4.6 08/06/2017 0816   K 4.6 02/04/2017 0758   CL 111 (H)  02/04/2018 0810   CL 104 08/06/2017 0816   CO2 30 02/04/2018 0810   CO2 30 08/06/2017 0816   CO2 25 02/04/2017 0758   BUN 15 02/04/2018 0810   BUN 12 08/06/2017 0816   BUN 14.2 02/04/2017 0758   CREATININE 1.10 02/04/2018 0810   CREATININE 0.9 08/06/2017 0816   CREATININE 0.9 02/04/2017 0758      Component Value Date/Time   CALCIUM 9.2 02/04/2018 0810   CALCIUM 9.0 08/06/2017 0816   CALCIUM 8.9 02/04/2017 0758   ALKPHOS 118 (H) 02/04/2018 0810   ALKPHOS 108 (H) 08/06/2017 0816   ALKPHOS 113 02/04/2017 0758   AST 61 (H) 02/04/2018 0810   AST 24 02/04/2017 0758   ALT 62 (H) 02/04/2018 0810   ALT 27 08/06/2017 0816   ALT 20 02/04/2017 0758   BILITOT 1.2 02/04/2018 0810   BILITOT 0.91 02/04/2017 0758         Impression and Plan: Mr. Granum is 48 year old [place] gentleman. He has a locally advanced small bowel cancer. He underwent resection. He has 9 positive lymph nodes. As such, he is at risk for recurrence. He underwent aggressive adjuvant therapy with both chemotherapy and radiation therapy. He completed his back in October 2015.  I certainly can understand his concern, again, with the 9+ lymph nodes, this is certainly a concern.  I know that it has been 5 years now.  However, there is noise that possibility of recurrence.  We will go ahead and get a CT scan of his body.  This I think will be very helpful.  I really, really hope that we do not find any problems with respect to any disease recurrence.  If there is any evidence of disease recurrence, we will have to do a biopsy to prove this.  I will plan to get him back to see me in another 6 weeks.  We will have the CAT scan done next week.  I spent about 30 minutes with him.  Have not seen him for quite a while.  It was nice to catching up with him.  He is always been very nice.  He is fun to talk to.    Volanda Napoleon, MD 12/3/20202:14 PM

## 2019-08-11 LAB — LACTATE DEHYDROGENASE: LDH: 173 U/L (ref 98–192)

## 2019-08-14 LAB — CEA (IN HOUSE-CHCC): CEA (CHCC-In House): 5.48 ng/mL — ABNORMAL HIGH (ref 0.00–5.00)

## 2019-08-16 ENCOUNTER — Ambulatory Visit (HOSPITAL_BASED_OUTPATIENT_CLINIC_OR_DEPARTMENT_OTHER)
Admission: RE | Admit: 2019-08-16 | Discharge: 2019-08-16 | Disposition: A | Discharge status: Home / Self Care | Payer: Commercial Managed Care - PPO | Source: Ambulatory Visit | Attending: Hematology & Oncology | Admitting: Hematology & Oncology

## 2019-08-16 ENCOUNTER — Other Ambulatory Visit: Payer: Self-pay

## 2019-08-16 ENCOUNTER — Encounter (HOSPITAL_BASED_OUTPATIENT_CLINIC_OR_DEPARTMENT_OTHER): Payer: Self-pay

## 2019-08-16 DIAGNOSIS — C178 Malignant neoplasm of overlapping sites of small intestine: Secondary | ICD-10-CM

## 2019-08-16 MED ORDER — IOHEXOL 300 MG/ML  SOLN
100.0000 mL | Freq: Once | INTRAMUSCULAR | Status: AC | PRN
  Administered 2019-08-16: 08:00:00 100 mL via INTRAVENOUS

## 2019-09-18 ENCOUNTER — Encounter: Payer: Self-pay | Admitting: Hematology & Oncology

## 2019-09-18 ENCOUNTER — Inpatient Hospital Stay: Payer: Managed Care, Other (non HMO) | Admitting: Hematology & Oncology

## 2019-09-18 ENCOUNTER — Inpatient Hospital Stay (HOSPITAL_BASED_OUTPATIENT_CLINIC_OR_DEPARTMENT_OTHER): Payer: Managed Care, Other (non HMO) | Admitting: Hematology & Oncology

## 2019-09-18 ENCOUNTER — Inpatient Hospital Stay: Payer: Managed Care, Other (non HMO)

## 2019-09-18 ENCOUNTER — Inpatient Hospital Stay: Payer: Managed Care, Other (non HMO) | Attending: Hematology & Oncology

## 2019-09-18 ENCOUNTER — Other Ambulatory Visit: Payer: Self-pay

## 2019-09-18 VITALS — BP 123/95 | HR 68 | Temp 97.1°F | Resp 18 | Wt 123.0 lb

## 2019-09-18 DIAGNOSIS — R591 Generalized enlarged lymph nodes: Secondary | ICD-10-CM | POA: Diagnosis not present

## 2019-09-18 DIAGNOSIS — N1330 Unspecified hydronephrosis: Secondary | ICD-10-CM | POA: Diagnosis not present

## 2019-09-18 DIAGNOSIS — Z923 Personal history of irradiation: Secondary | ICD-10-CM | POA: Insufficient documentation

## 2019-09-18 DIAGNOSIS — Z85068 Personal history of other malignant neoplasm of small intestine: Secondary | ICD-10-CM | POA: Diagnosis present

## 2019-09-18 DIAGNOSIS — C178 Malignant neoplasm of overlapping sites of small intestine: Secondary | ICD-10-CM

## 2019-09-18 DIAGNOSIS — Z7982 Long term (current) use of aspirin: Secondary | ICD-10-CM | POA: Insufficient documentation

## 2019-09-18 DIAGNOSIS — Z9221 Personal history of antineoplastic chemotherapy: Secondary | ICD-10-CM | POA: Diagnosis not present

## 2019-09-18 DIAGNOSIS — R109 Unspecified abdominal pain: Secondary | ICD-10-CM | POA: Diagnosis not present

## 2019-09-18 LAB — CBC WITH DIFFERENTIAL (CANCER CENTER ONLY)
Abs Immature Granulocytes: 0 10*3/uL (ref 0.00–0.07)
Basophils Absolute: 0 10*3/uL (ref 0.0–0.1)
Basophils Relative: 1 %
Eosinophils Absolute: 0.3 10*3/uL (ref 0.0–0.5)
Eosinophils Relative: 8 %
HCT: 39.4 % (ref 39.0–52.0)
Hemoglobin: 13 g/dL (ref 13.0–17.0)
Immature Granulocytes: 0 %
Lymphocytes Relative: 21 %
Lymphs Abs: 0.9 10*3/uL (ref 0.7–4.0)
MCH: 30 pg (ref 26.0–34.0)
MCHC: 33 g/dL (ref 30.0–36.0)
MCV: 90.8 fL (ref 80.0–100.0)
Monocytes Absolute: 0.3 10*3/uL (ref 0.1–1.0)
Monocytes Relative: 6 %
Neutro Abs: 2.7 10*3/uL (ref 1.7–7.7)
Neutrophils Relative %: 64 %
Platelet Count: 130 10*3/uL — ABNORMAL LOW (ref 150–400)
RBC: 4.34 MIL/uL (ref 4.22–5.81)
RDW: 12.5 % (ref 11.5–15.5)
WBC Count: 4.3 10*3/uL (ref 4.0–10.5)
nRBC: 0 % (ref 0.0–0.2)

## 2019-09-18 LAB — CMP (CANCER CENTER ONLY)
ALT: 17 U/L (ref 0–44)
AST: 19 U/L (ref 15–41)
Albumin: 4.2 g/dL (ref 3.5–5.0)
Alkaline Phosphatase: 114 U/L (ref 38–126)
Anion gap: 5 (ref 5–15)
BUN: 14 mg/dL (ref 6–20)
CO2: 27 mmol/L (ref 22–32)
Calcium: 9.1 mg/dL (ref 8.9–10.3)
Chloride: 109 mmol/L (ref 98–111)
Creatinine: 1.8 mg/dL — ABNORMAL HIGH (ref 0.61–1.24)
GFR, Est AFR Am: 50 mL/min — ABNORMAL LOW (ref >60)
GFR, Estimated: 43 mL/min — ABNORMAL LOW (ref >60)
Glucose, Bld: 126 mg/dL — ABNORMAL HIGH (ref 70–99)
Potassium: 4.4 mmol/L (ref 3.5–5.1)
Sodium: 141 mmol/L (ref 135–145)
Total Bilirubin: 0.6 mg/dL (ref 0.3–1.2)
Total Protein: 6.5 g/dL (ref 6.5–8.1)

## 2019-09-18 LAB — LACTATE DEHYDROGENASE: LDH: 163 U/L (ref 98–192)

## 2019-09-18 NOTE — Progress Notes (Signed)
Hematology and Oncology Follow Up Visit  Joelle Branco [id number] 1970/09/28 49 y.o. 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 Daily     Interim History:  Mr.  Keith [name] is back for followup.  The last time he was here, we did a CT scan on him.  The CT scan shows a left hydronephrosis.  It sounds like there might be some kind of blockage in the distal ureter.  We will have to get him over to urology for this.  The radiologist also commented on subcentimeter lymph nodes in the retroperitoneum.  Of course, she said that metastatic disease cannot be excluded.  His last CEA level was stable at 5.5.  He is having some occasional pain in the left abdomen.  He has had no hematuria.  There is no urinary frequency.  There is no dysuria.  He has had no fever.  He has not lost any weight.  He has had no change in his bowel habits.  Overall, his performance status is ECOG 0.     Medications:  Current Outpatient Medications:  .  CREON 36000 units CPEP capsule, TAKE 1 CAPSULE BY MOUTH THREE TIMES DAILY WITH MEALS, Disp: 90 capsule, Rfl: 0  Allergies: No Known Allergies  Past Medical History, Surgical history, Social history, and Family History were reviewed and updated.  Review of Systems: Review of Systems  Constitutional: Negative.   HENT: Negative.  Negative for hearing loss.   Eyes: Negative.   Respiratory: Negative.   Cardiovascular: Negative.   Gastrointestinal: Negative.   Genitourinary: Negative.   Musculoskeletal: Negative.   Skin: Negative.   Neurological: Negative.   Endo/Heme/Allergies: Negative.   Psychiatric/Behavioral: Negative.      Physical Exam:  weight is 123 lb (55.8 kg). His temporal temperature is 97.1 F (36.2 C) (abnormal). His blood pressure is 123/95 (abnormal) and his pulse is 68. His respiration is 18  and oxygen saturation is 100%.   Physical Exam Vitals reviewed.  HENT:     Head: Normocephalic and atraumatic.  Eyes:     Pupils: Pupils are equal, round, and reactive to light.  Cardiovascular:     Rate and Rhythm: Normal rate and regular rhythm.     Heart sounds: Normal heart sounds.  Pulmonary:     Effort: Pulmonary effort is normal.     Breath sounds: Normal breath sounds.  Abdominal:     General: Bowel sounds are normal.     Palpations: Abdomen is soft.  Musculoskeletal:        General: No tenderness or deformity. Normal range of motion.     Cervical back: Normal range of motion.  Lymphadenopathy:     Cervical: No cervical adenopathy.  Skin:    General: Skin is warm and dry.     Findings: No erythema or rash.  Neurological:     Mental Status: He is alert and oriented to person, place, and time.  Psychiatric:        Behavior: Behavior normal.        Thought Content: Thought content normal.        Judgment: Judgment normal.      Lab Results  Component Value Date   WBC 4.3 09/18/2019   HGB 13.0 09/18/2019   HCT 39.4 09/18/2019   MCV 90.8 09/18/2019   PLT 130 (L) 09/18/2019  Chemistry      Component Value Date/Time   NA 141 09/18/2019 1031   NA 147 (H) 08/06/2017 0816   NA 144 02/04/2017 0758   K 4.4 09/18/2019 1031   K 4.6 08/06/2017 0816   K 4.6 02/04/2017 0758   CL 109 09/18/2019 1031   CL 104 08/06/2017 0816   CO2 27 09/18/2019 1031   CO2 30 08/06/2017 0816   CO2 25 02/04/2017 0758   BUN 14 09/18/2019 1031   BUN 12 08/06/2017 0816   BUN 14.2 02/04/2017 0758   CREATININE 1.80 (H) 09/18/2019 1031   CREATININE 0.9 08/06/2017 0816   CREATININE 0.9 02/04/2017 0758      Component Value Date/Time   CALCIUM 9.1 09/18/2019 1031   CALCIUM 9.0 08/06/2017 0816   CALCIUM 8.9 02/04/2017 0758   ALKPHOS 114 09/18/2019 1031   ALKPHOS 108 (H) 08/06/2017 0816   ALKPHOS 113 02/04/2017 0758   AST 19 09/18/2019 1031   AST 24 02/04/2017 0758   ALT 17  09/18/2019 1031   ALT 27 08/06/2017 0816   ALT 20 02/04/2017 0758   BILITOT 0.6 09/18/2019 1031   BILITOT 0.91 02/04/2017 0758         Impression and Plan: Mr. Callanan is 49 year old [place] gentleman. He has a locally advanced small bowel cancer. He underwent resection. He has 9 positive lymph nodes. As such, he is at risk for recurrence. He underwent aggressive adjuvant therapy with both chemotherapy and radiation therapy. He completed his back in October 2015.  I certainly can understand his concern, again, with the 9+ lymph nodes, this is certainly a concern.  I know that it has been 5 years now.  However, there is noise that possibility of recurrence.  We will have to get him over to urology for this hydronephrosis.  The lymph nodes, even though subcentimeter, will have to be followed.  They seem to be retroperitoneal on the left side.  This might be related to this left hydronephrosis.  I will repeat a CT scan on him probably in about 3 months.  I spent about 30 minutes with him today.  I had to go over the CT scan report.  Had a speak with to the urologist.  All the time was spent face-to-face outside of 10 minutes I spoke to the urologist.  We will see him back in 3 months and have the CT scan done the same day.     Volanda Napoleon, MD 1/11/202111:37 AM

## 2019-09-19 LAB — IRON AND TIBC
Iron: 95 ug/dL (ref 42–163)
Saturation Ratios: 29 % (ref 20–55)
TIBC: 331 ug/dL (ref 202–409)
UIBC: 237 ug/dL (ref 117–376)

## 2019-09-19 LAB — FERRITIN: Ferritin: 85 ng/mL (ref 24–336)

## 2019-09-21 ENCOUNTER — Other Ambulatory Visit: Payer: Self-pay | Admitting: Family

## 2019-09-21 ENCOUNTER — Telehealth: Payer: Self-pay | Admitting: *Deleted

## 2019-09-21 DIAGNOSIS — N1330 Unspecified hydronephrosis: Secondary | ICD-10-CM

## 2019-09-21 DIAGNOSIS — C178 Malignant neoplasm of overlapping sites of small intestine: Secondary | ICD-10-CM

## 2019-09-21 NOTE — Telephone Encounter (Signed)
Call received from patient to inform Dr. Marin Olp that he has not heard from Urology office.  Dr. Marin Olp notified.  Urology consult sent via Epic.  Patient notified that referral has been sent and that Dr. Marin Olp has spoken with Dr. Diona Fanti regarding referral.

## 2019-10-02 ENCOUNTER — Encounter: Payer: Self-pay | Admitting: Family Medicine

## 2019-10-02 ENCOUNTER — Telehealth (INDEPENDENT_AMBULATORY_CARE_PROVIDER_SITE_OTHER): Payer: Managed Care, Other (non HMO) | Admitting: Family Medicine

## 2019-10-02 DIAGNOSIS — I10 Essential (primary) hypertension: Secondary | ICD-10-CM | POA: Diagnosis not present

## 2019-10-02 DIAGNOSIS — N289 Disorder of kidney and ureter, unspecified: Secondary | ICD-10-CM

## 2019-10-02 DIAGNOSIS — R1012 Left upper quadrant pain: Secondary | ICD-10-CM | POA: Diagnosis not present

## 2019-10-02 DIAGNOSIS — R918 Other nonspecific abnormal finding of lung field: Secondary | ICD-10-CM

## 2019-10-02 DIAGNOSIS — N2889 Other specified disorders of kidney and ureter: Secondary | ICD-10-CM | POA: Diagnosis not present

## 2019-10-02 NOTE — Progress Notes (Signed)
Virtual Visit via Video Note   I connected with Mr Sittner on 10/02/19 by a video enabled telemedicine application and verified that I am speaking with the correct person using two identifiers.  Location patient: home Location provider:work office Persons participating in the virtual visit: patient, provider  I discussed the limitations of evaluation and management by telemedicine and the availability of in person appointments. The patient expressed understanding and agreed to proceed.   HPI: Mr Laplume is a 49 yo male with Hx of small intestine adenocarcinoma stage IIIc c/o a week of LUQ pain,points to left rib cage. Not radiated,constant, cannot describe type of pain but is not sharp, 4-5/10. Pain has improved,now pain is "0.5/10." Exacerbating by deeps palpation and deep breathing.  Not sure about alleviating factors. It is not affected by food intake.  Negative for fever,chills,dysphagia,odynophagia,cough,wheezing,CP,SOB, nausea, vomiting, changes in bowel habits, blood in stool or melena.  Last colonoscopy around 2020.  He is also concerned about findings reported in his last abdominal CT, 08/15/20. He states that an appointment with urologist has been arranged for 10/16/2019. Negative for dysuria, gross hematuria, or decreased urine output.  Adrenals/Urinary Tract: Adrenal glands and right kidney are unremarkable. Right ureter is decompressed. There is a striated appearance of the left kidney on nephrographic phase imaging, without excretion of contrast. Moderate left hydronephrosis. There is a hyperattenuating lesion associated with the distal left ureter, measuring approximately 9 mm (2/114 and coronal series 5, image 66), worrisome for an obstructing lesion. Bladder is low in volume.  Chest CT :  Lungs/Pleura: 2 mm nodule in the posterior left lower lobe (4/115), stable. 4 mm ground-glass nodule in the apical segment right upper lobe (4/30), unchanged as well. No pleural fluid. Airway  is unremarkable.  Noted elevated BP's during prior visits. Hx of HTN. On non pharmacologic treatment. He is not checking BP at home.  ROS: See pertinent positives and negatives per HPI.  Past Medical History:  Diagnosis Date  . Cancer of small intestine, including duodenum ([place]) 10/26/2013  . CHF (congestive heart failure) ([place])   . Chronic hepatitis B ([place])   . Diabetes mellitus without complication ([place])   . GERD (gastroesophageal reflux disease)   . Hypertension   . Impaired fasting glucose   . Radiation 01/31/14-03/06/14   upper abdomen/surgical bed    Past Surgical History:  Procedure Laterality Date  . ABDOMINAL EXPLORATION SURGERY  09/19/2013   done in [place]  . APPENDECTOMY  1997  . PANCREATICODUODENECTOMY  09/19/2013  . PARTIAL GASTRECTOMY  09/19/2013    Family History  Problem Relation Age of Onset  . Cancer Mother        Ureter Cancer  . Hypertension Unknown   . Colon cancer Sister 37  . Cancer Sister 20       colon  . Esophageal cancer Father 85  . Cancer Father        thyroid    Social History   Socioeconomic History  . Marital status: Married    Spouse name: [name]   . Number of children: 2  . Years of education: 33  . Highest education level: Not on file  Occupational History  . Occupation: TECHNICAL SUPPORT     Employer: [employer]    Comment: [employer]   Tobacco Use  . Smoking status: Former Smoker    Packs/day: 0.25    Years: 25.00    Pack years: 6.25    Types: Cigarettes    Start date: 09/07/1986  Quit date: 09/13/2013    Years since quitting: 6.0  . Smokeless tobacco: Never Used  . Tobacco comment: quit January of 2015  Substance and Sexual Activity  . Alcohol use: No    Alcohol/week: 0.0 standard drinks    Comment: He drinks rarely   . Drug use: No  . Sexual activity: Yes    Birth control/protection: Surgical    Comment: Wife had a BTL  Other Topics Concern  . 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  He has smoked at least 20 years.     Alcohol Use:  Rarely    Exercise:  Limited    Hobbies: Music            Social Determinants of Health   Financial Resource Strain:   . Difficulty of Paying Living Expenses: Not on file  Food Insecurity:   . Worried About Charity fundraiser in the Last Year: Not on file  . Ran Out of Food in the Last Year: Not on file  Transportation Needs:   . Lack of Transportation (Medical): Not on file  . Lack of Transportation (Non-Medical): Not on file  Physical Activity:   . Days of Exercise per Week: Not on file  . Minutes of Exercise per Session: Not on file  Stress:   . Feeling of Stress : Not on file  Social Connections:   . Frequency of Communication with Friends and Family: Not on file  . Frequency of Social Gatherings with Friends and Family: Not on file  . Attends Religious Services: Not on file  . Active Member of Clubs or Organizations: Not on file  . Attends Archivist Meetings: Not on file  . Marital Status: Not on file  Intimate Partner Violence:   . Fear of Current or Ex-Partner: Not on file  . Emotionally Abused: Not on file  . Physically Abused: Not on file  . Sexually Abused: Not on file    Current Outpatient Medications:  .  CREON 36000 units CPEP capsule, TAKE 1 CAPSULE BY MOUTH THREE TIMES DAILY WITH MEALS, Disp: 90 capsule, Rfl: 0  EXAM:  VITALS per patient if applicable:N/A  GENERAL: alert, oriented, appears well and in no acute distress  HEENT: atraumatic, conjunctiva clear, no obvious abnormalities on inspection.  NECK: normal movements of the head and neck  LUNGS: on inspection no signs of respiratory distress, breathing  rate appears normal, no obvious gross SOB, gasping or wheezing  CV: no obvious cyanosis  MS: moves all visible extremities without noticeable abnormality  PSYCH/NEURO: pleasant and cooperative, no obvious depression or anxiety, speech and thought processing grossly intact  ASSESSMENT AND PLAN:  Discussed the following assessment and plan:  Abdominal pain, LUQ Greatly improved. ? Musculoskeletal. I do not think further studies are needed at this time. Instructed about warning signs.  Lesion of left native ureter We reviewed abdominal CT report,discussed findings as well as prognosis, and treatment options. Keep appointment with urologist.  Lung nodules Small and seem to be stable when compared with prior imaging.  Essential hypertension, benign Mildly elevated. Recommend monitoring BP regularly.   I discussed the assessment and treatment  plan with the patient. Mr Giuliani was provided an opportunity to ask questions and all were answered. She agreed with the plan and demonstrated an understanding of the instructions.   He was advised to call back or seek an in-person evaluation if the symptoms worsen or if the condition fails to improve as anticipated.  Return if symptoms worsen or fail to improve.    Asher Babilonia Martinique, MD

## 2019-10-18 ENCOUNTER — Other Ambulatory Visit: Payer: Self-pay | Admitting: Urology

## 2019-10-23 ENCOUNTER — Other Ambulatory Visit (HOSPITAL_COMMUNITY)
Admission: RE | Admit: 2019-10-23 | Discharge: 2019-10-23 | Disposition: A | Discharge status: Home / Self Care | Payer: Managed Care, Other (non HMO) | Source: Ambulatory Visit | Attending: Urology | Admitting: Urology

## 2019-10-23 ENCOUNTER — Encounter (HOSPITAL_COMMUNITY): Payer: Self-pay

## 2019-10-23 DIAGNOSIS — Z20822 Contact with and (suspected) exposure to covid-19: Secondary | ICD-10-CM | POA: Insufficient documentation

## 2019-10-23 DIAGNOSIS — Z01812 Encounter for preprocedural laboratory examination: Secondary | ICD-10-CM | POA: Insufficient documentation

## 2019-10-23 LAB — SARS CORONAVIRUS 2 (TAT 6-24 HRS): SARS Coronavirus 2: NEGATIVE

## 2019-10-23 NOTE — Patient Instructions (Signed)
DUE TO COVID-19 ONLY ONE VISITOR IS ALLOWED TO COME WITH YOU AND STAY IN THE WAITING ROOM ONLY DURING PRE OP AND PROCEDURE. THE ONE VISITOR MAY VISIT WITH YOU IN YOUR PRIVATE ROOM DURING VISITING HOURS ONLY!!   COVID SWAB TESTING COMPLETED ON:   Monday, Feb. 15, 2021   (Must self quarantine after testing. Follow instructions on handout.)             Your procedure is scheduled on: Thursday, Feb. 18, 2021   Report to [facility] Main  Entrance    Report to admitting at 1:15 PM   Call this number if you have problems the morning of surgery [phone]   Do not eat food:After Midnight.   May have liquids until 9:15 AM day of surgery   CLEAR LIQUID DIET  Foods Allowed                                                                     Foods Excluded  Water, Black Coffee and tea, regular and decaf                             liquids that you cannot  Plain Jell-O in any flavor  (No red)                                           see through such as: Fruit ices (not with fruit pulp)                                     milk, soups, orange juice  Iced Popsicles (No red)                                    All solid food Carbonated beverages, regular and diet                                    Apple juices Sports drinks like Gatorade (No red) Lightly seasoned clear broth or consume(fat free) Sugar, honey syrup  Sample Menu Breakfast                                Lunch                                     Supper Cranberry juice                    Beef broth                            Chicken broth Jell-O  Grape juice                           Apple juice Coffee or tea                        Jell-O                                      Popsicle                                                Coffee or tea                        Coffee or tea    Oral Hygiene is also important to reduce your risk of infection.                                     Remember - BRUSH YOUR TEETH THE MORNING OF SURGERY WITH YOUR REGULAR TOOTHPASTE   Do NOT smoke after Midnight   Take these medicines the morning of surgery with A SIP OF WATER: None  DO NOT TAKE ANY DIABETIC MEDICATIONS DAY OF YOUR SURGERY                               You may not have any metal on your body including jewelry, and body piercings             Do not wear lotions, powders, perfumes/cologne, or deodorant                          Men may shave face and neck.   Do not bring valuables to the hospital. [facility].   Contacts, dentures or bridgework may not be worn into surgery.    Patients discharged the day of surgery will not be allowed to drive home.   Special Instructions: Bring a copy of your healthcare power of attorney and living will documents         the day of surgery if you haven't scanned them in before.              Please read over the following fact sheets you were given: How to Manage Your Diabetes Before and After Surgery  Why is it important to control my blood sugar before and after surgery? . Improving blood sugar levels before and after surgery helps healing and can limit problems. . A way of improving blood sugar control is eating a healthy diet by: o  Eating less sugar and carbohydrates o  Increasing activity/exercise o  Talking with your doctor about reaching your blood sugar goals . High blood sugars (greater than 180 mg/dL) can raise your risk of infections and slow your recovery, so you will need to focus on controlling your diabetes during the weeks before surgery. . Make sure that the doctor who takes care of your diabetes knows  about your planned surgery including the date and location.  How do I manage my blood sugar before surgery? . Check your blood sugar at least 4 times a day, starting 2 days before surgery, to make sure that the level is not too high or low. o Check your blood sugar the  morning of your surgery when you wake up and every 2 hours until you get to the Short Stay unit. . If your blood sugar is less than 70 mg/dL, you will need to treat for low blood sugar: o Do not take insulin. o Treat a low blood sugar (less than 70 mg/dL) with  cup of clear juice (cranberry or apple), 4 glucose tablets, OR glucose gel. o Recheck blood sugar in 15 minutes after treatment (to make sure it is greater than 70 mg/dL). If your blood sugar is not greater than 70 mg/dL on recheck, call [phone] for further instructions. . Report your blood sugar to the short stay nurse when you get to Short Stay.  . If you are admitted to the hospital after surgery: o Your blood sugar will be checked by the staff and you will probably be given insulin after surgery (instead of oral diabetes medicines) to make sure you have good blood sugar levels. o The goal for blood sugar control after surgery is 80-180 mg/dL.   WHAT DO I DO ABOUT MY DIABETES MEDICATION?  [name] Do not take oral diabetes medicines (pills) the morning of surgery.  Reviewed and Endorsed by [facility] Patient Education Committee, August 2015  [facility] - Preparing for Surgery Before surgery, you can play an important role.  Because skin is not sterile, your skin needs to be as free of germs as possible.  You can reduce the number of germs on your skin by washing with CHG (chlorahexidine gluconate) soap before surgery.  CHG is an antiseptic cleaner which kills germs and bonds with the skin to continue killing germs even after washing. Please DO NOT use if you have an allergy to CHG or antibacterial soaps.  If your skin becomes reddened/irritated stop using the CHG and inform your nurse when you arrive at Short Stay. Do not shave (including legs and underarms) for at least 48 hours prior to the first CHG shower.  You may shave your face/neck.  Please follow these instructions carefully:  1.  Shower with CHG Soap the night before  surgery and the  morning of surgery.  2.  If you choose to wash your hair, wash your hair first as usual with your normal  shampoo.  3.  After you shampoo, rinse your hair and body thoroughly to remove the shampoo.                             4.  Use CHG as you would any other liquid soap.  You can apply chg directly to the skin and wash.  Gently with a scrungie or clean washcloth.  5.  Apply the CHG Soap to your body ONLY FROM THE NECK DOWN.   Do   not use on face/ open                           Wound or open sores. Avoid contact with eyes, ears mouth and   genitals (private parts).  Wash face,  Genitals (private parts) with your normal soap.             6.  Wash thoroughly, paying special attention to the area where your    surgery  will be performed.  7.  Thoroughly rinse your body with warm water from the neck down.  8.  DO NOT shower/wash with your normal soap after using and rinsing off the CHG Soap.                9.  Pat yourself dry with a clean towel.            10.  Wear clean pajamas.            11.  Place clean sheets on your bed the night of your first shower and do not  sleep with pets. Day of Surgery : Do not apply any lotions/deodorants the morning of surgery.  Please wear clean clothes to the hospital/surgery center.  FAILURE TO FOLLOW THESE INSTRUCTIONS MAY RESULT IN THE CANCELLATION OF YOUR SURGERY  PATIENT SIGNATURE_________________________________  NURSE SIGNATURE__________________________________  ________________________________________________________________________

## 2019-10-24 ENCOUNTER — Encounter (HOSPITAL_COMMUNITY)
Admission: RE | Admit: 2019-10-24 | Discharge: 2019-10-24 | Disposition: A | Discharge status: Home / Self Care | Payer: Managed Care, Other (non HMO) | Source: Ambulatory Visit | Attending: Urology | Admitting: Urology

## 2019-10-24 ENCOUNTER — Other Ambulatory Visit: Payer: Self-pay

## 2019-10-24 ENCOUNTER — Encounter (HOSPITAL_COMMUNITY): Payer: Self-pay

## 2019-10-24 DIAGNOSIS — Z01818 Encounter for other preprocedural examination: Secondary | ICD-10-CM | POA: Diagnosis present

## 2019-10-24 HISTORY — DX: Unspecified hydronephrosis: N13.30

## 2019-10-24 HISTORY — DX: Thrombocytopenia, unspecified: D69.6

## 2019-10-24 HISTORY — DX: Other nonspecific abnormal finding of lung field: R91.8

## 2019-10-24 LAB — COMPREHENSIVE METABOLIC PANEL
ALT: 22 U/L (ref 0–44)
AST: 24 U/L (ref 15–41)
Albumin: 3.9 g/dL (ref 3.5–5.0)
Alkaline Phosphatase: 105 U/L (ref 38–126)
Anion gap: 6 (ref 5–15)
BUN: 13 mg/dL (ref 6–20)
CO2: 26 mmol/L (ref 22–32)
Calcium: 8.8 mg/dL — ABNORMAL LOW (ref 8.9–10.3)
Chloride: 108 mmol/L (ref 98–111)
Creatinine, Ser: 1.64 mg/dL — ABNORMAL HIGH (ref 0.61–1.24)
GFR calc Af Amer: 56 mL/min — ABNORMAL LOW (ref >60)
GFR calc non Af Amer: 48 mL/min — ABNORMAL LOW (ref >60)
Glucose, Bld: 112 mg/dL — ABNORMAL HIGH (ref 70–99)
Potassium: 4.2 mmol/L (ref 3.5–5.1)
Sodium: 140 mmol/L (ref 135–145)
Total Bilirubin: 0.9 mg/dL (ref 0.3–1.2)
Total Protein: 6.7 g/dL (ref 6.5–8.1)

## 2019-10-24 LAB — CBC
HCT: 42.1 % (ref 39.0–52.0)
Hemoglobin: 13.6 g/dL (ref 13.0–17.0)
MCH: 30.1 pg (ref 26.0–34.0)
MCHC: 32.3 g/dL (ref 30.0–36.0)
MCV: 93.1 fL (ref 80.0–100.0)
Platelets: 126 10*3/uL — ABNORMAL LOW (ref 150–400)
RBC: 4.52 MIL/uL (ref 4.22–5.81)
RDW: 13.4 % (ref 11.5–15.5)
WBC: 4.2 10*3/uL (ref 4.0–10.5)
nRBC: 0 % (ref 0.0–0.2)

## 2019-10-24 LAB — HEMOGLOBIN A1C
Hgb A1c MFr Bld: 5.9 % — ABNORMAL HIGH (ref 4.8–5.6)
Mean Plasma Glucose: 122.63 mg/dL

## 2019-10-24 LAB — GLUCOSE, CAPILLARY: Glucose-Capillary: 96 mg/dL (ref 70–99)

## 2019-10-24 NOTE — Progress Notes (Signed)
PCP -  Dr. B. Martinique Cardiologist - N/A  Chest x-ray - N/A EKG - 10/24/2019 in epic Stress Test - N/A ECHO - greater than 2 years Cardiac Cath - N/A  Sleep Study - N/A CPAP - N/A  Fasting Blood Sugar - N/A (patient denies having DM) Checks Blood Sugar __N/A___ times a day  Blood Thinner Instructions: N/A Aspirin Instructions:N/A Last Dose:N/A  Anesthesia review: Chronic Hep. B, DM (patient denies) CHF (patient denies) HTN (does not take any medications)  Patient denies shortness of breath, fever, cough and chest pain at PAT appointment   Patient verbalized understanding of instructions that were given to them at the PAT appointment. Patient was also instructed that they will need to review over the PAT instructions again at home before surgery.

## 2019-10-26 ENCOUNTER — Ambulatory Visit (HOSPITAL_COMMUNITY)
Admission: RE | Admit: 2019-10-26 | Discharge: 2019-10-26 | Disposition: A | Discharge status: Home / Self Care | Payer: Managed Care, Other (non HMO) | Source: Ambulatory Visit | Attending: Urology | Admitting: Urology

## 2019-10-26 ENCOUNTER — Ambulatory Visit (HOSPITAL_COMMUNITY): Payer: Managed Care, Other (non HMO) | Admitting: Physician Assistant

## 2019-10-26 ENCOUNTER — Ambulatory Visit (HOSPITAL_COMMUNITY): Payer: Managed Care, Other (non HMO)

## 2019-10-26 ENCOUNTER — Encounter (HOSPITAL_COMMUNITY)
Admission: RE | Disposition: A | Discharge status: Home / Self Care | Payer: Self-pay | Source: Ambulatory Visit | Attending: Urology

## 2019-10-26 ENCOUNTER — Telehealth (HOSPITAL_COMMUNITY): Payer: Self-pay | Admitting: *Deleted

## 2019-10-26 ENCOUNTER — Ambulatory Visit (HOSPITAL_COMMUNITY): Payer: Managed Care, Other (non HMO) | Admitting: Certified Registered"

## 2019-10-26 ENCOUNTER — Encounter (HOSPITAL_COMMUNITY): Payer: Self-pay | Admitting: Urology

## 2019-10-26 DIAGNOSIS — Z903 Acquired absence of stomach [part of]: Secondary | ICD-10-CM | POA: Insufficient documentation

## 2019-10-26 DIAGNOSIS — C170 Malignant neoplasm of duodenum: Secondary | ICD-10-CM | POA: Diagnosis not present

## 2019-10-26 DIAGNOSIS — Z923 Personal history of irradiation: Secondary | ICD-10-CM | POA: Insufficient documentation

## 2019-10-26 DIAGNOSIS — D696 Thrombocytopenia, unspecified: Secondary | ICD-10-CM | POA: Diagnosis not present

## 2019-10-26 DIAGNOSIS — Z980 Intestinal bypass and anastomosis status: Secondary | ICD-10-CM | POA: Insufficient documentation

## 2019-10-26 DIAGNOSIS — Z87891 Personal history of nicotine dependence: Secondary | ICD-10-CM | POA: Diagnosis not present

## 2019-10-26 DIAGNOSIS — N131 Hydronephrosis with ureteral stricture, not elsewhere classified: Secondary | ICD-10-CM | POA: Insufficient documentation

## 2019-10-26 DIAGNOSIS — R918 Other nonspecific abnormal finding of lung field: Secondary | ICD-10-CM | POA: Insufficient documentation

## 2019-10-26 DIAGNOSIS — I509 Heart failure, unspecified: Secondary | ICD-10-CM | POA: Diagnosis not present

## 2019-10-26 DIAGNOSIS — Z8349 Family history of other endocrine, nutritional and metabolic diseases: Secondary | ICD-10-CM | POA: Insufficient documentation

## 2019-10-26 DIAGNOSIS — E119 Type 2 diabetes mellitus without complications: Secondary | ICD-10-CM | POA: Diagnosis not present

## 2019-10-26 DIAGNOSIS — B181 Chronic viral hepatitis B without delta-agent: Secondary | ICD-10-CM | POA: Insufficient documentation

## 2019-10-26 DIAGNOSIS — Z808 Family history of malignant neoplasm of other organs or systems: Secondary | ICD-10-CM | POA: Diagnosis not present

## 2019-10-26 DIAGNOSIS — Z8049 Family history of malignant neoplasm of other genital organs: Secondary | ICD-10-CM | POA: Diagnosis not present

## 2019-10-26 DIAGNOSIS — K219 Gastro-esophageal reflux disease without esophagitis: Secondary | ICD-10-CM | POA: Diagnosis not present

## 2019-10-26 DIAGNOSIS — I110 Hypertensive heart disease with heart failure: Secondary | ICD-10-CM | POA: Diagnosis not present

## 2019-10-26 DIAGNOSIS — Z8249 Family history of ischemic heart disease and other diseases of the circulatory system: Secondary | ICD-10-CM | POA: Insufficient documentation

## 2019-10-26 DIAGNOSIS — Z800 Family history of malignant neoplasm of digestive organs: Secondary | ICD-10-CM | POA: Diagnosis not present

## 2019-10-26 HISTORY — PX: CYSTOSCOPY WITH RETROGRADE PYELOGRAM, URETEROSCOPY AND STENT PLACEMENT: SHX5789

## 2019-10-26 SURGERY — CYSTOURETEROSCOPY, WITH RETROGRADE PYELOGRAM AND STENT INSERTION
Anesthesia: General | Laterality: Left

## 2019-10-26 MED ORDER — ONDANSETRON HCL 4 MG/2ML IJ SOLN
INTRAMUSCULAR | Status: AC
  Filled 2019-10-26: qty 2

## 2019-10-26 MED ORDER — CEFAZOLIN SODIUM-DEXTROSE 2-4 GM/100ML-% IV SOLN
INTRAVENOUS | Status: AC
  Filled 2019-10-26: qty 100

## 2019-10-26 MED ORDER — MIDAZOLAM HCL 2 MG/2ML IJ SOLN
INTRAMUSCULAR | Status: AC
  Filled 2019-10-26: qty 2

## 2019-10-26 MED ORDER — OXYCODONE HCL 5 MG PO TABS: 5.0000 mg | ORAL_TABLET | Freq: Once | ORAL | Status: DC | PRN

## 2019-10-26 MED ORDER — PROMETHAZINE HCL 25 MG/ML IJ SOLN: 6.2500 mg | INTRAMUSCULAR | Status: DC | PRN

## 2019-10-26 MED ORDER — SODIUM CHLORIDE 0.9 % IR SOLN
Status: DC | PRN
  Administered 2019-10-26: 3000 mL

## 2019-10-26 MED ORDER — ONDANSETRON HCL 4 MG/2ML IJ SOLN
INTRAMUSCULAR | Status: DC | PRN
  Administered 2019-10-26: 4 mg via INTRAVENOUS

## 2019-10-26 MED ORDER — DEXAMETHASONE SODIUM PHOSPHATE 10 MG/ML IJ SOLN
INTRAMUSCULAR | Status: DC | PRN
  Administered 2019-10-26: 10 mg via INTRAVENOUS

## 2019-10-26 MED ORDER — PROPOFOL 10 MG/ML IV BOLUS
INTRAVENOUS | Status: DC | PRN
  Administered 2019-10-26: 110 mg via INTRAVENOUS

## 2019-10-26 MED ORDER — PROPOFOL 10 MG/ML IV BOLUS
INTRAVENOUS | Status: AC
  Filled 2019-10-26: qty 20

## 2019-10-26 MED ORDER — LACTATED RINGERS IV SOLN: INTRAVENOUS | Status: DC

## 2019-10-26 MED ORDER — IOHEXOL 300 MG/ML  SOLN
INTRAMUSCULAR | Status: DC | PRN
  Administered 2019-10-26: 20 mL via URETHRAL

## 2019-10-26 MED ORDER — ACETAMINOPHEN 500 MG PO TABS: 1000.0000 mg | ORAL_TABLET | Freq: Once | ORAL | Status: DC

## 2019-10-26 MED ORDER — DEXAMETHASONE SODIUM PHOSPHATE 10 MG/ML IJ SOLN
INTRAMUSCULAR | Status: AC
  Filled 2019-10-26: qty 1

## 2019-10-26 MED ORDER — OXYCODONE HCL 5 MG/5ML PO SOLN: 5.0000 mg | Freq: Once | ORAL | Status: DC | PRN

## 2019-10-26 MED ORDER — LIDOCAINE 2% (20 MG/ML) 5 ML SYRINGE
INTRAMUSCULAR | Status: DC | PRN
  Administered 2019-10-26: 60 mg via INTRAVENOUS

## 2019-10-26 MED ORDER — MIDAZOLAM HCL 2 MG/2ML IJ SOLN
INTRAMUSCULAR | Status: DC | PRN
  Administered 2019-10-26: 2 mg via INTRAVENOUS

## 2019-10-26 MED ORDER — FENTANYL CITRATE (PF) 100 MCG/2ML IJ SOLN: 25.0000 ug | INTRAMUSCULAR | Status: DC | PRN

## 2019-10-26 MED ORDER — CEPHALEXIN 500 MG PO CAPS: 500.0000 mg | ORAL_CAPSULE | Freq: Two times a day (BID) | ORAL | 0 refills | Status: DC

## 2019-10-26 MED ORDER — CEFAZOLIN SODIUM-DEXTROSE 2-4 GM/100ML-% IV SOLN
2.0000 g | INTRAVENOUS | Status: AC
  Administered 2019-10-26: 2 g via INTRAVENOUS

## 2019-10-26 MED ORDER — FENTANYL CITRATE (PF) 100 MCG/2ML IJ SOLN
INTRAMUSCULAR | Status: AC
  Filled 2019-10-26: qty 2

## 2019-10-26 MED ORDER — FENTANYL CITRATE (PF) 100 MCG/2ML IJ SOLN
INTRAMUSCULAR | Status: DC | PRN
  Administered 2019-10-26: 50 ug via INTRAVENOUS

## 2019-10-26 SURGICAL SUPPLY — 27 items
BAG URO CATCHER STRL LF (MISCELLANEOUS) ×3 IMPLANT
BASKET LASER NITINOL 1.9FR (BASKET) IMPLANT
BASKET ZERO TIP NITINOL 2.4FR (BASKET) IMPLANT
CATH INTERMIT  6FR 70CM (CATHETERS) ×3 IMPLANT
CATH URET 5FR 28IN OPEN ENDED (CATHETERS) ×3 IMPLANT
CLOTH BEACON ORANGE TIMEOUT ST (SAFETY) ×3 IMPLANT
COVER SURGICAL LIGHT HANDLE (MISCELLANEOUS) ×3 IMPLANT
COVER WAND RF STERILE (DRAPES) IMPLANT
DRSG TELFA 3X8 NADH (GAUZE/BANDAGES/DRESSINGS) ×3 IMPLANT
FIBER LASER FLEXIVA 365 (UROLOGICAL SUPPLIES) IMPLANT
FIBER LASER TRAC TIP (UROLOGICAL SUPPLIES) IMPLANT
GLOVE BIOGEL M 8.0 STRL (GLOVE) ×3 IMPLANT
GOWN STRL REUS W/TWL XL LVL3 (GOWN DISPOSABLE) ×3 IMPLANT
GUIDEWIRE ANG ZIPWIRE 038X150 (WIRE) ×3 IMPLANT
GUIDEWIRE STR DUAL SENSOR (WIRE) ×3 IMPLANT
IV NS 1000ML (IV SOLUTION) ×2
IV NS 1000ML BAXH (IV SOLUTION) ×1 IMPLANT
KIT TURNOVER KIT A (KITS) IMPLANT
MANIFOLD NEPTUNE II (INSTRUMENTS) ×3 IMPLANT
NDL SAFETY ECLIPSE 18X1.5 (NEEDLE) ×1 IMPLANT
NEEDLE HYPO 18GX1.5 SHARP (NEEDLE) ×2
PACK CYSTO (CUSTOM PROCEDURE TRAY) ×3 IMPLANT
PENCIL SMOKE EVACUATOR (MISCELLANEOUS) IMPLANT
SYR CONTROL 10ML LL (SYRINGE) ×3 IMPLANT
TUBING CONNECTING 10 (TUBING) ×2 IMPLANT
TUBING CONNECTING 10' (TUBING) ×1
TUBING UROLOGY SET (TUBING) ×3 IMPLANT

## 2019-10-26 NOTE — Anesthesia Procedure Notes (Signed)
Procedure Name: LMA Insertion Date/Time: 10/26/2019 1:33 PM Performed by: [place] Cliford Sequeira C, CRNA Pre-anesthesia Checklist: Patient identified, Emergency Drugs available, Suction available and Patient being monitored Patient Re-evaluated:Patient Re-evaluated prior to induction Oxygen Delivery Method: Circle system utilized Preoxygenation: Pre-oxygenation with 100% oxygen Induction Type: IV induction Ventilation: Mask ventilation without difficulty LMA: LMA inserted LMA Size: 4.0 Number of attempts: 1 Airway Equipment and Method: Bite block Placement Confirmation: positive ETCO2 Tube secured with: Tape Dental Injury: Teeth and Oropharynx as per pre-operative assessment

## 2019-10-26 NOTE — Transfer of Care (Signed)
Immediate Anesthesia Transfer of Care Note  Patient: Keith [name]  Procedure(s) Performed: CYSTOSCOPY WITH LEFT RETROGRADE PYELOGRAM, URETEROSCOPY (Left )  Patient Location: PACU  Anesthesia Type:General  Level of Consciousness: awake, alert  and oriented  Airway & Oxygen Therapy: Patient Spontanous Breathing and Patient connected to face mask oxygen  Post-op Assessment: Report given to RN and Post -op Vital signs reviewed and stable  Post vital signs: Reviewed and stable  Last Vitals:  Vitals Value Taken Time  BP    Temp    Pulse 52 10/26/19 1428  Resp 12 10/26/19 1428  SpO2 100 % 10/26/19 1428  Vitals shown include unvalidated device data.  Last Pain:  Vitals:   10/26/19 1123  TempSrc: Oral         Complications: No apparent anesthesia complications

## 2019-10-26 NOTE — Progress Notes (Signed)
Pt does not need to void prior to discharge per Dr Diona Fanti

## 2019-10-26 NOTE — Discharge Instructions (Signed)

## 2019-10-26 NOTE — Anesthesia Preprocedure Evaluation (Addendum)
Anesthesia Evaluation  Patient identified by MRN, date of birth, ID band Patient awake    Reviewed: Allergy & Precautions, NPO status , Patient's Chart, lab work & pertinent test results  History of Anesthesia Complications Negative for: history of anesthetic complications  Airway Mallampati: I  TM Distance: >3 FB Neck ROM: Full    Dental no notable dental hx.    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  Reproductive/Obstetrics negative OB ROS                            Anesthesia Physical Anesthesia Plan  ASA: II  Anesthesia Plan: General   Post-op Pain Management:    Induction: Intravenous  PONV Risk Score and Plan: 2 and Treatment may vary due to age or medical condition, Ondansetron and Midazolam  Airway Management Planned: LMA  Additional Equipment: None  Intra-op Plan:   Post-operative Plan: Extubation in OR  Informed Consent: I have reviewed the patients History and Physical, chart, labs and discussed the procedure including the risks, benefits and alternatives for the proposed anesthesia with the patient or authorized representative who has indicated his/her understanding and acceptance.     Dental advisory given  Plan Discussed with: CRNA  Anesthesia Plan Comments:        Anesthesia Quick Evaluation

## 2019-10-26 NOTE — H&P (Signed)
H&P  Chief Complaint:  Left hydronephrosis, most likely secondary to recurrent intestinal carcinoma  History of Present Illness:  49 year old male presents at this time for cystoscopy, left retrograde ureteral pyelogram, left ureteroscopy, possible ureteral biopsy and placement of ureteral stent.  He is treated for recurrent small intestinal carcinoma by Dr. Marin Olp.  Recent CT scan 2 months ago revealed new onset left hydronephrosis with possible distal left ureteral lesion which enhance is with contrast.  He recently presented to the office for evaluation and management of this.  He presents at this time for ureteroscopy and management of this lesion with probable stent placement.  Past Medical History:  Diagnosis Date  . Cancer of small intestine, including duodenum ([place]) 10/26/2013   small intestine adenocarcinoma stage IIIc   . CHF (congestive heart failure) ([place])    patient denies  . Chronic hepatitis B ([place])   . Diabetes mellitus without complication ([place])    patient denies  . GERD (gastroesophageal reflux disease)    remote history  . Hydronephrosis of left kidney   . Hypertension    no medications at this time  . Impaired fasting glucose   . Lung nodules    2 mm nodule in the posterior left lower lobe (4/115), stable. 4 mm ground-glass nodule in the apical segment right upper lobe (4/30), unchanged as well.  . Platelets decreased ([place])   . Radiation 01/31/14-03/06/14   upper abdomen/surgical bed    Past Surgical History:  Procedure Laterality Date  . ABDOMINAL EXPLORATION SURGERY  09/19/2013   done in [place]  . APPENDECTOMY  1997  . BACK SURGERY     tumor excision on back  . COLONOSCOPY    . PANCREATICODUODENECTOMY  09/19/2013  . PARTIAL GASTRECTOMY  09/19/2013  . UPPER GI ENDOSCOPY      Home Medications:  Allergies as of 10/26/2019   No Known Allergies     Medication List    Notice   Cannot display discharge medications because the patient has not yet been  admitted.     Allergies: No Known Allergies  Family History  Problem Relation Age of Onset  . Cancer Mother        Ureter Cancer  . Hypertension Other   . Colon cancer Sister 41  . Cancer Sister 20       colon  . Esophageal cancer Father 69  . Cancer Father        thyroid    Social History:  reports that he quit smoking about 6 years ago. His smoking use included cigarettes. He started smoking about 33 years ago. He has a 6.25 pack-year smoking history. He has never used smokeless tobacco. He reports that he does not drink alcohol or use drugs.  ROS: A complete review of systems was performed.  All systems are negative except for pertinent findings as noted.  Physical Exam:  Vital signs in last 24 hours: There were no vitals taken for this visit. Constitutional:  Alert and oriented, No acute distress Cardiovascular: Regular rate  Respiratory: Normal respiratory effort GI: Abdomen is soft, nontender, nondistended, no abdominal masses. No CVAT.  Genitourinary: Normal male phallus, testes are descended bilaterally and non-tender and without masses, scrotum is normal in appearance without lesions or masses, perineum is normal on inspection. Lymphatic: No lymphadenopathy Neurologic: Grossly intact, no focal deficits Psychiatric: Normal mood and affect  Laboratory Data:  Recent Labs    10/24/19 1150  WBC 4.2  HGB 13.6  HCT 42.1  PLT 126*  Recent Labs    10/24/19 1150  NA 140  K 4.2  CL 108  GLUCOSE 112*  BUN 13  CALCIUM 8.8*  CREATININE 1.64*     No results found for this or any previous visit (from the past 24 hour(s)). Recent Results (from the past 240 hour(s))  SARS CORONAVIRUS 2 (TAT 6-24 HRS) Nasopharyngeal Nasopharyngeal Swab     Status: None   Collection Time: 10/23/19  8:15 AM   Specimen: Nasopharyngeal Swab  Result Value Ref Range Status   SARS Coronavirus 2 NEGATIVE NEGATIVE Final    Comment: (NOTE) SARS-CoV-2 target nucleic acids are NOT  DETECTED. The SARS-CoV-2 RNA is generally detectable in upper and lower respiratory specimens during the acute phase of infection. Negative results do not preclude SARS-CoV-2 infection, do not rule out co-infections with other pathogens, and should not be used as the sole basis for treatment or other patient management decisions. Negative results must be combined with clinical observations, patient history, and epidemiological information. The expected result is Negative. Fact Sheet for Patients: SugarRoll.be Fact Sheet for Healthcare Providers: https://www.woods-mathews.com/ This test is not yet approved or cleared by the [place] FDA and  has been authorized for detection and/or diagnosis of SARS-CoV-2 by FDA under an Emergency Use Authorization (EUA). This EUA will remain  in effect (meaning this test can be used) for the duration of the COVID-19 declaration under Section 56 4(b)(1) of the Act, 21 U.S.C. section 360bbb-3(b)(1), unless the authorization is terminated or revoked sooner. Performed at [facility], [place] [address] [place],  [address]     Renal Function: Recent Labs    10/24/19 1150  CREATININE 1.64*   Estimated Creatinine Clearance: 43 mL/min (A) (by C-G formula based on SCr of 1.64 mg/dL (H)).  Radiologic Imaging: No results found.  Impression/Assessment:    Left hydronephrosis, likely malignant  Plan:   cystoscopy, left retrograde ureteral pyelogram, left ureteroscopy, possible biopsy of left ureter, stent placement

## 2019-10-26 NOTE — Anesthesia Postprocedure Evaluation (Signed)
Anesthesia Post Note  Patient: Cheral Marker  Procedure(s) Performed: CYSTOSCOPY WITH LEFT RETROGRADE PYELOGRAM, URETEROSCOPY (Left )     Patient location during evaluation: PACU Anesthesia Type: General Level of consciousness: awake and alert and oriented Pain management: pain level controlled Vital Signs Assessment: post-procedure vital signs reviewed and stable Respiratory status: spontaneous breathing, nonlabored ventilation and respiratory function stable Cardiovascular status: blood pressure returned to baseline Postop Assessment: no apparent nausea or vomiting Anesthetic complications: no    Last Vitals:  Vitals:   10/26/19 1430 10/26/19 1445  BP: (!) 150/95 (!) 170/103  Pulse: (!) 53 (!) 58  Resp: 15 16  Temp:  36.6 C  SpO2: 100% 100%    Last Pain:  Vitals:   10/26/19 1428  TempSrc:   PainSc: 0-No pain                 Brennan Bailey

## 2019-10-26 NOTE — Op Note (Signed)
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  He has been under the care of Dr. Burney Gauze.  Recent CT scan from late 2020 revealed left hydronephrosis from a distal lesion.  He recently presented for evaluation and management.  The patient is relatively asymptomatic from this.  He presents at this time for cystoscopy, left retrograde, ureteroscopy, possible biopsy of ureteral lesion and placement of double-J stent.  I discussed the procedure with the patient.  Risks and complications have been discussed as well.  He desires to proceed.  Findings: Urethra was normal.  Prostate nonobstructive.  Urothelium of the bladder normal.  Ureteral orifices in normal location and of normal configuration.  Retrograde study was performed revealing no significant passage of contrast more than the first centimeter or 2 of the left ureter.  Ureteroscopic examination revealed an impassable mass in the left ureter approximately 2 to 3 cm from the ureterovesical junction.  Description of procedure: The patient was properly identified and marked in the holding area.  Was taken to the operating room where general anesthetic was administered with the LMA.  He was placed in the dorsolithotomy position.  Genitalia and perineum were prepped and draped.  Proper timeout was performed.  21 French panendoscope was advanced into the bladder with the above-mentioned findings.  Utilizing a 6 [place] open-ended catheter, Omnipaque was used to  perform left retrograde study.  This revealed a normal centimeter or 2 of the left distalmost ureter.  There was no egress of contrast past this, however.  I was unable to pass the open-ended catheter, even utilizing the sensor tip guidewire any farther proximally.  I then removed the cystoscope, guidewire and open-ended catheter.  I used the 6 [place] dual-lumen semirigid ureteroscope to advance into the bladder and then attempted to advance more proximally in the ureter.  About 3 cm up, there was total obliteration of the ureter from what appeared to be a masslike effect.  I used the zip wire to try to get past this, and after multiple attempts was able to pass the zip wire more proximally into the area that would most likely be the renal pelvis.  I removed the ureteroscope and replaced it with the open-ended catheter, 5 [place], over top of the zip wire.  I then removed the zip wire.  Retrograde study at this point revealed probable extravasation, with the zip wire most likely extra ureteral.  I attempted to replace the ureteroscope, and again I could not get it passed what appeared to be a ureteral mass.  At this point the scope was removed.  The bladder was drained.  The procedure was terminated.  The patient tolerated the procedure well.  He will be sent home.  I will reviewed this with him, and more than likely have him present for CT scan again to follow-up his hydronephrosis, and possible nephrostomy tube placement eventually.

## 2019-10-26 NOTE — Interval H&P Note (Signed)
History and Physical Interval Note:  10/26/2019 12:58 PM  Keith [name]  has presented today for surgery, with the diagnosis of LEFT HYDRONEPHROSIS.  The various methods of treatment have been discussed with the patient and family. After consideration of risks, benefits and other options for treatment, the patient has consented to  Procedure(s) with comments: CYSTOSCOPY WITH RETROGRADE PYELOGRAM, URETEROSCOPY AND STENT PLACEMENT (Left) - 1 HR HOLMIUM LASER APPLICATION (Left) as a surgical intervention.  The patient's history has been reviewed, patient examined, no change in status, stable for surgery.  I have reviewed the patient's chart and labs.  Questions were answered to the patient's satisfaction.     Keith [name] Isak Sotomayor

## 2019-10-27 ENCOUNTER — Other Ambulatory Visit: Payer: Self-pay | Admitting: Hematology & Oncology

## 2019-10-27 DIAGNOSIS — C178 Malignant neoplasm of overlapping sites of small intestine: Secondary | ICD-10-CM

## 2019-11-07 ENCOUNTER — Other Ambulatory Visit: Payer: Self-pay

## 2019-11-07 ENCOUNTER — Encounter (HOSPITAL_COMMUNITY)
Admission: RE | Admit: 2019-11-07 | Discharge: 2019-11-07 | Disposition: A | Discharge status: Home / Self Care | Payer: Managed Care, Other (non HMO) | Source: Ambulatory Visit | Attending: Hematology & Oncology | Admitting: Hematology & Oncology

## 2019-11-07 DIAGNOSIS — C178 Malignant neoplasm of overlapping sites of small intestine: Secondary | ICD-10-CM

## 2019-11-07 LAB — GLUCOSE, CAPILLARY: Glucose-Capillary: 92 mg/dL (ref 70–99)

## 2019-11-07 MED ORDER — FLUDEOXYGLUCOSE F - 18 (FDG) INJECTION
6.7000 | Freq: Once | INTRAVENOUS | Status: AC | PRN
  Administered 2019-11-07: 6.7 via INTRAVENOUS

## 2019-11-08 ENCOUNTER — Encounter: Payer: Self-pay | Admitting: Hematology & Oncology

## 2019-11-08 ENCOUNTER — Telehealth: Payer: Self-pay

## 2019-11-08 NOTE — Telephone Encounter (Signed)
Dr Marin Olp contacted patient by phone to discuss recent PET scan. dph

## 2019-11-14 ENCOUNTER — Encounter: Payer: Self-pay | Admitting: Hematology & Oncology

## 2019-11-22 ENCOUNTER — Other Ambulatory Visit: Payer: Self-pay | Admitting: Urology

## 2019-11-27 ENCOUNTER — Other Ambulatory Visit: Payer: Self-pay | Admitting: Hematology & Oncology

## 2019-11-27 MED ORDER — PANCRELIPASE (LIP-PROT-AMYL) 36000-114000 UNITS PO CPEP: 36000.0000 [IU] | ORAL_CAPSULE | Freq: Three times a day (TID) | ORAL | 0 refills | Status: DC

## 2019-11-28 NOTE — Progress Notes (Signed)
PCP - Betty Martinique, MD Cardiologist -   Chest x-ray - 08-16-19 EKG - 10-24-19 Stress Test -  ECHO -  Cardiac Cath -   Sleep Study -  CPAP -   Fasting Blood Sugar -  Checks Blood Sugar _____ times a day  Blood Thinner Instructions: Aspirin Instructions: Last Dose:  Anesthesia review:   Patient denies shortness of breath, fever, cough and chest pain at PAT appointment   Patient verbalized understanding of instructions that were given to them at the PAT appointment. Patient was also instructed that they will need to review over the PAT instructions again at home before surgery.

## 2019-11-28 NOTE — Patient Instructions (Addendum)
DUE TO COVID-19 ONLY ONE VISITOR IS ALLOWED TO COME WITH YOU AND STAY IN THE WAITING ROOM ONLY DURING PRE OP AND PROCEDURE DAY OF SURGERY. THE 1 VISITOR MAY VISIT WITH YOU AFTER SURGERY IN YOUR PRIVATE ROOM DURING VISITING HOURS ONLY!  YOU NEED TO HAVE A COVID 19 TEST ON 12/02/19 @ 10:15 AM, THIS TEST MUST BE DONE BEFORE SURGERY, COME  [place], [place]  , [address].  ([place]) ONCE YOUR COVID TEST IS COMPLETED, PLEASE BEGIN THE QUARANTINE INSTRUCTIONS AS OUTLINED IN YOUR HANDOUT.                Keith [name]  11/28/2019   Your procedure is scheduled on:  12-06-19   Report to [facility] Main  Entrance    Report to Admitting at 10:00 AM     Call this number if you have problems the morning of surgery [phone]    Remember: Do not eat food or drink liquids :After Midnight.     Take these medicines the morning of surgery with A SIP OF WATER: None  BRUSH YOUR TEETH MORNING OF SURGERY AND RINSE YOUR MOUTH OUT, NO CHEWING GUM CANDY OR MINTS.                                 You may not have any metal on your body including hair pins and              piercings     Do not wear jewelry, cologne, lotions, powders or deodorant                      Men may shave face and neck.   Do not bring valuables to the hospital. [place].  Contacts, dentures or bridgework may not be worn into surgery.  You may bring an overnight bag    Special Instructions: Please follow a Clear Liquid the day prior to your surgery, per your surgeon's instructions     CLEAR LIQUID DIET   Foods Allowed                                                                     Foods Excluded  Coffee and tea, regular and decaf                             liquids that you cannot  Plain Jell-O any favor except red or purple                                           see through such as: Fruit ices (not with fruit pulp)                                      milk, soups, orange juice  Iced Popsicles  All solid food Carbonated beverages, regular and diet                                    Cranberry, grape and apple juices Sports drinks like Gatorade Lightly seasoned clear broth or consume(fat free) Sugar, honey syrup  Sample Menu Breakfast                                Lunch                                     Supper Cranberry juice                    Beef broth                            Chicken broth Jell-O                                     Grape juice                           Apple juice Coffee or tea                        Jell-O                                      Popsicle                                                Coffee or tea                        Coffee or tea  _____________________________________________________________________                Please read over the following fact sheets you were given:              [place] - Preparing for Surgery Before surgery, you can play an important role.  Because skin is not sterile, your skin needs to be as free of germs as possible.  You can reduce the number of germs on your skin by washing with CHG (chlorahexidine gluconate) soap before surgery.  CHG is an antiseptic cleaner which kills germs and bonds with the skin to continue killing germs even after washing. Please DO NOT use if you have an allergy to CHG or antibacterial soaps.  If your skin becomes reddened/irritated stop using the CHG and inform your nurse when you arrive at Short Stay. Do not shave (including legs and underarms) for at least 48 hours prior to the first CHG shower.  You may shave your face/neck. Please follow these instructions carefully:  1.  Shower with CHG Soap the night before surgery and the  morning of Surgery.  2.  If you choose to wash your hair, wash your hair  first as usual with your  normal  shampoo.  3.  After you shampoo, rinse your hair  and body thoroughly to remove the  shampoo.                           4.  Use CHG as you would any other liquid soap.  You can apply chg directly  to the skin and wash                       Gently with a scrungie or clean washcloth.  5.  Apply the CHG Soap to your body ONLY FROM THE NECK DOWN.   Do not use on face/ open                           Wound or open sores. Avoid contact with eyes, ears mouth and genitals (private parts).                       Wash face,  Genitals (private parts) with your normal soap.             6.  Wash thoroughly, paying special attention to the area where your surgery  will be performed.  7.  Thoroughly rinse your body with warm water from the neck down.  8.  DO NOT shower/wash with your normal soap after using and rinsing off  the CHG Soap.                9.  Pat yourself dry with a clean towel.            10.  Wear clean pajamas.            11.  Place clean sheets on your bed the night of your first shower and do not  sleep with pets. Day of Surgery : Do not apply any lotions/deodorants the morning of surgery.  Please wear clean clothes to the hospital/surgery center.  FAILURE TO FOLLOW THESE INSTRUCTIONS MAY RESULT IN THE CANCELLATION OF YOUR SURGERY PATIENT SIGNATURE_________________________________  NURSE SIGNATURE__________________________________  ________________________________________________________________________

## 2019-11-30 ENCOUNTER — Other Ambulatory Visit: Payer: Self-pay

## 2019-11-30 ENCOUNTER — Encounter (HOSPITAL_COMMUNITY)
Admission: RE | Admit: 2019-11-30 | Discharge: 2019-11-30 | Disposition: A | Discharge status: Home / Self Care | Payer: Managed Care, Other (non HMO) | Source: Ambulatory Visit | Attending: Urology | Admitting: Urology

## 2019-11-30 ENCOUNTER — Encounter (HOSPITAL_COMMUNITY): Payer: Self-pay

## 2019-11-30 DIAGNOSIS — Z01812 Encounter for preprocedural laboratory examination: Secondary | ICD-10-CM | POA: Diagnosis not present

## 2019-11-30 LAB — BASIC METABOLIC PANEL
Anion gap: 5 (ref 5–15)
BUN: 18 mg/dL (ref 6–20)
CO2: 25 mmol/L (ref 22–32)
Calcium: 8.4 mg/dL — ABNORMAL LOW (ref 8.9–10.3)
Chloride: 112 mmol/L — ABNORMAL HIGH (ref 98–111)
Creatinine, Ser: 1.51 mg/dL — ABNORMAL HIGH (ref 0.61–1.24)
GFR calc Af Amer: >60 mL/min (ref >60)
GFR calc non Af Amer: 53 mL/min — ABNORMAL LOW (ref >60)
Glucose, Bld: 112 mg/dL — ABNORMAL HIGH (ref 70–99)
Potassium: 4.4 mmol/L (ref 3.5–5.1)
Sodium: 142 mmol/L (ref 135–145)

## 2019-11-30 LAB — CBC
HCT: 39.5 % (ref 39.0–52.0)
Hemoglobin: 12.6 g/dL — ABNORMAL LOW (ref 13.0–17.0)
MCH: 30.2 pg (ref 26.0–34.0)
MCHC: 31.9 g/dL (ref 30.0–36.0)
MCV: 94.7 fL (ref 80.0–100.0)
Platelets: 114 10*3/uL — ABNORMAL LOW (ref 150–400)
RBC: 4.17 MIL/uL — ABNORMAL LOW (ref 4.22–5.81)
RDW: 13.2 % (ref 11.5–15.5)
WBC: 4.5 10*3/uL (ref 4.0–10.5)
nRBC: 0 % (ref 0.0–0.2)

## 2019-12-02 ENCOUNTER — Other Ambulatory Visit (HOSPITAL_COMMUNITY)
Admission: RE | Admit: 2019-12-02 | Discharge: 2019-12-02 | Disposition: A | Discharge status: Home / Self Care | Payer: Managed Care, Other (non HMO) | Source: Ambulatory Visit | Attending: Urology | Admitting: Urology

## 2019-12-02 DIAGNOSIS — Z01812 Encounter for preprocedural laboratory examination: Secondary | ICD-10-CM | POA: Insufficient documentation

## 2019-12-02 DIAGNOSIS — Z20822 Contact with and (suspected) exposure to covid-19: Secondary | ICD-10-CM | POA: Insufficient documentation

## 2019-12-02 LAB — SARS CORONAVIRUS 2 (TAT 6-24 HRS): SARS Coronavirus 2: NEGATIVE

## 2019-12-06 ENCOUNTER — Encounter (HOSPITAL_COMMUNITY)
Admission: RE | Disposition: A | Discharge status: Home / Self Care | Payer: Self-pay | Source: Home / Self Care | Attending: Urology

## 2019-12-06 ENCOUNTER — Inpatient Hospital Stay (HOSPITAL_COMMUNITY): Payer: Managed Care, Other (non HMO) | Admitting: Physician Assistant

## 2019-12-06 ENCOUNTER — Encounter: Payer: Self-pay | Admitting: Hematology & Oncology

## 2019-12-06 ENCOUNTER — Encounter (HOSPITAL_COMMUNITY): Payer: Self-pay | Admitting: Urology

## 2019-12-06 ENCOUNTER — Other Ambulatory Visit: Payer: Self-pay

## 2019-12-06 ENCOUNTER — Inpatient Hospital Stay (HOSPITAL_COMMUNITY): Payer: Managed Care, Other (non HMO) | Admitting: Registered Nurse

## 2019-12-06 ENCOUNTER — Inpatient Hospital Stay (HOSPITAL_COMMUNITY)
Admission: RE | Admit: 2019-12-06 | Discharge: 2019-12-10 | DRG: 657 | Disposition: A | Discharge status: Home / Self Care | Payer: Managed Care, Other (non HMO) | Attending: Urology | Admitting: Urology

## 2019-12-06 DIAGNOSIS — Z9049 Acquired absence of other specified parts of digestive tract: Secondary | ICD-10-CM | POA: Diagnosis not present

## 2019-12-06 DIAGNOSIS — E119 Type 2 diabetes mellitus without complications: Secondary | ICD-10-CM | POA: Diagnosis present

## 2019-12-06 DIAGNOSIS — N131 Hydronephrosis with ureteral stricture, not elsewhere classified: Secondary | ICD-10-CM | POA: Diagnosis present

## 2019-12-06 DIAGNOSIS — K660 Peritoneal adhesions (postprocedural) (postinfection): Secondary | ICD-10-CM | POA: Diagnosis present

## 2019-12-06 DIAGNOSIS — Z8249 Family history of ischemic heart disease and other diseases of the circulatory system: Secondary | ICD-10-CM

## 2019-12-06 DIAGNOSIS — Z9221 Personal history of antineoplastic chemotherapy: Secondary | ICD-10-CM | POA: Diagnosis not present

## 2019-12-06 DIAGNOSIS — Z87891 Personal history of nicotine dependence: Secondary | ICD-10-CM

## 2019-12-06 DIAGNOSIS — Z923 Personal history of irradiation: Secondary | ICD-10-CM

## 2019-12-06 DIAGNOSIS — N135 Crossing vessel and stricture of ureter without hydronephrosis: Secondary | ICD-10-CM | POA: Diagnosis present

## 2019-12-06 DIAGNOSIS — Z85068 Personal history of other malignant neoplasm of small intestine: Secondary | ICD-10-CM | POA: Diagnosis not present

## 2019-12-06 DIAGNOSIS — Z903 Acquired absence of stomach [part of]: Secondary | ICD-10-CM | POA: Diagnosis not present

## 2019-12-06 DIAGNOSIS — I10 Essential (primary) hypertension: Secondary | ICD-10-CM | POA: Diagnosis present

## 2019-12-06 DIAGNOSIS — T8182XA Emphysema (subcutaneous) resulting from a procedure, initial encounter: Secondary | ICD-10-CM | POA: Diagnosis not present

## 2019-12-06 DIAGNOSIS — Z800 Family history of malignant neoplasm of digestive organs: Secondary | ICD-10-CM | POA: Diagnosis not present

## 2019-12-06 DIAGNOSIS — C662 Malignant neoplasm of left ureter: Principal | ICD-10-CM | POA: Diagnosis present

## 2019-12-06 DIAGNOSIS — D62 Acute posthemorrhagic anemia: Secondary | ICD-10-CM | POA: Diagnosis not present

## 2019-12-06 DIAGNOSIS — Z20822 Contact with and (suspected) exposure to covid-19: Secondary | ICD-10-CM | POA: Diagnosis present

## 2019-12-06 DIAGNOSIS — D4122 Neoplasm of uncertain behavior of left ureter: Secondary | ICD-10-CM | POA: Diagnosis present

## 2019-12-06 HISTORY — PX: OTHER SURGICAL HISTORY: SHX169

## 2019-12-06 LAB — HEMOGLOBIN AND HEMATOCRIT, BLOOD
HCT: 44.3 % (ref 39.0–52.0)
Hemoglobin: 14.1 g/dL (ref 13.0–17.0)

## 2019-12-06 SURGERY — REIMPLANTATION, URETER, ROBOT-ASSISTED, LAPAROSCOPIC
Anesthesia: General | Laterality: Left

## 2019-12-06 MED ORDER — CEFAZOLIN SODIUM-DEXTROSE 2-4 GM/100ML-% IV SOLN
2.0000 g | INTRAVENOUS | Status: AC
  Administered 2019-12-06: 2 g via INTRAVENOUS
  Filled 2019-12-06: qty 100

## 2019-12-06 MED ORDER — OXYCODONE HCL 5 MG PO TABS: 5.0000 mg | ORAL_TABLET | Freq: Once | ORAL | Status: DC | PRN

## 2019-12-06 MED ORDER — OXYCODONE HCL 5 MG/5ML PO SOLN: 5.0000 mg | Freq: Once | ORAL | Status: DC | PRN

## 2019-12-06 MED ORDER — ACETAMINOPHEN 160 MG/5ML PO SOLN: 325.0000 mg | ORAL | Status: DC | PRN

## 2019-12-06 MED ORDER — FENTANYL CITRATE (PF) 100 MCG/2ML IJ SOLN
INTRAMUSCULAR | Status: AC
  Filled 2019-12-06: qty 2

## 2019-12-06 MED ORDER — ONDANSETRON HCL 4 MG/2ML IJ SOLN: 4.0000 mg | INTRAMUSCULAR | Status: DC | PRN

## 2019-12-06 MED ORDER — DIPHENHYDRAMINE HCL 12.5 MG/5ML PO ELIX: 12.5000 mg | ORAL_SOLUTION | Freq: Four times a day (QID) | ORAL | Status: DC | PRN

## 2019-12-06 MED ORDER — HYDROMORPHONE HCL 1 MG/ML IJ SOLN
0.5000 mg | INTRAMUSCULAR | Status: DC | PRN
  Administered 2019-12-06: 1 mg via INTRAVENOUS
  Filled 2019-12-06: qty 1

## 2019-12-06 MED ORDER — DIPHENHYDRAMINE HCL 50 MG/ML IJ SOLN: 12.5000 mg | Freq: Four times a day (QID) | INTRAMUSCULAR | Status: DC | PRN

## 2019-12-06 MED ORDER — STERILE WATER FOR IRRIGATION IR SOLN
Status: DC | PRN
  Administered 2019-12-06: 3000 mL via INTRAVESICAL

## 2019-12-06 MED ORDER — PROPOFOL 10 MG/ML IV BOLUS
INTRAVENOUS | Status: AC
  Filled 2019-12-06: qty 20

## 2019-12-06 MED ORDER — ONDANSETRON HCL 4 MG/2ML IJ SOLN: 4.0000 mg | Freq: Once | INTRAMUSCULAR | Status: DC | PRN

## 2019-12-06 MED ORDER — PROPOFOL 10 MG/ML IV BOLUS
INTRAVENOUS | Status: DC | PRN
  Administered 2019-12-06: 100 mg via INTRAVENOUS

## 2019-12-06 MED ORDER — SUGAMMADEX SODIUM 200 MG/2ML IV SOLN
INTRAVENOUS | Status: DC | PRN
  Administered 2019-12-06: 120 mg via INTRAVENOUS

## 2019-12-06 MED ORDER — SULFAMETHOXAZOLE-TRIMETHOPRIM 800-160 MG PO TABS: 1.0000 | ORAL_TABLET | Freq: Two times a day (BID) | ORAL | 0 refills | Status: DC

## 2019-12-06 MED ORDER — DEXAMETHASONE SODIUM PHOSPHATE 10 MG/ML IJ SOLN
INTRAMUSCULAR | Status: DC | PRN
  Administered 2019-12-06: 8 mg via INTRAVENOUS

## 2019-12-06 MED ORDER — LACTATED RINGERS IV SOLN: INTRAVENOUS | Status: DC

## 2019-12-06 MED ORDER — FENTANYL CITRATE (PF) 100 MCG/2ML IJ SOLN
INTRAMUSCULAR | Status: DC | PRN
  Administered 2019-12-06 (×2): 50 ug via INTRAVENOUS
  Administered 2019-12-06: 100 ug via INTRAVENOUS

## 2019-12-06 MED ORDER — BUPIVACAINE LIPOSOME 1.3 % IJ SUSP
20.0000 mL | Freq: Once | INTRAMUSCULAR | Status: AC
  Administered 2019-12-06: 15:00:00 15 mL
  Filled 2019-12-06: qty 20

## 2019-12-06 MED ORDER — FENTANYL CITRATE (PF) 100 MCG/2ML IJ SOLN: 25.0000 ug | INTRAMUSCULAR | Status: DC | PRN

## 2019-12-06 MED ORDER — DOCUSATE SODIUM 100 MG PO CAPS
100.0000 mg | ORAL_CAPSULE | Freq: Two times a day (BID) | ORAL | Status: DC
  Administered 2019-12-06 – 2019-12-10 (×8): 100 mg via ORAL
  Filled 2019-12-06 (×8): qty 1

## 2019-12-06 MED ORDER — ROCURONIUM BROMIDE 10 MG/ML (PF) SYRINGE
PREFILLED_SYRINGE | INTRAVENOUS | Status: DC | PRN
  Administered 2019-12-06 (×3): 10 mg via INTRAVENOUS
  Administered 2019-12-06: 40 mg via INTRAVENOUS
  Administered 2019-12-06: 10 mg via INTRAVENOUS
  Administered 2019-12-06: 30 mg via INTRAVENOUS

## 2019-12-06 MED ORDER — INDIGOTINDISULFONATE SODIUM 8 MG/ML IJ SOLN
INTRAMUSCULAR | Status: AC
  Filled 2019-12-06: qty 5

## 2019-12-06 MED ORDER — DEXTROSE-NACL 5-0.45 % IV SOLN: INTRAVENOUS | Status: DC

## 2019-12-06 MED ORDER — ROCURONIUM BROMIDE 10 MG/ML (PF) SYRINGE
PREFILLED_SYRINGE | INTRAVENOUS | Status: AC
  Filled 2019-12-06: qty 10

## 2019-12-06 MED ORDER — OXYCODONE HCL 5 MG PO TABS
5.0000 mg | ORAL_TABLET | ORAL | Status: DC | PRN
  Administered 2019-12-08: 5 mg via ORAL
  Filled 2019-12-06: qty 1

## 2019-12-06 MED ORDER — BELLADONNA ALKALOIDS-OPIUM 16.2-60 MG RE SUPP: 1.0000 | Freq: Four times a day (QID) | RECTAL | Status: DC | PRN

## 2019-12-06 MED ORDER — MAGNESIUM CITRATE PO SOLN: 1.0000 | Freq: Once | ORAL | Status: DC

## 2019-12-06 MED ORDER — SODIUM CHLORIDE (PF) 0.9 % IJ SOLN
INTRAMUSCULAR | Status: AC
  Filled 2019-12-06: qty 20

## 2019-12-06 MED ORDER — LACTATED RINGERS IR SOLN
Status: DC | PRN
  Administered 2019-12-06: 1000 mL

## 2019-12-06 MED ORDER — MIDAZOLAM HCL 2 MG/2ML IJ SOLN
INTRAMUSCULAR | Status: DC | PRN
  Administered 2019-12-06: 2 mg via INTRAVENOUS

## 2019-12-06 MED ORDER — ACETAMINOPHEN 325 MG PO TABS: 325.0000 mg | ORAL_TABLET | ORAL | Status: DC | PRN

## 2019-12-06 MED ORDER — ONDANSETRON HCL 4 MG/2ML IJ SOLN
INTRAMUSCULAR | Status: DC | PRN
  Administered 2019-12-06: 4 mg via INTRAVENOUS

## 2019-12-06 MED ORDER — MIDAZOLAM HCL 2 MG/2ML IJ SOLN
INTRAMUSCULAR | Status: AC
  Filled 2019-12-06: qty 2

## 2019-12-06 MED ORDER — HYDROCODONE-ACETAMINOPHEN 5-325 MG PO TABS: 1.0000 | ORAL_TABLET | Freq: Four times a day (QID) | ORAL | 0 refills | Status: DC | PRN

## 2019-12-06 MED ORDER — MEPERIDINE HCL 50 MG/ML IJ SOLN
INTRAMUSCULAR | Status: AC
  Filled 2019-12-06: qty 1

## 2019-12-06 MED ORDER — BACITRACIN-NEOMYCIN-POLYMYXIN 400-5-5000 EX OINT: 1.0000 "application " | TOPICAL_OINTMENT | Freq: Three times a day (TID) | CUTANEOUS | Status: DC | PRN

## 2019-12-06 MED ORDER — ACETAMINOPHEN 500 MG PO TABS
1000.0000 mg | ORAL_TABLET | Freq: Four times a day (QID) | ORAL | Status: AC
  Administered 2019-12-06 – 2019-12-07 (×4): 1000 mg via ORAL
  Filled 2019-12-06 (×4): qty 2

## 2019-12-06 MED ORDER — MEPERIDINE HCL 50 MG/ML IJ SOLN
6.2500 mg | INTRAMUSCULAR | Status: DC | PRN
  Administered 2019-12-06: 12.5 mg via INTRAVENOUS

## 2019-12-06 MED ORDER — LIDOCAINE 2% (20 MG/ML) 5 ML SYRINGE
INTRAMUSCULAR | Status: AC
  Filled 2019-12-06: qty 5

## 2019-12-06 MED ORDER — PANCRELIPASE (LIP-PROT-AMYL) 12000-38000 UNITS PO CPEP
36000.0000 [IU] | ORAL_CAPSULE | Freq: Three times a day (TID) | ORAL | Status: DC
  Administered 2019-12-07 – 2019-12-10 (×7): 36000 [IU] via ORAL
  Filled 2019-12-06 (×10): qty 3

## 2019-12-06 MED ORDER — LIDOCAINE 2% (20 MG/ML) 5 ML SYRINGE
INTRAMUSCULAR | Status: DC | PRN
  Administered 2019-12-06: 40 mg via INTRAVENOUS

## 2019-12-06 SURGICAL SUPPLY — 109 items
APPLICATOR COTTON TIP 6 STRL (MISCELLANEOUS) ×1 IMPLANT
APPLICATOR COTTON TIP 6IN STRL (MISCELLANEOUS) ×3
APPLICATOR SURGIFLO ENDO (HEMOSTASIS) IMPLANT
APPLIER CLIP 5 13 M/L LIGAMAX5 (MISCELLANEOUS) ×3
BAG URINE DRAIN 2000ML AR STRL (UROLOGICAL SUPPLIES) ×3 IMPLANT
CATH FOLEY 2WAY SLVR  5CC 18FR (CATHETERS) ×2
CATH FOLEY 2WAY SLVR 5CC 18FR (CATHETERS) ×1 IMPLANT
CATH FOLEY 3WAY  5CC 18FR (CATHETERS)
CATH FOLEY 3WAY 30CC 22FR (CATHETERS) ×3 IMPLANT
CATH FOLEY 3WAY 5CC 18FR (CATHETERS) IMPLANT
CHLORAPREP W/TINT 26 (MISCELLANEOUS) ×3 IMPLANT
CLIP APPLIE 5 13 M/L LIGAMAX5 (MISCELLANEOUS) ×1 IMPLANT
CLIP VESOLOCK LG 6/CT PURPLE (CLIP) ×6 IMPLANT
CLIP VESOLOCK MED LG 6/CT (CLIP) ×3 IMPLANT
CLIP VESOLOCK XL 6/CT (CLIP) ×6 IMPLANT
COVER SURGICAL LIGHT HANDLE (MISCELLANEOUS) ×3 IMPLANT
COVER TIP SHEARS 8 DVNC (MISCELLANEOUS) ×1 IMPLANT
COVER TIP SHEARS 8MM DA VINCI (MISCELLANEOUS) ×2
COVER WAND RF STERILE (DRAPES) IMPLANT
DECANTER SPIKE VIAL GLASS SM (MISCELLANEOUS) ×3 IMPLANT
DERMABOND ADVANCED (GAUZE/BANDAGES/DRESSINGS) ×2
DERMABOND ADVANCED .7 DNX12 (GAUZE/BANDAGES/DRESSINGS) ×1 IMPLANT
DRAIN CHANNEL 15F RND FF 3/16 (WOUND CARE) ×3 IMPLANT
DRAIN CHANNEL RND F F (WOUND CARE) IMPLANT
DRAPE ARM DVNC X/XI (DISPOSABLE) ×4 IMPLANT
DRAPE COLUMN DVNC XI (DISPOSABLE) ×1 IMPLANT
DRAPE DA VINCI XI ARM (DISPOSABLE) ×8
DRAPE DA VINCI XI COLUMN (DISPOSABLE) ×2
DRAPE INCISE IOBAN 66X45 STRL (DRAPES) ×3 IMPLANT
DRAPE SHEET LG 3/4 BI-LAMINATE (DRAPES) ×6 IMPLANT
DRAPE SURG IRRIG POUCH 19X23 (DRAPES) ×3 IMPLANT
DRSG TEGADERM 4X4.75 (GAUZE/BANDAGES/DRESSINGS) ×3 IMPLANT
ELECT REM PT RETURN 15FT ADLT (MISCELLANEOUS) ×3 IMPLANT
EVACUATOR SILICONE 100CC (DRAIN) ×3 IMPLANT
GAUZE SPONGE 2X2 8PLY STRL LF (GAUZE/BANDAGES/DRESSINGS) ×1 IMPLANT
GLOVE BIO SURGEON STRL SZ 6.5 (GLOVE) ×2 IMPLANT
GLOVE BIO SURGEONS STRL SZ 6.5 (GLOVE) ×1
GLOVE BIOGEL M STRL SZ7.5 (GLOVE) ×9 IMPLANT
GOWN STRL REUS W/TWL LRG LVL3 (GOWN DISPOSABLE) ×6 IMPLANT
GUIDEWIRE STR DUAL SENSOR (WIRE) ×3 IMPLANT
HOLDER FOLEY CATH W/STRAP (MISCELLANEOUS) ×3 IMPLANT
IRRIG SUCT STRYKERFLOW 2 WTIP (MISCELLANEOUS) ×3
IRRIGATION SUCT STRKRFLW 2 WTP (MISCELLANEOUS) ×1 IMPLANT
KIT BASIN OR (CUSTOM PROCEDURE TRAY) ×3 IMPLANT
KIT PROCEDURE DA VINCI SI (MISCELLANEOUS) ×2
KIT PROCEDURE DVNC SI (MISCELLANEOUS) ×1 IMPLANT
KIT TURNOVER KIT A (KITS) IMPLANT
LOOP VESSEL MAXI BLUE (MISCELLANEOUS) ×3 IMPLANT
NDL SAFETY ECLIPSE 18X1.5 (NEEDLE) ×1 IMPLANT
NEEDLE HYPO 18GX1.5 SHARP (NEEDLE) ×2
NEEDLE INSUFFLATION 14GA 120MM (NEEDLE) ×3 IMPLANT
NS IRRIG 1000ML POUR BTL (IV SOLUTION) ×3 IMPLANT
PAD POSITIONING PINK XL (MISCELLANEOUS) IMPLANT
PENCIL SMOKE EVACUATOR (MISCELLANEOUS) IMPLANT
PORT ACCESS TROCAR AIRSEAL 12 (TROCAR) ×1 IMPLANT
PORT ACCESS TROCAR AIRSEAL 5M (TROCAR) ×2
POUCH SPECIMEN RETRIEVAL 10MM (ENDOMECHANICALS) ×3 IMPLANT
SCISSORS LAP 5X45 EPIX DISP (ENDOMECHANICALS) ×3 IMPLANT
SEAL CANN UNIV 5-8 DVNC XI (MISCELLANEOUS) ×4 IMPLANT
SEAL XI 5MM-8MM UNIVERSAL (MISCELLANEOUS) ×8
SET TRI-LUMEN FLTR TB AIRSEAL (TUBING) ×3 IMPLANT
SHEET LAVH (DRAPES) IMPLANT
SOLUTION ELECTROLUBE (MISCELLANEOUS) ×3 IMPLANT
SPONGE GAUZE 2X2 STER 10/PKG (GAUZE/BANDAGES/DRESSINGS) ×2
SPONGE LAP 4X18 RFD (DISPOSABLE) ×3 IMPLANT
STENT URET 6FRX24 CONTOUR (STENTS) ×3 IMPLANT
SURGIFLO W/THROMBIN 8M KIT (HEMOSTASIS) IMPLANT
SUT ETHILON 2 0 PS N (SUTURE) ×3 IMPLANT
SUT MNCRL 3 0 VIOLET RB1 (SUTURE) IMPLANT
SUT MNCRL AB 4-0 PS2 18 (SUTURE) ×6 IMPLANT
SUT MON AB 2-0 SH 27 (SUTURE)
SUT MON AB 2-0 SH27 (SUTURE) IMPLANT
SUT MONOCRYL 3 0 RB1 (SUTURE)
SUT PDS AB 0 CT1 36 (SUTURE) ×6 IMPLANT
SUT PDS AB 1 CTX 36 (SUTURE) IMPLANT
SUT PDS AB 2-0 CT2 27 (SUTURE) ×6 IMPLANT
SUT PDS AB 3-0 SH 27 (SUTURE) IMPLANT
SUT PDS AB 4-0 RB1 27 (SUTURE) ×6 IMPLANT
SUT PDS AB 4-0 SH 27 (SUTURE) IMPLANT
SUT PROLENE 2 0 SH DA (SUTURE) IMPLANT
SUT SILK 0 (SUTURE)
SUT SILK 0 30XBRD TIE 6 (SUTURE) IMPLANT
SUT SILK 2 0 (SUTURE)
SUT SILK 2 0 SH CR/8 (SUTURE) IMPLANT
SUT SILK 2-0 30XBRD TIE 12 (SUTURE) IMPLANT
SUT SILK 3 0 (SUTURE)
SUT SILK 3 0 12 30 (SUTURE) IMPLANT
SUT SILK 3-0 18XBRD TIE 12 (SUTURE) IMPLANT
SUT V-LOC BARB 180 2/0GR6 GS22 (SUTURE) ×6
SUT VIC AB 0 CT1 27 (SUTURE)
SUT VIC AB 0 CT1 27XBRD ANTBC (SUTURE) IMPLANT
SUT VIC AB 2-0 SH 27 (SUTURE) ×2
SUT VIC AB 2-0 SH 27X BRD (SUTURE) ×1 IMPLANT
SUT VIC AB 3-0 SH 27 (SUTURE)
SUT VIC AB 3-0 SH 27X BRD (SUTURE) IMPLANT
SUT VIC AB 4-0 RB1 27 (SUTURE) ×6
SUT VIC AB 4-0 RB1 27XBRD (SUTURE) ×3 IMPLANT
SUT VIC AB 4-0 SH 18 (SUTURE) IMPLANT
SUT VICRYL 0 UR6 27IN ABS (SUTURE) ×3 IMPLANT
SUT VLOC 180 2-0 6IN GS21 (SUTURE) ×3 IMPLANT
SUT VLOC BARB 180 ABS3/0GR12 (SUTURE) ×3
SUTURE V-LC BRB 180 2/0GR6GS22 (SUTURE) ×2 IMPLANT
SUTURE VLOC BRB 180 ABS3/0GR12 (SUTURE) ×1 IMPLANT
TOWEL OR 17X26 10 PK STRL BLUE (TOWEL DISPOSABLE) ×3 IMPLANT
TRAY LAPAROSCOPIC (CUSTOM PROCEDURE TRAY) ×3 IMPLANT
TROCAR XCEL 12X100 BLDLESS (ENDOMECHANICALS) ×3 IMPLANT
TROCAR XCEL NON-BLD 5MMX100MML (ENDOMECHANICALS) ×3 IMPLANT
WATER STERILE IRR 1000ML POUR (IV SOLUTION) ×3 IMPLANT
YANKAUER SUCT BULB TIP 10FT TU (MISCELLANEOUS) IMPLANT

## 2019-12-06 NOTE — Plan of Care (Signed)
Call bell and belonging are within reach. Bed alarm set. Patient will call for assistance when needing to get out of the bed.

## 2019-12-06 NOTE — Progress Notes (Signed)
Patient arrived to unit A&Ox4, drowsy easy to arouse. Dressing LLQ ,JP site is saturated. Will reinforce and reassess.

## 2019-12-06 NOTE — Anesthesia Procedure Notes (Signed)
Procedure Name: Intubation Date/Time: 12/06/2019 11:17 AM Performed by: Niel Hummer, CRNA Pre-anesthesia Checklist: Patient identified, Emergency Drugs available, Suction available and Patient being monitored Patient Re-evaluated:Patient Re-evaluated prior to induction Oxygen Delivery Method: Circle system utilized Preoxygenation: Pre-oxygenation with 100% oxygen Induction Type: IV induction Ventilation: Mask ventilation without difficulty Laryngoscope Size: Mac and 3 Grade View: Grade I Tube type: Oral Tube size: 7.5 mm Number of attempts: 1 Airway Equipment and Method: Stylet Placement Confirmation: ETT inserted through vocal cords under direct vision,  positive ETCO2 and breath sounds checked- equal and bilateral Secured at: 22 cm Tube secured with: Tape Dental Injury: Teeth and Oropharynx as per pre-operative assessment

## 2019-12-06 NOTE — Progress Notes (Addendum)
Patient reports feeling tired at this time and request to walk later this evening. Dressing JP site assessed and changed. Some bleeding  around JP site with 50 cc output from JP.  Night RN aware and will reassess.

## 2019-12-06 NOTE — Anesthesia Preprocedure Evaluation (Signed)
Anesthesia Evaluation  Patient identified by MRN, date of birth, ID band Patient awake    Reviewed: Allergy & Precautions, NPO status , Patient's Chart, lab work & pertinent test results  History of Anesthesia Complications Negative for: history of anesthetic complications  Airway Mallampati: I  TM Distance: >3 FB Neck ROM: Full    Dental no notable dental hx.    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  Reproductive/Obstetrics negative OB ROS                             Anesthesia Physical  Anesthesia Plan  ASA: III  Anesthesia Plan: General   Post-op Pain Management:    Induction: Intravenous  PONV Risk Score and Plan: 2 and Treatment may vary due to age or medical condition, Ondansetron and Midazolam  Airway Management Planned: LMA and Oral ETT  Additional Equipment: None  Intra-op Plan:   Post-operative Plan: Extubation in OR  Informed Consent: I have reviewed the patients History and Physical, chart, labs and discussed the procedure including the risks, benefits and alternatives for the proposed anesthesia with the patient or authorized representative who has indicated his/her understanding and acceptance.     Dental advisory given  Plan Discussed with: CRNA and Anesthesiologist  Anesthesia Plan Comments:        Anesthesia Quick  Evaluation

## 2019-12-06 NOTE — Anesthesia Postprocedure Evaluation (Signed)
Anesthesia Post Note  Patient: Keith [name]  Procedure(s) Performed: XI ROBOTICALLY ASSISTED LAPAROSCOPIC URETERAL RE-IMPLANTATION (Left )     Patient location during evaluation: PACU Anesthesia Type: General Level of consciousness: awake and alert Pain management: pain level controlled Vital Signs Assessment: post-procedure vital signs reviewed and stable Respiratory status: spontaneous breathing, nonlabored ventilation, respiratory function stable and patient connected to nasal cannula oxygen Cardiovascular status: blood pressure returned to baseline and stable Postop Assessment: no apparent nausea or vomiting Anesthetic complications: no    Last Vitals:  Vitals:   12/06/19 1545 12/06/19 1600  BP: (!) 168/100 (!) 145/93  Pulse: 75 72  Resp: 18 13  Temp:    SpO2: 100% 100%    Last Pain:  Vitals:   12/06/19 1600  TempSrc:   PainSc: Asleep                 Libby Goehring

## 2019-12-06 NOTE — H&P (Signed)
Keith [name] is an 49 y.o. male.    Chief Complaint: Pre-Op LEFT Robotic Distal Ureterectomy / Reimplantation  HPI:   1 - Left Distal Ureteral Mass / Malignant Obstruction - abtou 4cm left distal ureteral mass by CT, PET-Avid, and severely obstructing by ureteroscopy23/2021. No additional PET avid lesions. Lesion well below iliacs. Cr 1.8 in setting of obstruction. Impassable by ureteroscopy.    PMH sig for metastatic duodenal CA (surgery supraumbilcal, XRT,Xeloda 2015, follows P. Ennever). He is a Chemical engineer. His wife "Keith [name]" is very involved as well. His PCP is Keith Martinique MD.   Today " Keith [name] " is seen to proceed with LEFT distal ureterectomy / reimplant for localized neoplasm. NO interval fevers.      Past Medical History:  Diagnosis Date  . Cancer of small intestine, including duodenum ([place]) 10/26/2013   small intestine adenocarcinoma stage IIIc   . CHF (congestive heart failure) ([place])    patient denies  . Chronic hepatitis B ([place])   . Diabetes mellitus without complication ([place])    patient denies  . GERD (gastroesophageal reflux disease)    remote history  . Hydronephrosis of left kidney   . Hypertension    no medications at this time  . Impaired fasting glucose   . Lung nodules    2 mm nodule in the posterior left lower lobe (4/115), stable. 4 mm ground-glass nodule in the apical segment right upper lobe (4/30), unchanged as well.  . Platelets decreased ([place])   . Radiation 01/31/14-03/06/14   upper abdomen/surgical bed    Past Surgical History:  Procedure Laterality Date  . ABDOMINAL EXPLORATION SURGERY  09/19/2013   done in [place]  . APPENDECTOMY  1997  . BACK SURGERY     tumor excision on back  . COLONOSCOPY    . CYSTOSCOPY WITH RETROGRADE PYELOGRAM, URETEROSCOPY AND STENT PLACEMENT Left 10/26/2019   Procedure: CYSTOSCOPY WITH LEFT RETROGRADE PYELOGRAM, URETEROSCOPY;  Surgeon: Franchot Gallo, MD;  Location: [facility];  Service: Urology;  Laterality: Left;   1 HR  . PANCREATICODUODENECTOMY  09/19/2013  . PARTIAL GASTRECTOMY  09/19/2013  . UPPER GI ENDOSCOPY      Family History  Problem Relation Age of Onset  . Cancer Mother        Ureter Cancer  . Hypertension Other   . Colon cancer Sister 18  . Cancer Sister 20       colon  . Esophageal cancer Father 9  . Cancer Father        thyroid   Social History:  reports that he quit smoking about 6 years ago. His smoking use included cigarettes. He started smoking about 33 years ago. He has a 6.25 pack-year smoking history. He has never used smokeless tobacco. He reports that he does not drink alcohol or use drugs.  Allergies: No Known Allergies  No medications prior to admission.    No results found for this or any previous visit (from the past 48 hour(s)). No results found.  Review of Systems  Constitutional: Negative for chills and fever.  All other systems reviewed and are negative.   There were no vitals taken for this visit. Physical Exam  Constitutional: He appears well-developed.  HENT:  Head: Normocephalic.  Eyes: Pupils are equal, round, and reactive to light.  Cardiovascular: Normal rate.  Respiratory: Effort normal.  GI:  Supra-umbilical scar w/o hernias.   Genitourinary:    Genitourinary Comments: Minimal left CVAT at present.    Musculoskeletal:  General: Normal range of motion.     Cervical back: Normal range of motion.  Neurological: He is alert.  Skin: Skin is warm.  Psychiatric: He has a normal mood and affect.     Assessment/Plan  Proceed as planned with LEFT distal ureterectomy / reimplant, pelvic node dissection for high grade obstructing neolasm. Risks, benefits, alternatives, expected peri-op course including abtou 10 days post-op foley, discussed previously and reiterated today.,   Alexis Frock, MD 12/06/2019, 7:16 AM

## 2019-12-06 NOTE — Discharge Instructions (Signed)
1- Drain Sites - You may have some mild persistent drainage from old drain site for several days, this is normal. This can be covered with cotton gauze for convenience.  2 - Stiches - Your stitches are all dissolvable. You may notice a "loose thread" at your incisions, these are normal and require no intervention. You may cut them flush to the skin with fingernail clippers if needed for comfort.  3 - Diet - No restrictions  4 - Activity - No heavy lifting / straining (any activities that require valsalva or "bearing down") x 4 weeks. Otherwise, no restrictions.  5 - Bathing - You may shower immediately. Do not take a bath or get into swimming pool where incision sites are submersed in water x 4 weeks.   6 - Catheter - Will remain in place until removed at your next appointment. It may be cleaned with soap and water in the shower. It may be disconnected from the drain bag while in the shower to avoid tripping over the tube. You may apply Neosporin or Vaseline ointment as needed to the tip of the penis where the catheter inserts to reduce friction and irritation in this spot.   7 - When to Call the Doctor - Call MD for any fever >102, any acute wound problems, or any severe nausea / vomiting. You can call the [facility] for emergencies [phone]) 24 hours a day 365 days a year. It will roll-over to the answering service and on-call physician after hours.   You may resume aspirin, advil, aleve, vitamins, and supplements 7 days after surgery.

## 2019-12-06 NOTE — Transfer of Care (Signed)
Immediate Anesthesia Transfer of Care Note  Patient: Keith [name]  Procedure(s) Performed: XI ROBOTICALLY ASSISTED LAPAROSCOPIC URETERAL RE-IMPLANTATION (Left )  Patient Location: PACU  Anesthesia Type:General  Level of Consciousness: awake, alert  and oriented  Airway & Oxygen Therapy: Patient Spontanous Breathing and Patient connected to face mask oxygen  Post-op Assessment: Report given to RN, Post -op Vital signs reviewed and stable and Patient moving all extremities X 4  Post vital signs: Reviewed and stable  Last Vitals:  Vitals Value Taken Time  BP 156/93 12/06/19 1520  Temp    Pulse 77 12/06/19 1521  Resp 15 12/06/19 1521  SpO2 100 % 12/06/19 1521  Vitals shown include unvalidated device data.  Last Pain:  Vitals:   12/06/19 1031  TempSrc:   PainSc: 0-No pain         Complications: No apparent anesthesia complications

## 2019-12-06 NOTE — Brief Op Note (Signed)
12/06/2019  3:10 PM  PATIENT:  Keith [name]  49 y.o. male  PRE-OPERATIVE DIAGNOSIS:  LEFT DISTAL URETERAL CANCER  POST-OPERATIVE DIAGNOSIS:  LEFT DISTAL URETERAL CANCER  PROCEDURE:  Procedure(s) with comments: XI ROBOTICALLY ASSISTED LAPAROSCOPIC URETERAL RE-IMPLANTATION (Left) - 3 HRS  SURGEON:  Surgeon(s) and Role:    * Alexis Frock, MD - Primary  PHYSICIAN ASSISTANT:   ASSISTANTS: Debbrah Alar PA   ANESTHESIA:   local and general  EBL:  30 mL   BLOOD ADMINISTERED:none  DRAINS: 1 - JP to bulb; 2 - Foley to gravity   LOCAL MEDICATIONS USED:  MARCAINE     SPECIMEN:  Source of Specimen:  pelvic lymph nodes, left distal ureter, left distal ureteral margins  DISPOSITION OF SPECIMEN:  PATHOLOGY  COUNTS:  YES  TOURNIQUET:  * No tourniquets in log *  DICTATION: .Other Dictation: Dictation Number [id number]  PLAN OF CARE: Admit to inpatient   PATIENT DISPOSITION:  PACU - hemodynamically stable.   Delay start of Pharmacological VTE agent (>24hrs) due to surgical blood loss or risk of bleeding: yes

## 2019-12-07 LAB — BASIC METABOLIC PANEL
Anion gap: 5 (ref 5–15)
Anion gap: 6 (ref 5–15)
Anion gap: 6 (ref 5–15)
BUN: 33 mg/dL — ABNORMAL HIGH (ref 6–20)
BUN: 35 mg/dL — ABNORMAL HIGH (ref 6–20)
BUN: 33 mg/dL — ABNORMAL HIGH (ref 6–20)
CO2: 25 mmol/L (ref 22–32)
CO2: 26 mmol/L (ref 22–32)
CO2: 28 mmol/L (ref 22–32)
Calcium: 7.4 mg/dL — ABNORMAL LOW (ref 8.9–10.3)
Calcium: 7.6 mg/dL — ABNORMAL LOW (ref 8.9–10.3)
Calcium: 8 mg/dL — ABNORMAL LOW (ref 8.9–10.3)
Chloride: 101 mmol/L (ref 98–111)
Chloride: 100 mmol/L (ref 98–111)
Chloride: 103 mmol/L (ref 98–111)
Creatinine, Ser: 2.27 mg/dL — ABNORMAL HIGH (ref 0.61–1.24)
Creatinine, Ser: 2.3 mg/dL — ABNORMAL HIGH (ref 0.61–1.24)
Creatinine, Ser: 1.92 mg/dL — ABNORMAL HIGH (ref 0.61–1.24)
GFR calc Af Amer: 38 mL/min — ABNORMAL LOW (ref >60)
GFR calc Af Amer: 37 mL/min — ABNORMAL LOW (ref >60)
GFR calc Af Amer: 46 mL/min — ABNORMAL LOW (ref >60)
GFR calc non Af Amer: 32 mL/min — ABNORMAL LOW (ref >60)
GFR calc non Af Amer: 32 mL/min — ABNORMAL LOW (ref >60)
GFR calc non Af Amer: 40 mL/min — ABNORMAL LOW (ref >60)
Glucose, Bld: 254 mg/dL — ABNORMAL HIGH (ref 70–99)
Glucose, Bld: 200 mg/dL — ABNORMAL HIGH (ref 70–99)
Glucose, Bld: 116 mg/dL — ABNORMAL HIGH (ref 70–99)
Potassium: 7.2 mmol/L (ref 3.5–5.1)
Potassium: 5.4 mmol/L — ABNORMAL HIGH (ref 3.5–5.1)
Potassium: 5.2 mmol/L — ABNORMAL HIGH (ref 3.5–5.1)
Sodium: 131 mmol/L — ABNORMAL LOW (ref 135–145)
Sodium: 132 mmol/L — ABNORMAL LOW (ref 135–145)
Sodium: 137 mmol/L (ref 135–145)

## 2019-12-07 LAB — HEMOGLOBIN AND HEMATOCRIT, BLOOD
HCT: 31.4 % — ABNORMAL LOW (ref 39.0–52.0)
Hemoglobin: 10.3 g/dL — ABNORMAL LOW (ref 13.0–17.0)

## 2019-12-07 LAB — BRAIN NATRIURETIC PEPTIDE: B Natriuretic Peptide: 41.5 pg/mL (ref 0.0–100.0)

## 2019-12-07 MED ORDER — SODIUM CHLORIDE 0.9 % IV SOLN: INTRAVENOUS | Status: DC

## 2019-12-07 MED ORDER — CHLORHEXIDINE GLUCONATE CLOTH 2 % EX PADS
6.0000 | MEDICATED_PAD | Freq: Every day | CUTANEOUS | Status: DC
  Administered 2019-12-07 – 2019-12-09 (×3): 6 via TOPICAL

## 2019-12-07 NOTE — Progress Notes (Signed)
1 Day Post-Op Subjective: The patient is doing well.  No nausea or vomiting. Pain is adequately controlled.  K elevated on initial labs, improved on recheck; likely hemolyzed earlier.  Objective: Vital signs in last 24 hours: Temp:  [98 F (36.7 C)-100.8 F (38.2 C)] 98 F (36.7 C) (04/01 1318) Pulse Rate:  [62-89] 64 (04/01 1318) Resp:  [16-20] 18 (04/01 1318) BP: (104-144)/(62-100) 121/87 (04/01 1318) SpO2:  [99 %-100 %] 100 % (04/01 1318)  Intake/Output from previous day: 03/31 0701 - 04/01 0700 In: 1991.7 [I.V.:1891.7; IV Piggyback:100] Out: 763 [Urine:550; Drains:183; Blood:30] Intake/Output this shift: Total I/O In: 499.3 [P.O.:60; I.V.:439.3] Out: 370 [Drains:370]  Physical Exam:  General: Alert and oriented. CV: Regular rate Lungs: Clear bilaterally. GI: Soft, appropriately tender. JP with dark serosang fluid.  Incisions: Clean, dry, and intact Urine: Clear, dark yellow Extremities: Nontender, no erythema, no edema.  Lab Results: Recent Labs    12/06/19 1602 12/07/19 0442  HGB 14.1 10.3*  HCT 44.3 31.4*      Assessment/Plan: POD# 1 s/p robotic distal ureterectomy and ureteral reimplant.   1) Advance diet as tolerated as patient now passing gas 2) Ambulate, Incentive spirometry 3) AM labs 4) Continue foley 5) Continue pelvic drain    LOS: 1 day   Haskel Schroeder 12/07/2019, 4:32 PM

## 2019-12-07 NOTE — Op Note (Signed)
NAMEJOURNEY, R[place]ABAU[place] MEDICAL RECORD [id number] ACCOUNT [id number] DATE OF BIRTH:10-11-1970 FACILITY: [facility] LOCATION: [facility] PHYSICIAN:Chancey Cullinane Tresa Moore, MD  OPERATIVE REPORT  DATE OF PROCEDURE:  12/06/2019  SURGEON:  Alexis Frock, MD  PREOPERATIVE DIAGNOSIS:  Left distal ureteral neoplasm.  PROCEDURES: 1.  Robotic-assisted laparoscopic left distal ureterectomy with ureteral reimplantation. 2.  Bilateral pelvic lymphadenectomy. 3.  Extensive laparoscopic adhesiolysis.  ESTIMATED BLOOD LOSS:  100 mL.  COMPLICATIONS:  None.  SPECIMENS: 1.  Left distal ureteral margin frozen section, some periureteral dysplasia. 2.  Final left distal ureteral margin. 3.  Left distal ureter. 4.  Left external iliac lymph nodes. 5.  Left obturator lymph nodes. 6.  Left common iliac lymph nodes. 7.  Left periureteral lymph nodes, sentinel  ASSISTANT:  Debbrah Alar, PA  DRAINS:   1.  Jackson-Pratt drain to suction. 2.  Foley catheter to straight drain.  FINDINGS: 1.  Solid, visibly intraluminal left distal ureteral neoplasm occupying approximately the distal 4 cm of the left ureter. 2.  Numerous adhesions in the supraumbilical midline, mostly omental, some containing small bowel.  INDICATIONS:  The patient is a very pleasant 49 year old man with history of advanced upper gastrointestinal tumor.  He is status post surgery and radiation for this.  He has been without recurrence for a number of years.  He was found on surveillance of  this, however, to have a new hydronephrosis on the left and further evaluation of this revealed a left distal ureteral neoplasm that was quite obstructing.  There was no obvious metastatic disease.  It was quite unclear whether this is metastatic focus  from his prior GI tumor which would be exceedingly unusual versus a new primary neoplasm of the ureter.  Options discussed including recommended path of distal ureteral resection with reimplantation and no dissection and he  wished to proceed.  Informed  consent was obtained and placed in the medical record.  DESCRIPTION OF PROCEDURE:  The patient being identified, the procedure being left distal ureterectomy with reimplantation, pelvic node dissection was confirmed.  Procedure timeout was performed.  Intravenous antibiotics were administered.  General  endotracheal anesthesia induced.  The patient was placed into a low lithotomy position.  Sterile field was created, prepping and draping the base of the penis, perineum and proximal thighs using iodine and his infra-xiphoid abdomen using chlorhexidine  gluconate.  After he was further fastened to operative table using 3-inch tape over foam padding, a test of steep Trendelenburg positioning was performed and found to be suitably positioned.  Foley catheter was placed free to straight drain.  Careful  review of the patient's axial imaging and his visible abdominal scars in the midline including radiation tattoo sites, suggested that initial access to the belly away from the midline would be most prudent.  As such, Veress technique was used in a  position approximately 3 fingerbreadths lateral and inferior to the umbilicus.  Having passed the aspiration and drop test, a high-flow, low-pressure pneumoperitoneum was easily obtained.  An 8 mm robotic camera port was then placed in same location.   Laparoscopic examination of peritoneal cavity revealed a relative paucity of adhesions in the pelvis; however, there were significant adhesions in the upper abdomen, including the midline as expected.  Additional ports were then very carefully placed as  follows:  Left far lateral 8 mm robotic port, right paramedian 8 mm robotic port, right far lateral 12 mm AirSeal port.  At this point, there was insufficient window away from adhesions to place a midline camera  port.  As such, very careful adhesiolysis  was performed using sharp scissors, taking down multiple loops of small bowel near the  midline, as well as some areas of the omentum.  Exquisite care was taken to avoid any bowel injury, which did not occur.  We were able to establish a clear plane to an  area approximately 4 cm above the umbilicus.  After establishing this, midline port was placed approximately 2 cm above the umbilicus.  The robot was docked.  Attention was directed at development of the left retroperitoneum.  Incision was made lateral  to the descending colon from the iliac vessels superiorly towards the area of the origin of the diaphragm and inferiorly just lateral to the left medial umbilical ligament, coursing towards the area of the left internal ring and a posterior peritoneal  flap was created and this was used as a medial bucket handle retractor.  The left ureter was encountered as it coursed over the iliac vessels.  It was quite dilated and hydronephrotic as anticipated.  This was circumferentially mobilized with a vessel  loop, dissected proximally to the area of the gonadal crossing and then distally towards the area of the ureterovesical junction and the superior vesicle artery.  As expected, the caliber of the ureter became larger towards the area of the bladder and  palpation of the robot revealed a very good compressibility of the ureter to a point approximately 4 cm above the ureterovesical junction as anticipated, this being the site of neoplasm.  Next, a percutaneously placed spinal needle was placed just above  the pubic symphysis and the area of tumor was purposely injected with 0.2 mL of indocyanine green dye.  Approximately 10 minutes post-dye injection, the pelvis was inspected under near and fluorescence light.  There were several areas of lymphatic  channels seen coursing towards the pelvic lymph node field on the left side.  Attention was then directed at the pelvic lymphadenectomy.  First, template lymphadenectomy was performed.  First, the left external iliac group with boundaries being left   external iliac artery, vein, pelvic sidewall, iliac bifurcation.  Lymphostasis achieved with cold clips.  Next, the left obturator group was dissected free with the boundaries being left external iliac vein, pelvic sidewall, obturator nerve.   Lymphostasis was again achieved with cold clips.  Left obturator nerve was inspected following these maneuvers and found to be uninjured.  Next, the left common iliac group was dissected free with the boundaries being aortic bifurcation and iliac  bifurcation.  Lymphostasis was again achieved with cold clips.  There was an area of sentinel lymph nodes seen in the periureteral location encompassing approximately 2 lymph nodes.  These were dissected free and set aside and labeled as periureteral  lymph nodes, sentinel.  Next, an area of compressible ureter estimated to be approximately 2 cm proximal to the area of noncompressibility was identified for initial transection.  This was doubly clipped and ligated.  Frozen section did reveal  unremarkable for intraluminal neoplasm, but some question of neoplasm in the periureteral location.  At this point, the distal stump was then circumferentially mobilized to the ureterovesical junction where it was excised, encompassing the ureteral  orifice, immediately placed in an Endocatch bag for later retrieval.  This small cystotomy was then closed at the level of the bladder using running 3-0 V-Loc.  The bladder was then filled to 200 mL.  No evidence of leak was noted.  Attention was  directed at reimplantation.  The space  of Retzius was partially developed between the medial umbilical ligaments towards the area of the pubic symphysis, which provided more bladder mobility and the bladder had sufficient mobility such that the ureter  easily reached the dome with minimal psoas hitching.  Psoas hitch was then performed using a 2-0 PDS x2 along the area in axis of the psoas muscle to avoid nerve injury.  The peritoneum overlying the  bladder dome was tethered to this which provided  additional mobility of the dome of the bladder.  This also made it possible to take an additional approximately 2 cm of proximal ureter.  This was set aside as a final ureteral margin with the final margin was inked.  I felt this was as high as I could  possibly go without compromising the left kidney.  A small cystotomy was then placed in the dome of the bladder and a heel stitch of 4-0 Vicryl was placed.  Then, a 6 x 24 Contour stent was placed over a sensor working wire using robotic guidance  and then ureteral to bladder dome anastomosis was performed using 2 separate running suture lines of 4-0 Vicryl.  This resulted in excellent tension-free apposition.  A 2-0 V-Loc was then used to bring the peritoneum overlying the bladder dome over the  anastomosis to help protect it and take further tension off of this.  Hemostasis was excellent.  All sponge and needle counts were correct.  We achieved the goals of surgery today.  A closed suction drain was brought through the previous  left lateral most robotic port site near the peritoneal cavity.  The previous right lateral most assistant port site was closed at the fascia using Carter-Thomason suture passer and 0 Vicryl.  The area of adhesiolysis was once again inspected and found  to be no obvious bowel injury and hemostasis appeared excellent.  Specimen was then retrieved by extending the previous camera port site inferiorly for a distance of approximately 3 cm, removing the left distal ureter specimen and setting aside for  permanent pathology.  Extraction site was then closed at the fascia using figure-of-eight PDS x3, followed by reapproximation of Scarpa's with running Vicryl.  All incision sites were infiltrated with dilute lipolyzed Marcaine and closed at the level of  skin using subcuticular Monocryl, followed by Dermabond.  Procedure was terminated.  The patient tolerated the procedure well.  No  immediate perioperative complications.  The patient was taken to the postoperative care unit in stable condition.  Plan for  inpatient admission.  Please note, first assistant, Debbrah Alar was crucial for all portions of the surgery today.  She provided invaluable retraction, specimen manipulation, vascular clipping, lymphatic clipping and general first assistance.  VN/NUANCE  D:12/06/2019 T:12/06/2019 JOB:[id number]

## 2019-12-07 NOTE — Progress Notes (Signed)
CRITICAL VALUE ALERT  Critical Value:  K 7.2  Date & Time Notied: 12/07/2019 0525 Provider Notified: Clint Lipps, MD Orders Received/Actions taken: MD made aware

## 2019-12-08 DIAGNOSIS — Z9221 Personal history of antineoplastic chemotherapy: Secondary | ICD-10-CM

## 2019-12-08 DIAGNOSIS — Z85068 Personal history of other malignant neoplasm of small intestine: Secondary | ICD-10-CM

## 2019-12-08 DIAGNOSIS — Z87891 Personal history of nicotine dependence: Secondary | ICD-10-CM

## 2019-12-08 DIAGNOSIS — Z923 Personal history of irradiation: Secondary | ICD-10-CM

## 2019-12-08 DIAGNOSIS — D4122 Neoplasm of uncertain behavior of left ureter: Secondary | ICD-10-CM

## 2019-12-08 LAB — BASIC METABOLIC PANEL
Anion gap: 5 (ref 5–15)
BUN: 26 mg/dL — ABNORMAL HIGH (ref 6–20)
CO2: 28 mmol/L (ref 22–32)
Calcium: 7.8 mg/dL — ABNORMAL LOW (ref 8.9–10.3)
Chloride: 106 mmol/L (ref 98–111)
Creatinine, Ser: 1.74 mg/dL — ABNORMAL HIGH (ref 0.61–1.24)
GFR calc Af Amer: 52 mL/min — ABNORMAL LOW (ref >60)
GFR calc non Af Amer: 45 mL/min — ABNORMAL LOW (ref >60)
Glucose, Bld: 134 mg/dL — ABNORMAL HIGH (ref 70–99)
Potassium: 4.6 mmol/L (ref 3.5–5.1)
Sodium: 139 mmol/L (ref 135–145)

## 2019-12-08 LAB — HEMOGLOBIN AND HEMATOCRIT, BLOOD
HCT: 25.4 % — ABNORMAL LOW (ref 39.0–52.0)
HCT: 29 % — ABNORMAL LOW (ref 39.0–52.0)
Hemoglobin: 8.3 g/dL — ABNORMAL LOW (ref 13.0–17.0)
Hemoglobin: 9.6 g/dL — ABNORMAL LOW (ref 13.0–17.0)

## 2019-12-08 LAB — CREATININE, FLUID (PLEURAL, PERITONEAL, JP DRAINAGE): Creat, Fluid: 1.7 mg/dL

## 2019-12-08 LAB — PREPARE RBC (CROSSMATCH)

## 2019-12-08 LAB — ABO/RH: ABO/RH(D): O POS

## 2019-12-08 MED ORDER — SODIUM CHLORIDE 0.9% IV SOLUTION: Freq: Once | INTRAVENOUS | Status: DC

## 2019-12-08 NOTE — Consult Note (Signed)
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 : I had a blockage.  HPI: [name] is well-known to me.  Is a very nice 49 year old [place] male.  We first started seeing him back in 2014.  At that time, he was found to have a abdominal mass.  He underwent surgical resection.  He was found to have a stage IIIc adenocarcinoma of the small bowel.  He underwent combination radiation and chemotherapy.  Recently, he had some routine follow-up.  He is having some abdominal discomfort.  He had a CT scan done on 08/16/2019.  The CT scan showed left hydronephrosis.  Is felt there was an obstructing distal left ureteral lesion.  He had a PET scan done on 11/07/2019.  It showed hyper metabolic lesion in the distal left ureter.  Everything else looked fine.  He subsequently underwent exploratory surgery on 12/06/2019.  He had resection of a tumor along with lymph nodes.  The pathology is not back yet.  Dr. Tresa Moore did a fantastic job with surgery.  Of note, the CEA level has always been normal.  Back in December the CEA level was 5.5.  Mr. [name] looks quite good.  He is eating.  He is out of bed.  He does have a indwelling Foley catheter.  There is no fever.  His labs today show a sodium of 139.  Potassium 4.6.  BUN is 26 creatinine 1.74.  His calcium is 7.8. His hemoglobin is 8.3 with hematocrit of 25.4.  I would have to say that his performance status is ECOG 1.    Past Medical History:  Diagnosis Date  . Cancer of small intestine, including duodenum ([place]) 10/26/2013   small intestine adenocarcinoma stage IIIc   . CHF (congestive heart failure) ([place])    patient denies  . Chronic hepatitis B ([place])   . Diabetes mellitus without complication ([place])    patient denies  . GERD (gastroesophageal reflux disease)    remote history  . Hydronephrosis of left  kidney   . Hypertension    no medications at this time  . Impaired fasting glucose   . Lung nodules    2 mm nodule in the posterior left lower lobe (4/115), stable. 4 mm ground-glass nodule in the apical segment right upper lobe (4/30), unchanged as well.  . Platelets decreased ([place])   . Radiation 01/31/14-03/06/14   upper abdomen/surgical bed  :  Past Surgical History:  Procedure Laterality Date  . ABDOMINAL EXPLORATION SURGERY  09/19/2013   done in [place]  . APPENDECTOMY  1997  . BACK SURGERY     tumor excision on back  . COLONOSCOPY    . CYSTOSCOPY WITH RETROGRADE PYELOGRAM, URETEROSCOPY AND STENT PLACEMENT Left 10/26/2019   Procedure: CYSTOSCOPY WITH LEFT RETROGRADE PYELOGRAM, URETEROSCOPY;  Surgeon: Franchot Gallo, MD;  Location: [facility];  Service: Urology;  Laterality: Left;  1 HR  . PANCREATICODUODENECTOMY  09/19/2013  . PARTIAL GASTRECTOMY  09/19/2013  . UPPER GI ENDOSCOPY    :   Current Facility-Administered Medications:  .  0.9 %  sodium chloride infusion, , Intravenous, Continuous, Alexis Frock, MD, Last Rate: 75 mL/hr at 12/07/19 1824, Rate Verify at 12/07/19 1824 .  Chlorhexidine Gluconate Cloth 2 % PADS 6 each, 6 each, Topical, Daily, Alexis Frock, MD, 6 each at 12/07/19 1850 .  diphenhydrAMINE (BENADRYL) injection 12.5-25 mg, 12.5-25 mg, Intravenous, Q6H PRN **  OR** diphenhydrAMINE (BENADRYL) 12.5 MG/5ML elixir 12.5-25 mg, 12.5-25 mg, Oral, Q6H PRN, Dancy, Amanda, PA-C .  docusate sodium (COLACE) capsule 100 mg, 100 mg, Oral, BID, Dancy, Amanda, PA-C, 100 mg at 12/07/19 2124 .  HYDROmorphone (DILAUDID) injection 0.5-1 mg, 0.5-1 mg, Intravenous, Q2H PRN, Dancy, Amanda, PA-C, 1 mg at 12/06/19 2231 .  lactated ringers infusion, , Intravenous, Continuous, Janeece Riggers, MD, Last Rate: 50 mL/hr at 12/07/19 1824, Rate Verify at 12/07/19 1824 .  lipase/protease/amylase (CREON) capsule 36,000 Units, 36,000 Units, Oral, TID WC, Debbrah Alar, PA-C, 36,000 Units at  12/07/19 [id number] .  neomycin-bacitracin-polymyxin (NEOSPORIN) ointment packet 1 application, 1 application, Topical, TID PRN, Dancy, Amanda, PA-C .  ondansetron (ZOFRAN) injection 4 mg, 4 mg, Intravenous, Q4H PRN, Dancy, Amanda, PA-C .  opium-belladonna (B&O SUPPRETTES) 16.2-60 MG suppository 1 suppository, 1 suppository, Rectal, Q6H PRN, Dancy, Amanda, PA-C .  oxyCODONE (Oxy IR/ROXICODONE) immediate release tablet 5 mg, 5 mg, Oral, Q4H PRN, Dancy, Amanda, PA-C:  . Chlorhexidine Gluconate Cloth  6 each Topical Daily  . docusate sodium  100 mg Oral BID  . lipase/protease/amylase  36,000 Units Oral TID WC  :  No Known Allergies:  Family History  Problem Relation Age of Onset  . Cancer Mother        Ureter Cancer  . Hypertension Other   . Colon cancer Sister 50  . Cancer Sister 20       colon  . Esophageal cancer Father 63  . Cancer Father        thyroid  :  Social History   Socioeconomic History  . Marital status: Married    Spouse name: [name]   . Number of children: 2  . Years of education: 37  . Highest education level: Not on file  Occupational History  . Occupation: TECHNICAL SUPPORT     Employer: [employer]    Comment: [employer]   Tobacco Use  . Smoking status: Former Smoker    Packs/day: 0.25    Years: 25.00    Pack years: 6.25    Types: Cigarettes    Start date: 09/07/1986    Quit date: 09/13/2013    Years since quitting: 6.2  . Smokeless tobacco: Never Used  . Tobacco comment: quit January of 2015  Substance and Sexual Activity  . Alcohol use: No    Alcohol/week: 0.0 standard drinks    Comment: He drinks rarely   . Drug use: No  . Sexual activity: Yes    Birth control/protection: Surgical    Comment: Wife had a BTL  Other Topics Concern  . Not on file  Social History Narrative   Marital Status: Married Kerrigan [place])   Children:  Son [name]), Daughter [name])    Pets: None   Living Situation: Lives with spouse, son and daughter    Occupation: Merchandiser, retail (Interior and spatial designer)   Education:  Curator)    Tobacco Use/Exposure:  He smokes 4-5 cigs per day.  He has smoked at least 20 years.     Alcohol Use:  Rarely    Exercise:  Limited    Hobbies: Music            Social Determinants of Health   Financial Resource Strain:   . Difficulty of Paying Living Expenses:   Food Insecurity:   . Worried About Charity fundraiser in the Last Year:   . Arboriculturist in the Last Year:   Transportation Needs:   .  Lack of Transportation (Medical):   [name] Lack of Transportation (Non-Medical):   Physical Activity:   . Days of Exercise per Week:   . Minutes of Exercise per Session:   Stress:   . Feeling of Stress :   Social Connections:   . Frequency of Communication with Friends and Family:   . Frequency of Social Gatherings with Friends and Family:   . Attends Religious Services:   . Active Member of Clubs or Organizations:   . Attends Archivist Meetings:   [name] Marital Status:   Intimate Partner Violence:   . Fear of Current or Ex-Partner:   . Emotionally Abused:   [name] Physically Abused:   . Sexually Abused:   :  Review of Systems  Constitutional: Negative.   HENT: Negative.   Eyes: Negative.   Respiratory: Negative.   Cardiovascular: Negative.   Gastrointestinal: Negative.   Genitourinary: Positive for hematuria.  Musculoskeletal: Negative.   Skin: Negative.   Neurological: Negative.   Endo/Heme/Allergies: Negative.   Psychiatric/Behavioral: Negative.      Exam: Physical Exam Vitals reviewed.  HENT:     Head: Normocephalic and atraumatic.  Eyes:     Pupils: Pupils are equal, round, and reactive to light.  Cardiovascular:     Rate and Rhythm: Normal rate and regular rhythm.     Heart sounds: Normal heart sounds.  Pulmonary:     Effort: Pulmonary effort is normal.     Breath sounds: Normal breath sounds.  Abdominal:     General: Bowel sounds are normal.     Palpations: Abdomen is  soft.     Comments: Abdominal exam shows healing laparoscopic scars.  The he has 6 scars.  They are healing.  There is no abdominal distention.  Bowel sounds are present.  There is no fluid wave.  There is no palpable abdominal mass.  There is no palpable liver or spleen tip.  Musculoskeletal:        General: No tenderness or deformity. Normal range of motion.     Cervical back: Normal range of motion.  Lymphadenopathy:     Cervical: No cervical adenopathy.  Skin:    General: Skin is warm and dry.     Findings: No erythema or rash.  Neurological:     Mental Status: He is alert and oriented to person, place, and time.  Psychiatric:        Behavior: Behavior normal.        Thought Content: Thought content normal.        Judgment: Judgment normal.       Physical Activity:   . Days of Exercise per Week:   . Minutes of Exercise per Session:      Patient Vitals for the past 24 hrs:  BP Temp Temp src Pulse Resp SpO2  12/08/19 0457 132/81 99.1 F (37.3 C) Oral 84 16 98 %  12/07/19 2043 (!) 141/91 98.7 F (37.1 C) Oral 77 18 99 %  12/07/19 1318 121/87 98 F (36.7 C) Oral 64 18 100 %  12/07/19 1000 128/86 98.2 F (36.8 C) Oral 62 20 100 %     Recent Labs    12/07/19 0442 12/08/19 0445  HGB 10.3* 8.3*  HCT 31.4* 25.4*   Recent Labs    12/07/19 1406 12/08/19 0445  NA 137 139  K 5.2* 4.6  CL 103 106  CO2 28 28  GLUCOSE 116* 134*  BUN 33* 26*  CREATININE 1.92* 1.74*  CALCIUM 8.0* 7.8*  Blood smear review: None  Pathology: Pending    Assessment and Plan: Mr.Poli is a very nice 49 year old [place] male.  He has a past history of stage IIIc small bowel adenocarcinoma.  He was treated with resection, chemotherapy and radiation therapy.  I would be shocked if this was a recurrence.  This would be unusual for a recurrence of a small bowel malignancy.  However, I would not be all that surprised.  I think it also would be hard to believe that he would have a second  cancer.  If this is truly a recurrence, then we will have to treat with chemotherapy.  We will have to send the tumor specimen off for molecular markers to see if we might be able to use immunotherapy or targeted therapy.  Regardless of what the primary is, I think he will need some form of chemotherapy.  We will await the results of pathology.  It sounds like Mr.Guevara will go home today.  He is certainly looks quite good.  Again, Dr. Tresa Moore did a fantastic job with surgery.  It is amazing he only has surgery a couple days ago.  I very much appreciate everybody's help on 4 E. with Mr.Neitzke.  I know he is very appreciative of all the care that he has gotten.  Lattie Haw, MD  Lurena Joiner 19:10

## 2019-12-08 NOTE — Progress Notes (Signed)
2 Days Post-Op   Subjective/Chief Complaint:  1 - Left Distal Ureteral Mass - s/p robotic LEFT distal ureterectomy / psoas hitch reimplant / pelvic node dissection 12/06/19. Path pending.  2 - Acute Blood Loss Anemia - Hgb 14's pre-op. Pt with h/o prior cytotoxic chemo and large area radiation. 4/1 Hgb 10.3. 4/2 Hgb 8.3 ==> 1upRBC.  Today "[name]" is stable. GFR improving. Tolerating PO intake. Hgb continues downtrend.    Objective: Vital signs in last 24 hours: Temp:  [98 F (36.7 C)-99.1 F (37.3 C)] 99.1 F (37.3 C) (04/02 0457) Pulse Rate:  [62-84] 84 (04/02 0457) Resp:  [16-20] 16 (04/02 0457) BP: (121-141)/(81-91) 132/81 (04/02 0457) SpO2:  [98 %-100 %] 98 % (04/02 0457) Last BM Date: 12/06/19  Intake/Output from previous day: 04/01 0701 - 04/02 0700 In: 1299.3 [P.O.:60; I.V.:1239.3] Out: 3235 [Urine:2600; Drains:635] Intake/Output this shift: No intake/output data recorded.  General appearance: alert, cooperative and extremely pleasant as always Eyes: negative Nose: Nares normal. Septum midline. Mucosa normal. No drainage or sinus tenderness. Throat: lips, mucosa, and tongue normal; teeth and gums normal Neck: supple, symmetrical, trachea midline Back: symmetric, no curvature. ROM normal. No CVA tenderness. Resp: non-labored on minimal [place] O2 Cardio: Nl rate GI: soft, non-tender; bowel sounds normal; no masses,  no organomegaly and recent port and extraction sites c/d/i. JP wtih non-foul serosanguinous fluid.  Male genitalia: normal, foley in place wtih copious clear non-foul urine.  Extremities: extremities normal, atraumatic, no cyanosis or edema Pulses: 2+ and symmetric Lymph nodes: Cervical, supraclavicular, and axillary nodes normal. Neurologic: Grossly normal  Lab Results:  Recent Labs    12/07/19 0442 12/08/19 0445  HGB 10.3* 8.3*  HCT 31.4* 25.4*   BMET Recent Labs    12/07/19 1406 12/08/19 0445  NA 137 139  K 5.2* 4.6  CL 103 106  CO2 28 28   GLUCOSE 116* 134*  BUN 33* 26*  CREATININE 1.92* 1.74*  CALCIUM 8.0* 7.8*   PT/INR No results for input(s): LABPROT, INR in the last 72 hours. ABG No results for input(s): PHART, HCO3 in the last 72 hours.  Invalid input(s): PCO2, PO2  Studies/Results: No results found.  Anti-infectives: Anti-infectives (From admission, onward)   Start     Dose/Rate Route Frequency Ordered Stop   12/06/19 1011  ceFAZolin (ANCEF) IVPB 2g/100 mL premix     2 g 200 mL/hr over 30 Minutes Intravenous 30 min pre-op 12/06/19 1011 12/06/19 1147   12/06/19 0000  sulfamethoxazole-trimethoprim (BACTRIM DS) 800-160 MG tablet     1 tablet Oral 2 times daily 12/06/19 1508        Assessment/Plan:  1 - Left Distal Ureteral Mass - no further cancer-directed therapy in house. Further therapy pending path results. Greatly appreciate Dr. Marin Olp comanagement.   2 - Acute Blood Loss Anemia - likely surigical blood loss +dilutional + somewhat compromised endogenous hematoposeisis. Transfuse 1 unit this AM, 2 on standby, recheck PM Hgb post-transfusion.  Remain in house until Hgb stability, at least another day.   Alexis Frock 12/08/2019

## 2019-12-09 LAB — BPAM RBC
Blood Product Expiration Date: 202104302359
ISSUE DATE / TIME: 202104021239
Unit Type and Rh: 5100

## 2019-12-09 LAB — CREATININE, FLUID (PLEURAL, PERITONEAL, JP DRAINAGE): Creat, Fluid: 1.4 mg/dL

## 2019-12-09 LAB — TYPE AND SCREEN
ABO/RH(D): O POS
Antibody Screen: NEGATIVE
Unit division: 0

## 2019-12-09 LAB — HEMOGLOBIN AND HEMATOCRIT, BLOOD
HCT: 27.8 % — ABNORMAL LOW (ref 39.0–52.0)
Hemoglobin: 9 g/dL — ABNORMAL LOW (ref 13.0–17.0)

## 2019-12-09 NOTE — Progress Notes (Signed)
3 Days Post-Op Subjective: Patient reports no significant pain.  He does have some swelling and funny feeling in his legs to the touch.  No fever, tolerating diet well.  Objective: Vital signs in last 24 hours: Temp:  [98.2 F (36.8 C)-99.3 F (37.4 C)] 99.1 F (37.3 C) (04/03 0505) Pulse Rate:  [71-79] 71 (04/03 0505) Resp:  [16-18] 18 (04/03 0505) BP: (123-135)/(82-91) 134/91 (04/03 0505) SpO2:  [99 %-100 %] 99 % (04/03 0505)  Intake/Output from previous day: 04/02 0701 - 04/03 0700 In: 660 [I.V.:250; Blood:320] Out: 2180 [Urine:1800; Drains:330] Intake/Output this shift: Total I/O In: -  Out: 100 [Drains:100]  Physical Exam:  Constitutional: Vital signs reviewed. WD WN in NAD   Eyes: PERRL, No scleral icterus.   Cardiovascular: RRR Pulmonary/Chest: Normal effort Abdominal: Soft.  Appropriately tender. Extremities: Minimal edema, but there is subcutaneous emphysema present bilaterally.  No tenderness.  No erythema.  Lab Results: Recent Labs    12/08/19 0445 12/08/19 1723 12/09/19 0501  HGB 8.3* 9.6* 9.0*  HCT 25.4* 29.0* 27.8*   BMET Recent Labs    12/07/19 1406 12/08/19 0445  NA 137 139  K 5.2* 4.6  CL 103 106  CO2 28 28  GLUCOSE 116* 134*  BUN 33* 26*  CREATININE 1.92* 1.74*  CALCIUM 8.0* 7.8*   No results for input(s): LABPT, INR in the last 72 hours. No results for input(s): LABURIN in the last 72 hours. Results for orders placed or performed during the hospital encounter of 12/02/19  SARS CORONAVIRUS 2 (TAT 6-24 HRS) Nasopharyngeal Nasopharyngeal Swab     Status: None   Collection Time: 12/02/19  1:20 PM   Specimen: Nasopharyngeal Swab  Result Value Ref Range Status   SARS Coronavirus 2 NEGATIVE NEGATIVE Final    Comment: (NOTE) SARS-CoV-2 target nucleic acids are NOT DETECTED. The SARS-CoV-2 RNA is generally detectable in upper and lower respiratory specimens during the acute phase of infection. Negative results do not preclude SARS-CoV-2  infection, do not rule out co-infections with other pathogens, and should not be used as the sole basis for treatment or other patient management decisions. Negative results must be combined with clinical observations, patient history, and epidemiological information. The expected result is Negative. Fact Sheet for Patients: SugarRoll.be Fact Sheet for Healthcare Providers: https://www.woods-mathews.com/ This test is not yet approved or cleared by the [place] FDA and  has been authorized for detection and/or diagnosis of SARS-CoV-2 by FDA under an Emergency Use Authorization (EUA). This EUA will remain  in effect (meaning this test can be used) for the duration of the COVID-19 declaration under Section 56 4(b)(1) of the Act, 21 U.S.C. section 360bbb-3(b)(1), unless the authorization is terminated or revoked sooner. Performed at [facility], [place] [address], [place], [place] [address]     Studies/Results: No results found.  Assessment/Plan:   Postop day 3 left distal ureterectomy.  Hemoglobin is slightly lower today.  He has increased JP drainage.  He does have bilateral lower extremity subcutaneous emphysema which I discussed as probably related to his pneumoperitoneum.    I will send his drainage again for creatinine.  Recheck hemoglobin in morning.   LOS: 3 days   Jorja Loa 12/09/2019, 12:13 PM

## 2019-12-10 LAB — HEMOGLOBIN AND HEMATOCRIT, BLOOD
HCT: 30.5 % — ABNORMAL LOW (ref 39.0–52.0)
Hemoglobin: 9.6 g/dL — ABNORMAL LOW (ref 13.0–17.0)

## 2019-12-10 NOTE — Discharge Summary (Signed)
Patient ID: Keith [name] MRN: [id number] DOB/AGE: 51-Oct-1972 51 y.o.  Admit date: 12/06/2019 Discharge date: 12/10/2019  Primary Care Physician:  Martinique, Betty G, MD  Discharge Diagnoses: Left ureteral obstruction   Consults: None   Discharge Medications: Allergies as of 12/10/2019   No Known Allergies     Medication List    STOP taking these medications   cephALEXin 500 MG capsule Commonly known as: Keflex     TAKE these medications   carboxymethylcellulose 0.5 % Soln Commonly known as: REFRESH PLUS Place 1 drop into both eyes 3 (three) times daily as needed (dry/irritated eyes.).   HYDROcodone-acetaminophen 5-325 MG tablet Commonly known as: Norco Take 1-2 tablets by mouth every 6 (six) hours as needed for moderate pain.   lipase/protease/amylase 36000 UNITS Cpep capsule Commonly known as: Creon Take 1 capsule (36,000 Units total) by mouth 3 (three) times daily with meals.   sulfamethoxazole-trimethoprim 800-160 MG tablet Commonly known as: BACTRIM DS Take 1 tablet by mouth 2 (two) times daily. Start the day prior to foley removal appointment        Significant Diagnostic Studies:  No results found.  Brief H and P: For complete details please refer to admission H and P, but in brief the patient has remote history of carcinoma of the small bowel.  He was discovered in late 2020 to have an obstruction of the left ureter with subsequent hydronephrosis.  He has an impassable mass in this distal ureter.  He presents for distal ureterectomy and reimplantation  Hospital Course: The patient tolerated his surgical procedure well.  He did have a downward trend to his hemoglobin which necessitated transfusion.  He maintained an adequate hemoglobin post transfusion.  He had a moderate amount of JP drainage which was found to be peritoneal fluid, not urine.  His JP drain was discontinued.  On postoperative day 4 he was discharged in improved condition. Active Problems:   Ureteral  obstruction   Day of Discharge BP 112/90 (BP Location: Right Arm)   Pulse 69   Temp 98.4 F (36.9 C) (Oral)   Resp 18   Ht 5\' 5"  (1.651 m)   Wt 55.3 kg   SpO2 99%   BMI 20.30 kg/m   Results for orders placed or performed during the hospital encounter of 12/06/19 (from the past 24 hour(s))  Creatinine, fluid (pleural, peritoneal, JP Drainage)     Status: None   Collection Time: 12/09/19 11:24 AM  Result Value Ref Range   Creat, Fluid 1.4 mg/dL   Fluid Type-FCRE JP Drainage   Hemoglobin and hematocrit, blood     Status: Abnormal   Collection Time: 12/10/19  5:25 AM  Result Value Ref Range   Hemoglobin 9.6 (L) 13.0 - 17.0 g/dL   HCT 30.5 (L) 39.0 - 52.0 %    Physical Exam: General: Alert and awake oriented x3 not in any acute distress. HEENT: anicteric sclera, pupils reactive to light and accommodation CVS: S1-S2 clear no murmur rubs or gallops Chest: clear to auscultation bilaterally, no wheezing rales or rhonchi Abdomen: soft nontender, nondistended, normal bowel sounds, no organomegaly Extremities: no cyanosis, clubbing or edema noted bilaterally Neuro: Cranial nerves II-XII intact, no focal neurological deficits  Disposition: Home with Foley catheter  Diet: No restrictions  Activity: Restrictions discussed with patient   Disposition and Follow-up:  Discharge Instructions    Care order/instruction   Complete by: As directed    OK to d/c JP drain   Diet general   Complete by:  As directed    Increase activity slowly   Complete by: As directed      His follow-up appointment is already scheduled  TESTS THAT NEED FOLLOW-UP  Pathology review  DISCHARGE FOLLOW-UP  Follow-up Information    Alexis Frock, MD On 12/19/2019.   Specialty: Urology Why: at 9:15 for X-Ray, MD visit, and catheter removal.  Contact information: [place] [address] [phone]           Time spent on Discharge:  10 minutes  Signed: Lillette Boxer  Melonie Germani 12/10/2019, 9:40 AM

## 2019-12-11 LAB — SURGICAL PATHOLOGY

## 2019-12-14 ENCOUNTER — Encounter: Payer: Self-pay | Admitting: *Deleted

## 2019-12-14 NOTE — Progress Notes (Signed)
[facility] sent on left ureteral cancer per order of Dr. Marin Olp.

## 2019-12-18 ENCOUNTER — Inpatient Hospital Stay: Payer: Managed Care, Other (non HMO)

## 2019-12-18 ENCOUNTER — Ambulatory Visit (HOSPITAL_BASED_OUTPATIENT_CLINIC_OR_DEPARTMENT_OTHER): Payer: Managed Care, Other (non HMO)

## 2019-12-20 ENCOUNTER — Inpatient Hospital Stay: Payer: Managed Care, Other (non HMO)

## 2019-12-20 ENCOUNTER — Inpatient Hospital Stay: Payer: Managed Care, Other (non HMO) | Attending: Hematology & Oncology | Admitting: Hematology & Oncology

## 2019-12-20 ENCOUNTER — Other Ambulatory Visit: Payer: Self-pay

## 2019-12-20 ENCOUNTER — Other Ambulatory Visit: Payer: Managed Care, Other (non HMO)

## 2019-12-20 ENCOUNTER — Encounter: Payer: Self-pay | Admitting: Hematology & Oncology

## 2019-12-20 DIAGNOSIS — Z923 Personal history of irradiation: Secondary | ICD-10-CM | POA: Insufficient documentation

## 2019-12-20 DIAGNOSIS — C689 Malignant neoplasm of urinary organ, unspecified: Secondary | ICD-10-CM

## 2019-12-20 DIAGNOSIS — Z85068 Personal history of other malignant neoplasm of small intestine: Secondary | ICD-10-CM | POA: Diagnosis not present

## 2019-12-20 DIAGNOSIS — C662 Malignant neoplasm of left ureter: Secondary | ICD-10-CM | POA: Diagnosis present

## 2019-12-20 DIAGNOSIS — C178 Malignant neoplasm of overlapping sites of small intestine: Secondary | ICD-10-CM

## 2019-12-20 DIAGNOSIS — Z9221 Personal history of antineoplastic chemotherapy: Secondary | ICD-10-CM | POA: Insufficient documentation

## 2019-12-20 HISTORY — DX: Malignant neoplasm of urinary organ, unspecified: C68.9

## 2019-12-20 LAB — CBC WITH DIFFERENTIAL (CANCER CENTER ONLY)
Abs Immature Granulocytes: 0.03 10*3/uL (ref 0.00–0.07)
Basophils Absolute: 0.1 10*3/uL (ref 0.0–0.1)
Basophils Relative: 1 %
Eosinophils Absolute: 0.5 10*3/uL (ref 0.0–0.5)
Eosinophils Relative: 9 %
HCT: 31.1 % — ABNORMAL LOW (ref 39.0–52.0)
Hemoglobin: 10.2 g/dL — ABNORMAL LOW (ref 13.0–17.0)
Immature Granulocytes: 1 %
Lymphocytes Relative: 18 %
Lymphs Abs: 1 10*3/uL (ref 0.7–4.0)
MCH: 29.9 pg (ref 26.0–34.0)
MCHC: 32.8 g/dL (ref 30.0–36.0)
MCV: 91.2 fL (ref 80.0–100.0)
Monocytes Absolute: 0.3 10*3/uL (ref 0.1–1.0)
Monocytes Relative: 6 %
Neutro Abs: 3.8 10*3/uL (ref 1.7–7.7)
Neutrophils Relative %: 65 %
Platelet Count: 248 10*3/uL (ref 150–400)
RBC: 3.41 MIL/uL — ABNORMAL LOW (ref 4.22–5.81)
RDW: 12.7 % (ref 11.5–15.5)
WBC Count: 5.7 10*3/uL (ref 4.0–10.5)
nRBC: 0 % (ref 0.0–0.2)

## 2019-12-20 LAB — CMP (CANCER CENTER ONLY)
ALT: 17 U/L (ref 0–44)
AST: 20 U/L (ref 15–41)
Albumin: 3.7 g/dL (ref 3.5–5.0)
Alkaline Phosphatase: 124 U/L (ref 38–126)
Anion gap: 5 (ref 5–15)
BUN: 25 mg/dL — ABNORMAL HIGH (ref 6–20)
CO2: 25 mmol/L (ref 22–32)
Calcium: 8.6 mg/dL — ABNORMAL LOW (ref 8.9–10.3)
Chloride: 107 mmol/L (ref 98–111)
Creatinine: 1.81 mg/dL — ABNORMAL HIGH (ref 0.61–1.24)
GFR, Est AFR Am: 49 mL/min — ABNORMAL LOW (ref >60)
GFR, Estimated: 43 mL/min — ABNORMAL LOW (ref >60)
Glucose, Bld: 177 mg/dL — ABNORMAL HIGH (ref 70–99)
Potassium: 4.4 mmol/L (ref 3.5–5.1)
Sodium: 137 mmol/L (ref 135–145)
Total Bilirubin: 0.5 mg/dL (ref 0.3–1.2)
Total Protein: 5.8 g/dL — ABNORMAL LOW (ref 6.5–8.1)

## 2019-12-20 LAB — CEA (IN HOUSE-CHCC): CEA (CHCC-In House): 3.56 ng/mL (ref 0.00–5.00)

## 2019-12-20 NOTE — Progress Notes (Signed)
Hematology and Oncology Follow Up Visit  Keith [name] [id number] 10/29/1970 49 y.o. 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 Daily     Interim History:  [name] is back for followup.  Unfortunately, he does have a new malignancy.  I suppose this actually is better than him having metastatic small bowel cancer.  We actually found this malignancy by luck.  He had a CT scan that was done as a routine and there is some hydronephrosis.  Ultimately, he had undergo a robotic laparoscopic procedure.  This was on 12/06/2019.  He had resection of a tumor in the left ureter.  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  It invaded the muscularis propria.  There was tumor at the margin.  It was high-grade.  5 lymph nodes were negative.  As such, this is a stage II (T2N0M0) urothelial carcinoma.  He had surgery couple weeks ago.  He had to looks quite good.  He has no catheters in him.  I do believe that this will warrant adjuvant chemotherapy.  By the NCCN guidelines, there is a recommendation for adjuvant chemotherapy.  Is hard to say if he still has residual disease in him.  The pathology report does show that there is tumor at the margin of the surgery.  He is having no problems with urine or with bowels.  His appetite is picking up.  He has had no issues with nausea or vomiting.  He has had no cough.  He has had no leg swelling.  Thankfully, his wife came in with him today.  Currently, his performance status is ECOG 1.    Medications:  Current Outpatient Medications:  .  carboxymethylcellulose (REFRESH PLUS) 0.5 % SOLN, Place 1 drop into both eyes 3 (three)  times daily as needed (dry/irritated eyes.). , Disp: , Rfl:  .  lipase/protease/amylase (CREON) 36000 UNITS CPEP capsule, Take 1 capsule (36,000 Units total) by mouth 3 (three) times daily with meals., Disp: 90 capsule, Rfl: 0 .  sulfamethoxazole-trimethoprim (BACTRIM DS) 800-160 MG tablet, Take 1 tablet by mouth 2 (two) times daily. Start the day prior to foley removal appointment, Disp: 6 tablet, Rfl: 0  Allergies: No Known Allergies  Past Medical History, Surgical history, Social history, and Family History were reviewed and updated.  Review of Systems: Review of Systems  Constitutional: Negative.   HENT: Negative.  Negative for hearing loss.   Eyes: Negative.   Respiratory: Negative.   Cardiovascular: Negative.   Gastrointestinal: Negative.   Genitourinary: Negative.   Musculoskeletal: Negative.   Skin: Negative.   Neurological: Negative.   Endo/Heme/Allergies: Negative.   Psychiatric/Behavioral: Negative.      Physical Exam:  height is 5\' 6"  (1.676 m) and weight is 115 lb 6.4 oz (52.3 kg). His temporal temperature is 97.5 F (36.4 C) (abnormal). His blood pressure is 95/64 and his pulse is 66. His respiration is 18 and oxygen saturation is 100%.   Physical Exam Vitals reviewed.  HENT:     Head: Normocephalic and atraumatic.  Eyes:     Pupils: Pupils are equal, round, and reactive to light.  Cardiovascular:     Rate and Rhythm: Normal rate and regular rhythm.  Heart sounds: Normal heart sounds.  Pulmonary:     Effort: Pulmonary effort is normal.     Breath sounds: Normal breath sounds.  Abdominal:     General: Bowel sounds are normal.     Palpations: Abdomen is soft.     Comments: Abdominal exam shows the laparoscopic wounds that are healing.  There are 6 of them.  There is no fluid wave.  There is no abdominal distention.  There is no guarding or rebound tenderness.  There is no abdominal mass.  There is no palpable liver or spleen tip.  Musculoskeletal:         General: No tenderness or deformity. Normal range of motion.     Cervical back: Normal range of motion.  Lymphadenopathy:     Cervical: No cervical adenopathy.  Skin:    General: Skin is warm and dry.     Findings: No erythema or rash.  Neurological:     Mental Status: He is alert and oriented to person, place, and time.  Psychiatric:        Behavior: Behavior normal.        Thought Content: Thought content normal.        Judgment: Judgment normal.      Lab Results  Component Value Date   WBC 5.7 12/20/2019   HGB 10.2 (L) 12/20/2019   HCT 31.1 (L) 12/20/2019   MCV 91.2 12/20/2019   PLT 248 12/20/2019     Chemistry      Component Value Date/Time   NA 137 12/20/2019 0811   NA 147 (H) 08/06/2017 0816   NA 144 02/04/2017 0758   K 4.4 12/20/2019 0811   K 4.6 08/06/2017 0816   K 4.6 02/04/2017 0758   CL 107 12/20/2019 0811   CL 104 08/06/2017 0816   CO2 25 12/20/2019 0811   CO2 30 08/06/2017 0816   CO2 25 02/04/2017 0758   BUN 25 (H) 12/20/2019 0811   BUN 12 08/06/2017 0816   BUN 14.2 02/04/2017 0758   CREATININE 1.81 (H) 12/20/2019 0811   CREATININE 0.9 08/06/2017 0816   CREATININE 0.9 02/04/2017 0758      Component Value Date/Time   CALCIUM 8.6 (L) 12/20/2019 0811   CALCIUM 9.0 08/06/2017 0816   CALCIUM 8.9 02/04/2017 0758   ALKPHOS 124 12/20/2019 0811   ALKPHOS 108 (H) 08/06/2017 0816   ALKPHOS 113 02/04/2017 0758   AST 20 12/20/2019 0811   AST 24 02/04/2017 0758   ALT 17 12/20/2019 0811   ALT 27 08/06/2017 0816   ALT 20 02/04/2017 0758   BILITOT 0.5 12/20/2019 0811   BILITOT 0.91 02/04/2017 0758         Impression and Plan: Keith [name] is 49 year old [place] gentleman. He has a locally advanced small bowel cancer. He underwent resection. He has 9 positive lymph nodes. As such, he is at risk for recurrence. He underwent aggressive adjuvant therapy with both chemotherapy and radiation therapy. He completed his back in October 2015.  He now has a new  malignancy.  He has a LEFT ureteral papillary carcinoma.  This is stage II.  Again, I do think that this warrants adjuvant chemotherapy.  This is high-grade.  He has a margin that is positive.  I think that cis-platinum/gemcitabine would be the standard approach.  I talked to he and his wife about this.  I gave him information sheets about treatment.  I think that he would need 4 cycles of treatment.  I think this  would be reasonable.  I think with adjuvant chemotherapy, we can decrease the risk of recurrence to less than 15%.  I spent about an hour with Keith [name] and his wife.  I answered all their questions.  We will start treatment the first week in May.  I will see him back when he has his first treatment just to make sure he is doing okay.     Volanda Napoleon, MD 4/14/20212:51 PM

## 2019-12-20 NOTE — Progress Notes (Signed)
START OFF PATHWAY REGIMEN - Bladder   [id number]:Gemcitabine + Cisplatin every 21 days:   A cycle is every 21 days:     Gemcitabine      Cisplatin   **Always confirm dose/schedule in your pharmacy ordering system**  Patient Characteristics: Post-Cystectomy without Neoadjuvant Therapy (Pathologic Staging), pT0-2, pN0, M0 Therapeutic Status: Post-Cystectomy without Neoadjuvant Therapy (Pathologic Staging) AJCC M Category: cM0 AJCC 8 Stage Grouping: II AJCC T Category: pT2b AJCC N Category: pN0 Intent of Therapy: Curative Intent, Not Discussed with Patient

## 2019-12-29 ENCOUNTER — Other Ambulatory Visit: Payer: Self-pay | Admitting: *Deleted

## 2019-12-29 ENCOUNTER — Encounter: Payer: Self-pay | Admitting: Hematology & Oncology

## 2020-01-04 NOTE — Progress Notes (Signed)
Pharmacist Chemotherapy Monitoring - Initial Assessment    Anticipated start date:  01/11/20      Regimen:  . Are orders appropriate based on the patient's diagnosis, regimen, and cycle? Yes . Does the plan date match the patient's scheduled date? Yes . Is the sequencing of drugs appropriate? Yes . Are the premedications appropriate for the patient's regimen? Yes . Prior Authorization for treatment is: Approved o If applicable, is the correct biosimilar selected based on the patient's insurance? not applicable  Organ Function and Labs: [name] Are dose adjustments needed based on the patient's renal function, hepatic function, or hematologic function? Yes . Are appropriate labs ordered prior to the start of patient's treatment? Yes . Other organ system assessment, if indicated: N/A . The following baseline labs, if indicated, have been ordered: cisplatin: K, Mg  Dose Assessment: . Are the drug doses appropriate? Yes  Will be evaluated on day of treatment. . Are the following correct: o Drug concentrations Yes o IV fluid compatible with drug Yes  o Timing of therapy Yes . If applicable, does the patient have documented access for treatment and/or plans for port-a-cath placement?  . If applicable, have lifetime cumulative doses been properly documented and assessed? not applicable Lifetime Dose Tracking  . Oxaliplatin: 924.347 mg/m2 (1,525 mg) = 154.06 % of the maximum lifetime dose of 600 mg/m2  o   Toxicity Monitoring/Prevention: . The patient has the following take home antiemetics prescribed: Ondansetron, Prochlorperazine, Dexamethasone and Lorazepam . The patient has the following take home medications prescribed: N/A . Medication allergies and previous infusion related reactions, if applicable, have been reviewed and addressed. Yes . The patient's current medication list has been assessed for drug-drug interactions with their chemotherapy regimen. no significant drug-drug interactions  were identified on review.  Order Review: . Are the treatment plan orders signed? Yes . Is the patient scheduled to see a provider prior to their treatment? Yes  I verify that I have reviewed each item in the above checklist and answered each question accordingly.  Tyshun Tuckerman, Jacqlyn Larsen 01/04/2020 3:41 PM

## 2020-01-11 ENCOUNTER — Inpatient Hospital Stay: Payer: Managed Care, Other (non HMO) | Attending: Hematology & Oncology

## 2020-01-11 ENCOUNTER — Encounter: Payer: Self-pay | Admitting: Hematology & Oncology

## 2020-01-11 ENCOUNTER — Other Ambulatory Visit: Payer: Self-pay

## 2020-01-11 ENCOUNTER — Inpatient Hospital Stay (HOSPITAL_BASED_OUTPATIENT_CLINIC_OR_DEPARTMENT_OTHER): Payer: Managed Care, Other (non HMO) | Admitting: Hematology & Oncology

## 2020-01-11 ENCOUNTER — Inpatient Hospital Stay: Payer: Managed Care, Other (non HMO)

## 2020-01-11 VITALS — BP 133/90 | HR 68 | Temp 97.8°F | Resp 18 | Wt 116.0 lb

## 2020-01-11 DIAGNOSIS — C178 Malignant neoplasm of overlapping sites of small intestine: Secondary | ICD-10-CM

## 2020-01-11 DIAGNOSIS — C689 Malignant neoplasm of urinary organ, unspecified: Secondary | ICD-10-CM | POA: Diagnosis not present

## 2020-01-11 DIAGNOSIS — Z85068 Personal history of other malignant neoplasm of small intestine: Secondary | ICD-10-CM | POA: Diagnosis not present

## 2020-01-11 DIAGNOSIS — Z9221 Personal history of antineoplastic chemotherapy: Secondary | ICD-10-CM | POA: Insufficient documentation

## 2020-01-11 DIAGNOSIS — C662 Malignant neoplasm of left ureter: Secondary | ICD-10-CM | POA: Diagnosis present

## 2020-01-11 DIAGNOSIS — Z5111 Encounter for antineoplastic chemotherapy: Secondary | ICD-10-CM | POA: Diagnosis not present

## 2020-01-11 DIAGNOSIS — Z923 Personal history of irradiation: Secondary | ICD-10-CM | POA: Diagnosis not present

## 2020-01-11 LAB — CMP (CANCER CENTER ONLY)
ALT: 13 U/L (ref 0–44)
AST: 18 U/L (ref 15–41)
Albumin: 4 g/dL (ref 3.5–5.0)
Alkaline Phosphatase: 129 U/L — ABNORMAL HIGH (ref 38–126)
Anion gap: 5 (ref 5–15)
BUN: 12 mg/dL (ref 6–20)
CO2: 26 mmol/L (ref 22–32)
Calcium: 9.1 mg/dL (ref 8.9–10.3)
Chloride: 109 mmol/L (ref 98–111)
Creatinine: 1.6 mg/dL — ABNORMAL HIGH (ref 0.61–1.24)
GFR, Est AFR Am: 57 mL/min — ABNORMAL LOW (ref >60)
GFR, Estimated: 49 mL/min — ABNORMAL LOW (ref >60)
Glucose, Bld: 142 mg/dL — ABNORMAL HIGH (ref 70–99)
Potassium: 4.2 mmol/L (ref 3.5–5.1)
Sodium: 140 mmol/L (ref 135–145)
Total Bilirubin: 0.5 mg/dL (ref 0.3–1.2)
Total Protein: 6.3 g/dL — ABNORMAL LOW (ref 6.5–8.1)

## 2020-01-11 LAB — CBC WITH DIFFERENTIAL (CANCER CENTER ONLY)
Abs Immature Granulocytes: 0.01 10*3/uL (ref 0.00–0.07)
Basophils Absolute: 0 10*3/uL (ref 0.0–0.1)
Basophils Relative: 0 %
Eosinophils Absolute: 0.4 10*3/uL (ref 0.0–0.5)
Eosinophils Relative: 8 %
HCT: 36.2 % — ABNORMAL LOW (ref 39.0–52.0)
Hemoglobin: 11.8 g/dL — ABNORMAL LOW (ref 13.0–17.0)
Immature Granulocytes: 0 %
Lymphocytes Relative: 38 %
Lymphs Abs: 1.6 10*3/uL (ref 0.7–4.0)
MCH: 29.7 pg (ref 26.0–34.0)
MCHC: 32.6 g/dL (ref 30.0–36.0)
MCV: 91.2 fL (ref 80.0–100.0)
Monocytes Absolute: 0.3 10*3/uL (ref 0.1–1.0)
Monocytes Relative: 6 %
Neutro Abs: 2 10*3/uL (ref 1.7–7.7)
Neutrophils Relative %: 48 %
Platelet Count: 136 10*3/uL — ABNORMAL LOW (ref 150–400)
RBC: 3.97 MIL/uL — ABNORMAL LOW (ref 4.22–5.81)
RDW: 12.9 % (ref 11.5–15.5)
WBC Count: 4.2 10*3/uL (ref 4.0–10.5)
nRBC: 0 % (ref 0.0–0.2)

## 2020-01-11 MED ORDER — PROCHLORPERAZINE MALEATE 10 MG PO TABS
10.0000 mg | ORAL_TABLET | Freq: Once | ORAL | Status: AC
  Administered 2020-01-11: 10 mg via ORAL

## 2020-01-11 MED ORDER — SODIUM CHLORIDE 0.9 % IV SOLN
Freq: Once | INTRAVENOUS | Status: DC
  Filled 2020-01-11: qty 250

## 2020-01-11 MED ORDER — SODIUM CHLORIDE 0.9 % IV SOLN
1600.0000 mg | Freq: Once | INTRAVENOUS | Status: AC
  Administered 2020-01-11: 1600 mg via INTRAVENOUS
  Filled 2020-01-11: qty 26.3

## 2020-01-11 MED ORDER — PROCHLORPERAZINE MALEATE 10 MG PO TABS
ORAL_TABLET | ORAL | Status: AC
  Filled 2020-01-11: qty 1

## 2020-01-11 MED ORDER — SODIUM CHLORIDE 0.9 % IV SOLN
Freq: Once | INTRAVENOUS | Status: AC
  Filled 2020-01-11: qty 250

## 2020-01-11 NOTE — Patient Instructions (Signed)
Gemcitabine injection What is this medicine? GEMCITABINE (jem SYE ta been) is a chemotherapy drug. This medicine is used to treat many types of cancer like breast cancer, lung cancer, pancreatic cancer, and ovarian cancer. This medicine may be used for other purposes; ask your health care provider or pharmacist if you have questions. COMMON BRAND NAME(S): Gemzar, Infugem What should I tell my health care provider before I take this medicine? They need to know if you have any of these conditions:  blood disorders  infection  kidney disease  liver disease  lung or breathing disease, like asthma  recent or ongoing radiation therapy  an unusual or allergic reaction to gemcitabine, other chemotherapy, other medicines, foods, dyes, or preservatives  pregnant or trying to get pregnant  breast-feeding How should I use this medicine? This drug is given as an infusion into a vein. It is administered in a hospital or clinic by a specially trained health care professional. Talk to your pediatrician regarding the use of this medicine in children. Special care may be needed. Overdosage: If you think you have taken too much of this medicine contact a poison control center or emergency room at once. NOTE: This medicine is only for you. Do not share this medicine with others. What if I miss a dose? It is important not to miss your dose. Call your doctor or health care professional if you are unable to keep an appointment. What may interact with this medicine?  medicines to increase blood counts like filgrastim, pegfilgrastim, sargramostim  some other chemotherapy drugs like cisplatin  vaccines Talk to your doctor or health care professional before taking any of these medicines:  acetaminophen  aspirin  ibuprofen  ketoprofen  naproxen This list may not describe all possible interactions. Give your health care provider a list of all the medicines, herbs, non-prescription drugs, or  dietary supplements you use. Also tell them if you smoke, drink alcohol, or use illegal drugs. Some items may interact with your medicine. What should I watch for while using this medicine? Visit your doctor for checks on your progress. This drug may make you feel generally unwell. This is not uncommon, as chemotherapy can affect healthy cells as well as cancer cells. Report any side effects. Continue your course of treatment even though you feel ill unless your doctor tells you to stop. In some cases, you may be given additional medicines to help with side effects. Follow all directions for their use. Call your doctor or health care professional for advice if you get a fever, chills or sore throat, or other symptoms of a cold or flu. Do not treat yourself. This drug decreases your body's ability to fight infections. Try to avoid being around people who are sick. This medicine may increase your risk to bruise or bleed. Call your doctor or health care professional if you notice any unusual bleeding. Be careful brushing and flossing your teeth or using a toothpick because you may get an infection or bleed more easily. If you have any dental work done, tell your dentist you are receiving this medicine. Avoid taking products that contain aspirin, acetaminophen, ibuprofen, naproxen, or ketoprofen unless instructed by your doctor. These medicines may hide a fever. Do not become pregnant while taking this medicine or for 6 months after stopping it. Women should inform their doctor if they wish to become pregnant or think they might be pregnant. Men should not father a child while taking this medicine and for 3 months after stopping it.   There is a potential for serious side effects to an unborn child. Talk to your health care professional or pharmacist for more information. Do not breast-feed an infant while taking this medicine or for at least 1 week after stopping it. Men should inform their doctors if they wish  to father a child. This medicine may lower sperm counts. Talk with your doctor or health care professional if you are concerned about your fertility. What side effects may I notice from receiving this medicine? Side effects that you should report to your doctor or health care professional as soon as possible:  allergic reactions like skin rash, itching or hives, swelling of the face, lips, or tongue  breathing problems  pain, redness, or irritation at site where injected  signs and symptoms of a dangerous change in heartbeat or heart rhythm like chest pain; dizziness; fast or irregular heartbeat; palpitations; feeling faint or lightheaded, falls; breathing problems  signs of decreased platelets or bleeding - bruising, pinpoint red spots on the skin, black, tarry stools, blood in the urine  signs of decreased red blood cells - unusually weak or tired, feeling faint or lightheaded, falls  signs of infection - fever or chills, cough, sore throat, pain or difficulty passing urine  signs and symptoms of kidney injury like trouble passing urine or change in the amount of urine  signs and symptoms of liver injury like dark yellow or brown urine; general ill feeling or flu-like symptoms; light-colored stools; loss of appetite; nausea; right upper belly pain; unusually weak or tired; yellowing of the eyes or skin  swelling of ankles, feet, hands Side effects that usually do not require medical attention (report to your doctor or health care professional if they continue or are bothersome):  constipation  diarrhea  hair loss  loss of appetite  nausea  rash  vomiting This list may not describe all possible side effects. Call your doctor for medical advice about side effects. You may report side effects to FDA at 1-800-FDA-1088. Where should I keep my medicine? This drug is given in a hospital or clinic and will not be stored at home. NOTE: This sheet is a summary. It may not cover all  possible information. If you have questions about this medicine, talk to your doctor, pharmacist, or health care provider.  2020 Elsevier/Gold Standard (2017-11-17 18:06:11) Prochlorperazine tablets What is this medicine? PROCHLORPERAZINE (proe klor PER a zeen) helps to control severe nausea and vomiting. This medicine is also used to treat schizophrenia. It can also help patients who experience anxiety that is not due to psychological illness. This medicine may be used for other purposes; ask your health care provider or pharmacist if you have questions. COMMON BRAND NAME(S): Compazine What should I tell my health care provider before I take this medicine? They need to know if you have any of these conditions:  blockage in your bowel  brain tumor  dementia  diabetes  difficulty swallowing  glaucoma  have trouble controlling your muscles  head injury  heart disease  history of irregular heartbeat  if you often drink alcohol  liver disease  low blood counts, like low white cell, platelet, or red cell counts  low blood pressure  lung or breathing disease, like asthma  Parkinson's disease  prostate disease  seizures  trouble passing urine  an unusual or allergic reaction to prochlorperazine, other medicines, foods, dyes, or preservatives  pregnant or trying to get pregnant  breast-feeding How should I use this medicine? Take  this medicine by mouth with a glass of water. Follow the directions on the prescription label. Take your doses at regular intervals. Do not take your medicine more often than directed. Do not stop taking this medicine suddenly. This can cause nausea, vomiting, and dizziness. Ask your doctor or health care professional for advice. Talk to your pediatrician regarding the use of this medicine in children. Special care may be needed. While this drug may be prescribed for children as young as 2 years for selected conditions, precautions do  apply. Overdosage: If you think you have taken too much of this medicine contact a poison control center or emergency room at once. NOTE: This medicine is only for you. Do not share this medicine with others. What if I miss a dose? If you miss a dose, take it as soon as you can. If it is almost time for your next dose, take only that dose. Do not take double or extra doses. What may interact with this medicine? Do not take this medicine with any of the following medications:  cisapride  dofetilide  dronedarone  metoclopramide  pimozide  saquinavir  thioridazine This medicine may also interact with the following medications:  alcohol  antihistamines for allergy, cough, and cold  atropine  certain medicines for anxiety or sleep  certain medicines for bladder problems like oxybutynin, tolterodine  certain medicines for depression like amitriptyline, fluoxetine, sertraline  certain medicines for Parkinson's disease like benztropine, trihexyphenidyl  certain medicines for stomach problems like dicyclomine, hyoscyamine  certain medicines for travel sickness like scopolamine  epinephrine  general anesthetics like halothane, isoflurane, methoxyflurane, propofol  ipratropium  lithium  medicines for high blood pressure  medicines for seizures like phenobarbital, primidone, phenytoin  medicines that relax muscles for surgery  narcotic medicines for pain  propranolol  warfarin This list may not describe all possible interactions. Give your health care provider a list of all the medicines, herbs, non-prescription drugs, or dietary supplements you use. Also tell them if you smoke, drink alcohol, or use illegal drugs. Some items may interact with your medicine. What should I watch for while using this medicine? Visit your health care professional for regular checks on your progress. Tell your health care professional if symptoms do not start to get better or if they get  worse. You may get drowsy or dizzy. Do not drive, use machinery, or do anything that needs mental alertness until you know how this medicine affects you. Do not stand or sit up quickly, especially if you are an older patient. This reduces the risk of dizzy or fainting spells. Alcohol may interfere with the effect of this medicine. Avoid alcoholic drinks. This drug can cause problems with controlling your body temperature. It can lower the response of your body to cold temperatures. If possible, stay indoors during cold weather. If you must go outdoors, wear warm clothes. It can also lower the response of your body to heat. Do not overheat. Do not over-exercise. Stay out of the sun when possible. If you must be in the sun, wear cool clothing. Drink plenty of water. If you have trouble controlling your body temperature, call your health care provider right away. This medicine may increase blood sugar. Ask your health care provider if changes in diet or medicines are needed if you have diabetes. This medicine can make you more sensitive to the sun. Keep out of the sun. If you cannot avoid being in the sun, wear protective clothing and use sunscreen. Do not  use sun lamps or tanning beds/booths. Your mouth may get dry. Chewing sugarless gum or sucking hard candy, and drinking plenty of water may help. Contact your doctor if the problem does not go away or is severe. What side effects may I notice from receiving this medicine? Side effects that you should report to your doctor or health care professional as soon as possible:  allergic reactions like skin rash, itching or hives, swelling of the face, lips, or tongue  abnormal production of milk  breast enlargement in both males and females  changes in vision  chest pain  confusion  fast, irregular heartbeat  fever, chills, sore throat  seizures  signs and symptoms of high blood sugar such as being more thirsty or hungry or having to urinate more  than normal. You may also feel very tired or have blurry vision.  signs and symptoms of liver injury like dark yellow or brown urine; general ill feeling or flu-like symptoms; light-colored stools; loss of appetite; nausea; right upper belly pain; unusually weak or tired; yellowing of the eyes or skin  signs and symptoms of low blood pressure like dizziness; feeling faint or lightheaded, falls; unusually weak or tired  trouble passing urine or change in the amount of urine  trouble swallowing  uncontrollable movements of the arms, face, head, mouth, neck, or upper body  unusual bruising or bleeding  unusually weak or tired Side effects that usually do not require medical attention (report to your doctor or health care professional if they continue or are bothersome):  constipation  drowsiness  dry mouth This list may not describe all possible side effects. Call your doctor for medical advice about side effects. You may report side effects to FDA at 1-800-FDA-1088. Where should I keep my medicine? Keep out of the reach of children. Store at room temperature between 15 and 30 degrees C (59 and 86 degrees F). Protect from light. Throw away any unused medicine after the expiration date. NOTE: This sheet is a summary. It may not cover all possible information. If you have questions about this medicine, talk to your doctor, pharmacist, or health care provider.  2020 Elsevier/Gold Standard (2019-07-04 16:15:24)

## 2020-01-11 NOTE — Progress Notes (Signed)
Hematology and Oncology Follow Up Visit  Keith [name] [id number] 1971/02/25 49 y.o. 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 Daily     Interim History:  Mr.  Keith [name] is back for followup.  It looks good.  He feels okay.  He has had all of his catheter taken out.  He is happy about this.  The good news is that we did get the molecular markers back.  The tumor does have a HIGH MSI level.  There is a very high TMB level.  I just wonder if this is not some type of hereditary syndrome.  He has had 2 unusual cancers.  He has had small bowel adenocarcinoma and ureteral cancer.  I just wonder if this may not be some Lynch syndrome type of situation.  I think we can learn here is that his children will definitely need to be screened at an early stage.  I would probably have them evaluated whether 51 years old.  He is ready for adjuvant chemotherapy.  I still believe adjuvant chemotherapy is the best option for [place] in this situation.  I think this is the recommended option per NCCN guidelines.  He has had a good appetite.  There is been no nausea or vomiting.  He has had no cough.  He has had no rashes.  There is been no leg swelling.  Overall, his performance status is ECOG 1.    Medications:  Current Outpatient Medications:  .  carboxymethylcellulose (REFRESH PLUS) 0.5 % SOLN, Place 1 drop into both eyes 3 (three) times daily as needed (dry/irritated eyes.). , Disp: , Rfl:  .  lipase/protease/amylase (CREON) 36000 UNITS CPEP capsule, Take 1 capsule (36,000 Units total) by mouth 3 (three) times daily with meals., Disp: 90 capsule, Rfl: 0  Allergies: No Known Allergies  Past Medical History, Surgical history, Social history,  and Family History were reviewed and updated.  Review of Systems: Review of Systems  Constitutional: Negative.   HENT: Negative.  Negative for hearing loss.   Eyes: Negative.   Respiratory: Negative.   Cardiovascular: Negative.   Gastrointestinal: Negative.   Genitourinary: Negative.   Musculoskeletal: Negative.   Skin: Negative.   Neurological: Negative.   Endo/Heme/Allergies: Negative.   Psychiatric/Behavioral: Negative.      Physical Exam:  weight is 116 lb (52.6 kg). His temporal temperature is 97.8 F (36.6 C). His blood pressure is 133/90 and his pulse is 68. His respiration is 18 and oxygen saturation is 100%.   Physical Exam Vitals reviewed.  HENT:     Head: Normocephalic and atraumatic.  Eyes:     Pupils: Pupils are equal, round, and reactive to light.  Cardiovascular:     Rate and Rhythm: Normal rate and regular rhythm.     Heart sounds: Normal heart sounds.  Pulmonary:     Effort: Pulmonary effort is normal.     Breath sounds: Normal breath sounds.  Abdominal:     General: Bowel sounds are normal.     Palpations: Abdomen is soft.     Comments: Abdominal exam shows the laparoscopic wounds that are healing.  There are 6 of them.  There is no fluid wave.  There is no abdominal distention.  There is no guarding or rebound tenderness.  There is no abdominal mass.  There is no palpable liver or spleen tip.  Musculoskeletal:        General: No tenderness or deformity. Normal range of motion.     Cervical back: Normal range of motion.  Lymphadenopathy:     Cervical: No cervical adenopathy.  Skin:    General: Skin is warm and dry.     Findings: No erythema or rash.  Neurological:     Mental Status: He is alert and oriented to person, place, and time.  Psychiatric:        Behavior: Behavior normal.        Thought Content: Thought content normal.        Judgment: Judgment normal.      Lab Results  Component Value Date   WBC 4.2 01/11/2020   HGB 11.8 (L)  01/11/2020   HCT 36.2 (L) 01/11/2020   MCV 91.2 01/11/2020   PLT 136 (L) 01/11/2020     Chemistry      Component Value Date/Time   NA 140 01/11/2020 1323   NA 147 (H) 08/06/2017 0816   NA 144 02/04/2017 0758   K 4.2 01/11/2020 1323   K 4.6 08/06/2017 0816   K 4.6 02/04/2017 0758   CL 109 01/11/2020 1323   CL 104 08/06/2017 0816   CO2 26 01/11/2020 1323   CO2 30 08/06/2017 0816   CO2 25 02/04/2017 0758   BUN 12 01/11/2020 1323   BUN 12 08/06/2017 0816   BUN 14.2 02/04/2017 0758   CREATININE 1.60 (H) 01/11/2020 1323   CREATININE 0.9 08/06/2017 0816   CREATININE 0.9 02/04/2017 0758      Component Value Date/Time   CALCIUM 9.1 01/11/2020 1323   CALCIUM 9.0 08/06/2017 0816   CALCIUM 8.9 02/04/2017 0758   ALKPHOS 129 (H) 01/11/2020 1323   ALKPHOS 108 (H) 08/06/2017 0816   ALKPHOS 113 02/04/2017 0758   AST 18 01/11/2020 1323   AST 24 02/04/2017 0758   ALT 13 01/11/2020 1323   ALT 27 08/06/2017 0816   ALT 20 02/04/2017 0758   BILITOT 0.5 01/11/2020 1323   BILITOT 0.91 02/04/2017 0758      Impression and Plan: [name] is 49 year old [place] gentleman. He has a locally advanced small bowel cancer. He underwent resection. He has 9 positive lymph nodes. As such, he is at risk for recurrence. He underwent aggressive adjuvant therapy with both chemotherapy and radiation therapy. He completed his back in October 2015.  He now has a new malignancy.  He has a LEFT ureteral papillary carcinoma.  This is stage II.  I do not believe that this is some type of hereditary cancer syndrome.  He does have the high MSI and high TMB levels.  We will proceed with chemotherapy.  We will get 4 cycles of cis-platinum/gemcitabine.  We will plan to get her back to see [place] in another 3 weeks when he gets his second cycle of treatment.    Volanda Napoleon, MD 5/6/20212:03 PM

## 2020-01-12 ENCOUNTER — Inpatient Hospital Stay: Payer: Managed Care, Other (non HMO)

## 2020-01-12 VITALS — BP 120/83 | HR 63 | Temp 97.0°F | Resp 18

## 2020-01-12 DIAGNOSIS — C689 Malignant neoplasm of urinary organ, unspecified: Secondary | ICD-10-CM

## 2020-01-12 DIAGNOSIS — Z5111 Encounter for antineoplastic chemotherapy: Secondary | ICD-10-CM | POA: Diagnosis not present

## 2020-01-12 MED ORDER — SODIUM CHLORIDE 0.9 % IV SOLN
10.0000 mg | Freq: Once | INTRAVENOUS | Status: AC
  Administered 2020-01-12: 10 mg via INTRAVENOUS
  Filled 2020-01-12: qty 1

## 2020-01-12 MED ORDER — SODIUM CHLORIDE 0.9 % IV SOLN
52.0000 mg/m2 | Freq: Once | INTRAVENOUS | Status: AC
  Administered 2020-01-12: 81 mg via INTRAVENOUS
  Filled 2020-01-12: qty 81

## 2020-01-12 MED ORDER — PALONOSETRON HCL INJECTION 0.25 MG/5ML
0.2500 mg | Freq: Once | INTRAVENOUS | Status: AC
  Administered 2020-01-12: 0.25 mg via INTRAVENOUS

## 2020-01-12 MED ORDER — SODIUM CHLORIDE 0.9 % IV SOLN
Freq: Once | INTRAVENOUS | Status: AC
  Filled 2020-01-12: qty 250

## 2020-01-12 MED ORDER — POTASSIUM CHLORIDE 2 MEQ/ML IV SOLN
Freq: Once | INTRAVENOUS | Status: AC
  Filled 2020-01-12: qty 10

## 2020-01-12 MED ORDER — SODIUM CHLORIDE 0.9 % IV SOLN
150.0000 mg | Freq: Once | INTRAVENOUS | Status: AC
  Administered 2020-01-12: 150 mg via INTRAVENOUS
  Filled 2020-01-12: qty 5

## 2020-01-12 MED ORDER — PALONOSETRON HCL INJECTION 0.25 MG/5ML
INTRAVENOUS | Status: AC
  Filled 2020-01-12: qty 5

## 2020-01-12 NOTE — Progress Notes (Signed)
Decrease cisplatin by 25% for CrCl = 81mL/min.  Ok per Dr Marin Olp

## 2020-01-12 NOTE — Patient Instructions (Signed)

## 2020-01-15 NOTE — Progress Notes (Signed)
Pharmacist Chemotherapy Monitoring - Follow Up Assessment    I verify that I have reviewed each item in the below checklist:  . Regimen for the patient is scheduled for the appropriate day and plan matches scheduled date. [name] Appropriate non-routine labs are ordered dependent on drug ordered. . If applicable, additional medications reviewed and ordered per protocol based on lifetime cumulative doses and/or treatment regimen.   Plan for follow-up and/or issues identified: No . I-vent associated with next due treatment: No . MD and/or nursing notified: No  Kimberely Mccannon, Jacqlyn Larsen 01/15/2020 1:34 PM

## 2020-01-17 ENCOUNTER — Other Ambulatory Visit: Payer: Self-pay

## 2020-01-17 DIAGNOSIS — C689 Malignant neoplasm of urinary organ, unspecified: Secondary | ICD-10-CM

## 2020-01-18 ENCOUNTER — Other Ambulatory Visit: Payer: Self-pay

## 2020-01-18 ENCOUNTER — Inpatient Hospital Stay: Payer: Managed Care, Other (non HMO)

## 2020-01-18 DIAGNOSIS — C689 Malignant neoplasm of urinary organ, unspecified: Secondary | ICD-10-CM

## 2020-01-18 DIAGNOSIS — Z5111 Encounter for antineoplastic chemotherapy: Secondary | ICD-10-CM | POA: Diagnosis not present

## 2020-01-18 LAB — CBC WITH DIFFERENTIAL (CANCER CENTER ONLY)
Abs Immature Granulocytes: 0 10*3/uL (ref 0.00–0.07)
Basophils Absolute: 0 10*3/uL (ref 0.0–0.1)
Basophils Relative: 1 %
Eosinophils Absolute: 0.1 10*3/uL (ref 0.0–0.5)
Eosinophils Relative: 6 %
HCT: 38.1 % — ABNORMAL LOW (ref 39.0–52.0)
Hemoglobin: 12.3 g/dL — ABNORMAL LOW (ref 13.0–17.0)
Immature Granulocytes: 0 %
Lymphocytes Relative: 54 %
Lymphs Abs: 1.3 10*3/uL (ref 0.7–4.0)
MCH: 29.7 pg (ref 26.0–34.0)
MCHC: 32.3 g/dL (ref 30.0–36.0)
MCV: 92 fL (ref 80.0–100.0)
Monocytes Absolute: 0.1 10*3/uL (ref 0.1–1.0)
Monocytes Relative: 3 %
Neutro Abs: 0.8 10*3/uL — ABNORMAL LOW (ref 1.7–7.7)
Neutrophils Relative %: 36 %
Platelet Count: 105 10*3/uL — ABNORMAL LOW (ref 150–400)
RBC: 4.14 MIL/uL — ABNORMAL LOW (ref 4.22–5.81)
RDW: 12.4 % (ref 11.5–15.5)
WBC Count: 2.3 10*3/uL — ABNORMAL LOW (ref 4.0–10.5)
nRBC: 0 % (ref 0.0–0.2)

## 2020-01-18 LAB — CMP (CANCER CENTER ONLY)
ALT: 22 U/L (ref 0–44)
AST: 18 U/L (ref 15–41)
Albumin: 4 g/dL (ref 3.5–5.0)
Alkaline Phosphatase: 101 U/L (ref 38–126)
Anion gap: 5 (ref 5–15)
BUN: 15 mg/dL (ref 6–20)
CO2: 31 mmol/L (ref 22–32)
Calcium: 9.3 mg/dL (ref 8.9–10.3)
Chloride: 105 mmol/L (ref 98–111)
Creatinine: 1.57 mg/dL — ABNORMAL HIGH (ref 0.61–1.24)
GFR, Est AFR Am: 59 mL/min — ABNORMAL LOW (ref >60)
GFR, Estimated: 51 mL/min — ABNORMAL LOW (ref >60)
Glucose, Bld: 154 mg/dL — ABNORMAL HIGH (ref 70–99)
Potassium: 5.5 mmol/L — ABNORMAL HIGH (ref 3.5–5.1)
Sodium: 141 mmol/L (ref 135–145)
Total Bilirubin: 0.4 mg/dL (ref 0.3–1.2)
Total Protein: 6.2 g/dL — ABNORMAL LOW (ref 6.5–8.1)

## 2020-01-18 MED ORDER — PROCHLORPERAZINE MALEATE 10 MG PO TABS
ORAL_TABLET | ORAL | Status: AC
  Filled 2020-01-18: qty 1

## 2020-01-18 NOTE — Progress Notes (Signed)
Labs reviewed by MD, no treatment due to [facility]

## 2020-01-18 NOTE — Patient Instructions (Signed)
No treatment due to counts

## 2020-01-26 NOTE — Progress Notes (Signed)
Pharmacist Chemotherapy Monitoring - Follow Up Assessment    I verify that I have reviewed each item in the below checklist:  . Regimen for the patient is scheduled for the appropriate day and plan matches scheduled date. [name] Appropriate non-routine labs are ordered dependent on drug ordered. . If applicable, additional medications reviewed and ordered per protocol based on lifetime cumulative doses and/or treatment regimen.   Plan for follow-up and/or issues identified: No . I-vent associated with next due treatment: No . MD and/or nursing notified: No  Halcomb, Jacqlyn Larsen 01/26/2020 11:39 AM

## 2020-02-01 ENCOUNTER — Inpatient Hospital Stay: Payer: Managed Care, Other (non HMO)

## 2020-02-01 ENCOUNTER — Other Ambulatory Visit: Payer: Self-pay

## 2020-02-01 ENCOUNTER — Inpatient Hospital Stay (HOSPITAL_BASED_OUTPATIENT_CLINIC_OR_DEPARTMENT_OTHER): Payer: Managed Care, Other (non HMO) | Admitting: Family

## 2020-02-01 ENCOUNTER — Encounter: Payer: Self-pay | Admitting: Family

## 2020-02-01 VITALS — BP 118/86 | HR 53 | Temp 97.6°F | Resp 18 | Ht 66.0 in | Wt 115.0 lb

## 2020-02-01 DIAGNOSIS — C178 Malignant neoplasm of overlapping sites of small intestine: Secondary | ICD-10-CM

## 2020-02-01 DIAGNOSIS — C689 Malignant neoplasm of urinary organ, unspecified: Secondary | ICD-10-CM

## 2020-02-01 DIAGNOSIS — Z5111 Encounter for antineoplastic chemotherapy: Secondary | ICD-10-CM | POA: Diagnosis not present

## 2020-02-01 LAB — CMP (CANCER CENTER ONLY)
ALT: 23 U/L (ref 0–44)
AST: 26 U/L (ref 15–41)
Albumin: 3.9 g/dL (ref 3.5–5.0)
Alkaline Phosphatase: 113 U/L (ref 38–126)
Anion gap: 6 (ref 5–15)
BUN: 17 mg/dL (ref 6–20)
CO2: 25 mmol/L (ref 22–32)
Calcium: 8.6 mg/dL — ABNORMAL LOW (ref 8.9–10.3)
Chloride: 111 mmol/L (ref 98–111)
Creatinine: 1.52 mg/dL — ABNORMAL HIGH (ref 0.61–1.24)
GFR, Est AFR Am: >60 mL/min (ref >60)
GFR, Estimated: 53 mL/min — ABNORMAL LOW (ref >60)
Glucose, Bld: 228 mg/dL — ABNORMAL HIGH (ref 70–99)
Potassium: 4.5 mmol/L (ref 3.5–5.1)
Sodium: 142 mmol/L (ref 135–145)
Total Bilirubin: 0.4 mg/dL (ref 0.3–1.2)
Total Protein: 6.2 g/dL — ABNORMAL LOW (ref 6.5–8.1)

## 2020-02-01 LAB — CBC WITH DIFFERENTIAL (CANCER CENTER ONLY)
Abs Immature Granulocytes: 0.01 10*3/uL (ref 0.00–0.07)
Basophils Absolute: 0 10*3/uL (ref 0.0–0.1)
Basophils Relative: 0 %
Eosinophils Absolute: 0.1 10*3/uL (ref 0.0–0.5)
Eosinophils Relative: 3 %
HCT: 34 % — ABNORMAL LOW (ref 39.0–52.0)
Hemoglobin: 11.2 g/dL — ABNORMAL LOW (ref 13.0–17.0)
Immature Granulocytes: 0 %
Lymphocytes Relative: 36 %
Lymphs Abs: 1 10*3/uL (ref 0.7–4.0)
MCH: 30.3 pg (ref 26.0–34.0)
MCHC: 32.9 g/dL (ref 30.0–36.0)
MCV: 91.9 fL (ref 80.0–100.0)
Monocytes Absolute: 0.3 10*3/uL (ref 0.1–1.0)
Monocytes Relative: 10 %
Neutro Abs: 1.3 10*3/uL — ABNORMAL LOW (ref 1.7–7.7)
Neutrophils Relative %: 51 %
Platelet Count: 189 10*3/uL (ref 150–400)
RBC: 3.7 MIL/uL — ABNORMAL LOW (ref 4.22–5.81)
RDW: 13.7 % (ref 11.5–15.5)
WBC Count: 2.6 10*3/uL — ABNORMAL LOW (ref 4.0–10.5)
nRBC: 0 % (ref 0.0–0.2)

## 2020-02-01 LAB — MAGNESIUM: Magnesium: 2 mg/dL (ref 1.7–2.4)

## 2020-02-01 MED ORDER — PROCHLORPERAZINE MALEATE 10 MG PO TABS
10.0000 mg | ORAL_TABLET | Freq: Once | ORAL | Status: AC
  Administered 2020-02-01: 10 mg via ORAL

## 2020-02-01 MED ORDER — PROCHLORPERAZINE MALEATE 10 MG PO TABS
ORAL_TABLET | ORAL | Status: AC
  Filled 2020-02-01: qty 1

## 2020-02-01 MED ORDER — SODIUM CHLORIDE 0.9 % IV SOLN
Freq: Once | INTRAVENOUS | Status: DC
  Filled 2020-02-01: qty 250

## 2020-02-01 MED ORDER — SODIUM CHLORIDE 0.9 % IV SOLN
Freq: Once | INTRAVENOUS | Status: AC
  Filled 2020-02-01: qty 250

## 2020-02-01 MED ORDER — SODIUM CHLORIDE 0.9 % IV SOLN
1600.0000 mg | Freq: Once | INTRAVENOUS | Status: AC
  Administered 2020-02-01: 1600 mg via INTRAVENOUS
  Filled 2020-02-01: qty 26.3

## 2020-02-01 NOTE — Progress Notes (Signed)
Hematology and Oncology Follow Up Visit  Keith [name] [id number] Jul 05, 1971 49 y.o. 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 Daily   Interim History:  Keith [name] is here today for follow-up and treatment. He is doing well and has no complaints at this time.  His platelet count has recovered to 189, Hgb 11.2 and WBC count 2.6.  He still has some mild fullness in the left groin area. He states that this has been present for over a month. No pain or redness.  No episodes of blood loss noted. No bruising or petechiae.  No fever, chills, n/v, cough, rash, dizziness, SOB, chest pain, palpitations, abdominal pain or changes in bowel or bladder habits.  He has a little diarrhea on occasion.  No swelling, tenderness, numbness or tingling in his extremities.  No falls or syncope.  He has maintained a good appetite and is staying well hydrated. His weight is stable.   ECOG Performance Status: 1 - Symptomatic but completely ambulatory  Medications:  Allergies as of 02/01/2020   No Known Allergies     Medication List       Accurate as of Feb 01, 2020  2:18 PM. If you have any questions, ask your nurse or doctor.        carboxymethylcellulose 0.5 % Soln Commonly known as: REFRESH PLUS Place 1 drop into both eyes 3 (three) times daily as needed (dry/irritated eyes.).   lipase/protease/amylase 36000 UNITS Cpep capsule Commonly known as: Creon Take 1 capsule (36,000 Units total) by mouth 3 (three) times daily with meals.       Allergies: No Known Allergies  Past Medical History, Surgical history, Social history, and Family History were reviewed and updated.  Review of Systems: All other 10  point review of systems is negative.   Physical Exam:  height is _0  (1.676 m) and weight is 115 lb (52.2 kg). His temporal temperature is 97.6 F (36.4 C). His blood pressure is 118/86 and his pulse is 53 (abnormal). His respiration is 18 and oxygen saturation is 100%.   Wt Readings from Last 3 Encounters:  02/01/20 115 lb (52.2 kg)  01/11/20 116 lb (52.6 kg)  12/20/19 115 lb 6.4 oz (52.3 kg)    Ocular: Sclerae unicteric, pupils equal, round and reactive to light Ear-nose-throat: Oropharynx clear, dentition fair Lymphatic: No cervical or supraclavicular adenopathy Lungs no rales or rhonchi, good excursion bilaterally Heart regular rate and rhythm, no murmur appreciated Abd soft, nontender, positive bowel sounds, no liver or spleen tip palpated on exam, no fluid wave  MSK no focal spinal tenderness, no joint edema Neuro: non-focal, well-oriented, appropriate affect Breasts: Deferred    Lab Results  Component Value Date   WBC 2.6 (L) 02/01/2020   HGB 11.2 (L) 02/01/2020   HCT 34.0 (L) 02/01/2020   MCV 91.9 02/01/2020   PLT 189 02/01/2020   Lab Results  Component Value Date   FERRITIN 85 09/18/2019   IRON 95 09/18/2019   TIBC 331 09/18/2019   UIBC 237 09/18/2019   IRONPCTSAT 29 09/18/2019   Lab Results  Component Value Date   RBC 3.70 (L) 02/01/2020   No results found for: KPAFRELGTCHN, LAMBDASER,  KAPLAMBRATIO No results found for: IGGSERUM, IGA, IGMSERUM No results found for: Kathrynn Ducking, MSPIKE, SPEI   Chemistry      Component Value Date/Time   NA 142 02/01/2020 1120   NA 147 (H) 08/06/2017 0816   NA 144 02/04/2017 0758   K 4.5 02/01/2020 1120   K 4.6 08/06/2017 0816   K 4.6 02/04/2017 0758   CL 111 02/01/2020 1120   CL 104 08/06/2017 0816   CO2 25 02/01/2020 1120   CO2 30 08/06/2017 0816   CO2 25 02/04/2017 0758   BUN 17 02/01/2020 1120   BUN 12 08/06/2017 0816   BUN 14.2 02/04/2017 0758   CREATININE 1.52  (H) 02/01/2020 1120   CREATININE 0.9 08/06/2017 0816   CREATININE 0.9 02/04/2017 0758      Component Value Date/Time   CALCIUM 8.6 (L) 02/01/2020 1120   CALCIUM 9.0 08/06/2017 0816   CALCIUM 8.9 02/04/2017 0758   ALKPHOS 113 02/01/2020 1120   ALKPHOS 108 (H) 08/06/2017 0816   ALKPHOS 113 02/04/2017 0758   AST 26 02/01/2020 1120   AST 24 02/04/2017 0758   ALT 23 02/01/2020 1120   ALT 27 08/06/2017 0816   ALT 20 02/04/2017 0758   BILITOT 0.4 02/01/2020 1120   BILITOT 0.91 02/04/2017 0758       Impression and Plan: Keith [name] is 49 yo [place] gentleman with history of locally advanced small bowel cancer. He underwent resection and had 9 positive lymph nodes putting him at  risk for recurrence. He underwent aggressive adjuvant therapy with both chemotherapy and radiation therapy completed in October 2015. 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 He has high MSI and high TMB levels. CEA last month was down to 3.56.  We will plan to complete 4 cycles of cis-platinum/gemcitabine. We will proceed with cycle 2 of treatment today as planned per Dr. Marin Olp.  We will plan to see him again in another 3 weeks.  He promises to contact our office with any questions or concerns. We can certainly see him sooner if needed.   Laverna Peace, NP 5/27/20212:18 PM

## 2020-02-01 NOTE — Progress Notes (Signed)
CBC has been reviewed by Dr. Marin Olp today. Okay to treat.

## 2020-02-01 NOTE — Patient Instructions (Signed)
Gemcitabine injection What is this medicine? GEMCITABINE (jem SYE ta been) is a chemotherapy drug. This medicine is used to treat many types of cancer like breast cancer, lung cancer, pancreatic cancer, and ovarian cancer. This medicine may be used for other purposes; ask your health care provider or pharmacist if you have questions. COMMON BRAND NAME(S): Gemzar, Infugem What should I tell my health care provider before I take this medicine? They need to know if you have any of these conditions:  blood disorders  infection  kidney disease  liver disease  lung or breathing disease, like asthma  recent or ongoing radiation therapy  an unusual or allergic reaction to gemcitabine, other chemotherapy, other medicines, foods, dyes, or preservatives  pregnant or trying to get pregnant  breast-feeding How should I use this medicine? This drug is given as an infusion into a vein. It is administered in a hospital or clinic by a specially trained health care professional. Talk to your pediatrician regarding the use of this medicine in children. Special care may be needed. Overdosage: If you think you have taken too much of this medicine contact a poison control center or emergency room at once. NOTE: This medicine is only for you. Do not share this medicine with others. What if I miss a dose? It is important not to miss your dose. Call your doctor or health care professional if you are unable to keep an appointment. What may interact with this medicine?  medicines to increase blood counts like filgrastim, pegfilgrastim, sargramostim  some other chemotherapy drugs like cisplatin  vaccines Talk to your doctor or health care professional before taking any of these medicines:  acetaminophen  aspirin  ibuprofen  ketoprofen  naproxen This list may not describe all possible interactions. Give your health care provider a list of all the medicines, herbs, non-prescription drugs, or  dietary supplements you use. Also tell them if you smoke, drink alcohol, or use illegal drugs. Some items may interact with your medicine. What should I watch for while using this medicine? Visit your doctor for checks on your progress. This drug may make you feel generally unwell. This is not uncommon, as chemotherapy can affect healthy cells as well as cancer cells. Report any side effects. Continue your course of treatment even though you feel ill unless your doctor tells you to stop. In some cases, you may be given additional medicines to help with side effects. Follow all directions for their use. Call your doctor or health care professional for advice if you get a fever, chills or sore throat, or other symptoms of a cold or flu. Do not treat yourself. This drug decreases your body's ability to fight infections. Try to avoid being around people who are sick. This medicine may increase your risk to bruise or bleed. Call your doctor or health care professional if you notice any unusual bleeding. Be careful brushing and flossing your teeth or using a toothpick because you may get an infection or bleed more easily. If you have any dental work done, tell your dentist you are receiving this medicine. Avoid taking products that contain aspirin, acetaminophen, ibuprofen, naproxen, or ketoprofen unless instructed by your doctor. These medicines may hide a fever. Do not become pregnant while taking this medicine or for 6 months after stopping it. Women should inform their doctor if they wish to become pregnant or think they might be pregnant. Men should not father a child while taking this medicine and for 3 months after stopping it.   There is a potential for serious side effects to an unborn child. Talk to your health care professional or pharmacist for more information. Do not breast-feed an infant while taking this medicine or for at least 1 week after stopping it. Men should inform their doctors if they wish  to father a child. This medicine may lower sperm counts. Talk with your doctor or health care professional if you are concerned about your fertility. What side effects may I notice from receiving this medicine? Side effects that you should report to your doctor or health care professional as soon as possible:  allergic reactions like skin rash, itching or hives, swelling of the face, lips, or tongue  breathing problems  pain, redness, or irritation at site where injected  signs and symptoms of a dangerous change in heartbeat or heart rhythm like chest pain; dizziness; fast or irregular heartbeat; palpitations; feeling faint or lightheaded, falls; breathing problems  signs of decreased platelets or bleeding - bruising, pinpoint red spots on the skin, black, tarry stools, blood in the urine  signs of decreased red blood cells - unusually weak or tired, feeling faint or lightheaded, falls  signs of infection - fever or chills, cough, sore throat, pain or difficulty passing urine  signs and symptoms of kidney injury like trouble passing urine or change in the amount of urine  signs and symptoms of liver injury like dark yellow or brown urine; general ill feeling or flu-like symptoms; light-colored stools; loss of appetite; nausea; right upper belly pain; unusually weak or tired; yellowing of the eyes or skin  swelling of ankles, feet, hands Side effects that usually do not require medical attention (report to your doctor or health care professional if they continue or are bothersome):  constipation  diarrhea  hair loss  loss of appetite  nausea  rash  vomiting This list may not describe all possible side effects. Call your doctor for medical advice about side effects. You may report side effects to FDA at 1-800-FDA-1088. Where should I keep my medicine? This drug is given in a hospital or clinic and will not be stored at home. NOTE: This sheet is a summary. It may not cover all  possible information. If you have questions about this medicine, talk to your doctor, pharmacist, or health care provider.  2020 Elsevier/Gold Standard (2017-11-17 18:06:11) Prochlorperazine tablets What is this medicine? PROCHLORPERAZINE (proe klor PER a zeen) helps to control severe nausea and vomiting. This medicine is also used to treat schizophrenia. It can also help patients who experience anxiety that is not due to psychological illness. This medicine may be used for other purposes; ask your health care provider or pharmacist if you have questions. COMMON BRAND NAME(S): Compazine What should I tell my health care provider before I take this medicine? They need to know if you have any of these conditions:  blockage in your bowel  brain tumor  dementia  diabetes  difficulty swallowing  glaucoma  have trouble controlling your muscles  head injury  heart disease  history of irregular heartbeat  if you often drink alcohol  liver disease  low blood counts, like low white cell, platelet, or red cell counts  low blood pressure  lung or breathing disease, like asthma  Parkinson's disease  prostate disease  seizures  trouble passing urine  an unusual or allergic reaction to prochlorperazine, other medicines, foods, dyes, or preservatives  pregnant or trying to get pregnant  breast-feeding How should I use this medicine? Take  this medicine by mouth with a glass of water. Follow the directions on the prescription label. Take your doses at regular intervals. Do not take your medicine more often than directed. Do not stop taking this medicine suddenly. This can cause nausea, vomiting, and dizziness. Ask your doctor or health care professional for advice. Talk to your pediatrician regarding the use of this medicine in children. Special care may be needed. While this drug may be prescribed for children as young as 2 years for selected conditions, precautions do  apply. Overdosage: If you think you have taken too much of this medicine contact a poison control center or emergency room at once. NOTE: This medicine is only for you. Do not share this medicine with others. What if I miss a dose? If you miss a dose, take it as soon as you can. If it is almost time for your next dose, take only that dose. Do not take double or extra doses. What may interact with this medicine? Do not take this medicine with any of the following medications:  cisapride  dofetilide  dronedarone  metoclopramide  pimozide  saquinavir  thioridazine This medicine may also interact with the following medications:  alcohol  antihistamines for allergy, cough, and cold  atropine  certain medicines for anxiety or sleep  certain medicines for bladder problems like oxybutynin, tolterodine  certain medicines for depression like amitriptyline, fluoxetine, sertraline  certain medicines for Parkinson's disease like benztropine, trihexyphenidyl  certain medicines for stomach problems like dicyclomine, hyoscyamine  certain medicines for travel sickness like scopolamine  epinephrine  general anesthetics like halothane, isoflurane, methoxyflurane, propofol  ipratropium  lithium  medicines for high blood pressure  medicines for seizures like phenobarbital, primidone, phenytoin  medicines that relax muscles for surgery  narcotic medicines for pain  propranolol  warfarin This list may not describe all possible interactions. Give your health care provider a list of all the medicines, herbs, non-prescription drugs, or dietary supplements you use. Also tell them if you smoke, drink alcohol, or use illegal drugs. Some items may interact with your medicine. What should I watch for while using this medicine? Visit your health care professional for regular checks on your progress. Tell your health care professional if symptoms do not start to get better or if they get  worse. You may get drowsy or dizzy. Do not drive, use machinery, or do anything that needs mental alertness until you know how this medicine affects you. Do not stand or sit up quickly, especially if you are an older patient. This reduces the risk of dizzy or fainting spells. Alcohol may interfere with the effect of this medicine. Avoid alcoholic drinks. This drug can cause problems with controlling your body temperature. It can lower the response of your body to cold temperatures. If possible, stay indoors during cold weather. If you must go outdoors, wear warm clothes. It can also lower the response of your body to heat. Do not overheat. Do not over-exercise. Stay out of the sun when possible. If you must be in the sun, wear cool clothing. Drink plenty of water. If you have trouble controlling your body temperature, call your health care provider right away. This medicine may increase blood sugar. Ask your health care provider if changes in diet or medicines are needed if you have diabetes. This medicine can make you more sensitive to the sun. Keep out of the sun. If you cannot avoid being in the sun, wear protective clothing and use sunscreen. Do not  use sun lamps or tanning beds/booths. Your mouth may get dry. Chewing sugarless gum or sucking hard candy, and drinking plenty of water may help. Contact your doctor if the problem does not go away or is severe. What side effects may I notice from receiving this medicine? Side effects that you should report to your doctor or health care professional as soon as possible:  allergic reactions like skin rash, itching or hives, swelling of the face, lips, or tongue  abnormal production of milk  breast enlargement in both males and females  changes in vision  chest pain  confusion  fast, irregular heartbeat  fever, chills, sore throat  seizures  signs and symptoms of high blood sugar such as being more thirsty or hungry or having to urinate more  than normal. You may also feel very tired or have blurry vision.  signs and symptoms of liver injury like dark yellow or brown urine; general ill feeling or flu-like symptoms; light-colored stools; loss of appetite; nausea; right upper belly pain; unusually weak or tired; yellowing of the eyes or skin  signs and symptoms of low blood pressure like dizziness; feeling faint or lightheaded, falls; unusually weak or tired  trouble passing urine or change in the amount of urine  trouble swallowing  uncontrollable movements of the arms, face, head, mouth, neck, or upper body  unusual bruising or bleeding  unusually weak or tired Side effects that usually do not require medical attention (report to your doctor or health care professional if they continue or are bothersome):  constipation  drowsiness  dry mouth This list may not describe all possible side effects. Call your doctor for medical advice about side effects. You may report side effects to FDA at 1-800-FDA-1088. Where should I keep my medicine? Keep out of the reach of children. Store at room temperature between 15 and 30 degrees C (59 and 86 degrees F). Protect from light. Throw away any unused medicine after the expiration date. NOTE: This sheet is a summary. It may not cover all possible information. If you have questions about this medicine, talk to your doctor, pharmacist, or health care provider.  2020 Elsevier/Gold Standard (2019-07-04 16:15:24)

## 2020-02-01 NOTE — Addendum Note (Signed)
Addended by: Burney Gauze R on: 02/01/2020 03:07 PM   Modules accepted: Orders

## 2020-02-02 ENCOUNTER — Inpatient Hospital Stay: Payer: Managed Care, Other (non HMO)

## 2020-02-02 VITALS — BP 108/68 | HR 61 | Temp 97.8°F | Resp 17

## 2020-02-02 DIAGNOSIS — Z5111 Encounter for antineoplastic chemotherapy: Secondary | ICD-10-CM | POA: Diagnosis not present

## 2020-02-02 DIAGNOSIS — C689 Malignant neoplasm of urinary organ, unspecified: Secondary | ICD-10-CM

## 2020-02-02 LAB — LACTATE DEHYDROGENASE: LDH: 181 U/L (ref 98–192)

## 2020-02-02 MED ORDER — SODIUM CHLORIDE 0.9 % IV SOLN
70.0000 mg/m2 | Freq: Once | INTRAVENOUS | Status: AC
  Administered 2020-02-02: 109 mg via INTRAVENOUS
  Filled 2020-02-02: qty 100

## 2020-02-02 MED ORDER — SODIUM CHLORIDE 0.9 % IV SOLN
150.0000 mg | Freq: Once | INTRAVENOUS | Status: AC
  Administered 2020-02-02: 150 mg via INTRAVENOUS
  Filled 2020-02-02: qty 150

## 2020-02-02 MED ORDER — SODIUM CHLORIDE 0.9 % IV SOLN
Freq: Once | INTRAVENOUS | Status: AC
  Filled 2020-02-02: qty 250

## 2020-02-02 MED ORDER — SODIUM CHLORIDE 0.9 % IV SOLN
10.0000 mg | Freq: Once | INTRAVENOUS | Status: AC
  Administered 2020-02-02: 10 mg via INTRAVENOUS
  Filled 2020-02-02: qty 10

## 2020-02-02 MED ORDER — POTASSIUM CHLORIDE 2 MEQ/ML IV SOLN
Freq: Once | INTRAVENOUS | Status: AC
  Filled 2020-02-02: qty 10

## 2020-02-02 MED ORDER — PALONOSETRON HCL INJECTION 0.25 MG/5ML
0.2500 mg | Freq: Once | INTRAVENOUS | Status: AC
  Administered 2020-02-02: 0.25 mg via INTRAVENOUS

## 2020-02-02 NOTE — Patient Instructions (Signed)
[facility] Discharge Instructions for Patients Receiving Chemotherapy  Today you received the following chemotherapy agents Cisplatin  To help prevent nausea and vomiting after your treatment, we encourage you to take your nausea medication as prescribed by MD. **DO NOT TAKE ZOFRAN FOR 3 DAYS AFTER CHEMOTHERAPY**   If you develop nausea and vomiting that is not controlled by your nausea medication, call the clinic.   BELOW ARE SYMPTOMS THAT SHOULD BE REPORTED IMMEDIATELY:  *FEVER GREATER THAN 100.5 F  *CHILLS WITH OR WITHOUT FEVER  NAUSEA AND VOMITING THAT IS NOT CONTROLLED WITH YOUR NAUSEA MEDICATION  *UNUSUAL SHORTNESS OF BREATH  *UNUSUAL BRUISING OR BLEEDING  TENDERNESS IN MOUTH AND THROAT WITH OR WITHOUT PRESENCE OF ULCERS  *URINARY PROBLEMS  *BOWEL PROBLEMS  UNUSUAL RASH Items with * indicate a potential emergency and should be followed up as soon as possible.  Feel free to call the clinic should you have any questions or concerns. The clinic phone number is [phone].  Please show the [facility] at check-in to the Emergency Department and triage nurse.

## 2020-02-02 NOTE — Progress Notes (Signed)
Pt urine output 180 ml at this time. Pt states unable to go again. Per Laverna Peace, NP; ok to start premeds and treatment with current urine output level

## 2020-02-07 ENCOUNTER — Other Ambulatory Visit: Payer: Self-pay | Admitting: *Deleted

## 2020-02-07 DIAGNOSIS — C689 Malignant neoplasm of urinary organ, unspecified: Secondary | ICD-10-CM

## 2020-02-07 DIAGNOSIS — C178 Malignant neoplasm of overlapping sites of small intestine: Secondary | ICD-10-CM

## 2020-02-08 ENCOUNTER — Other Ambulatory Visit: Payer: Self-pay

## 2020-02-08 ENCOUNTER — Inpatient Hospital Stay: Payer: Managed Care, Other (non HMO) | Attending: Hematology & Oncology

## 2020-02-08 ENCOUNTER — Inpatient Hospital Stay: Payer: Managed Care, Other (non HMO)

## 2020-02-08 DIAGNOSIS — C662 Malignant neoplasm of left ureter: Secondary | ICD-10-CM | POA: Insufficient documentation

## 2020-02-08 DIAGNOSIS — Z7901 Long term (current) use of anticoagulants: Secondary | ICD-10-CM | POA: Diagnosis not present

## 2020-02-08 DIAGNOSIS — Z923 Personal history of irradiation: Secondary | ICD-10-CM | POA: Diagnosis not present

## 2020-02-08 DIAGNOSIS — I82811 Embolism and thrombosis of superficial veins of right lower extremities: Secondary | ICD-10-CM | POA: Diagnosis not present

## 2020-02-08 DIAGNOSIS — C689 Malignant neoplasm of urinary organ, unspecified: Secondary | ICD-10-CM

## 2020-02-08 DIAGNOSIS — C178 Malignant neoplasm of overlapping sites of small intestine: Secondary | ICD-10-CM

## 2020-02-08 LAB — CBC WITH DIFFERENTIAL (CANCER CENTER ONLY)
Abs Immature Granulocytes: 0 10*3/uL (ref 0.00–0.07)
Basophils Absolute: 0 10*3/uL (ref 0.0–0.1)
Basophils Relative: 1 %
Eosinophils Absolute: 0 10*3/uL (ref 0.0–0.5)
Eosinophils Relative: 1 %
HCT: 35.7 % — ABNORMAL LOW (ref 39.0–52.0)
Hemoglobin: 11.6 g/dL — ABNORMAL LOW (ref 13.0–17.0)
Immature Granulocytes: 0 %
Lymphocytes Relative: 48 %
Lymphs Abs: 0.7 10*3/uL (ref 0.7–4.0)
MCH: 29.7 pg (ref 26.0–34.0)
MCHC: 32.5 g/dL (ref 30.0–36.0)
MCV: 91.5 fL (ref 80.0–100.0)
Monocytes Absolute: 0.1 10*3/uL (ref 0.1–1.0)
Monocytes Relative: 5 %
Neutro Abs: 0.7 10*3/uL — ABNORMAL LOW (ref 1.7–7.7)
Neutrophils Relative %: 45 %
Platelet Count: 109 10*3/uL — ABNORMAL LOW (ref 150–400)
RBC: 3.9 MIL/uL — ABNORMAL LOW (ref 4.22–5.81)
RDW: 13.4 % (ref 11.5–15.5)
WBC Count: 1.5 10*3/uL — ABNORMAL LOW (ref 4.0–10.5)
nRBC: 0 % (ref 0.0–0.2)

## 2020-02-08 LAB — CMP (CANCER CENTER ONLY)
ALT: 126 U/L — ABNORMAL HIGH (ref 0–44)
AST: 87 U/L — ABNORMAL HIGH (ref 15–41)
Albumin: 3.9 g/dL (ref 3.5–5.0)
Alkaline Phosphatase: 220 U/L — ABNORMAL HIGH (ref 38–126)
Anion gap: 5 (ref 5–15)
BUN: 20 mg/dL (ref 6–20)
CO2: 28 mmol/L (ref 22–32)
Calcium: 8.9 mg/dL (ref 8.9–10.3)
Chloride: 104 mmol/L (ref 98–111)
Creatinine: 1.55 mg/dL — ABNORMAL HIGH (ref 0.61–1.24)
GFR, Est AFR Am: 60 mL/min — ABNORMAL LOW (ref >60)
GFR, Estimated: 51 mL/min — ABNORMAL LOW (ref >60)
Glucose, Bld: 143 mg/dL — ABNORMAL HIGH (ref 70–99)
Potassium: 4.9 mmol/L (ref 3.5–5.1)
Sodium: 137 mmol/L (ref 135–145)
Total Bilirubin: 0.4 mg/dL (ref 0.3–1.2)
Total Protein: 6.4 g/dL — ABNORMAL LOW (ref 6.5–8.1)

## 2020-02-08 LAB — MAGNESIUM: Magnesium: 2 mg/dL (ref 1.7–2.4)

## 2020-02-08 NOTE — Progress Notes (Signed)
Labs reviewed by MD, no treatment today. Pt to call with elevated fever, or concerns. Neutropenic precautions discussed.

## 2020-02-12 ENCOUNTER — Other Ambulatory Visit: Payer: Self-pay

## 2020-02-12 ENCOUNTER — Ambulatory Visit (HOSPITAL_BASED_OUTPATIENT_CLINIC_OR_DEPARTMENT_OTHER)
Admission: RE | Admit: 2020-02-12 | Discharge: 2020-02-12 | Disposition: A | Discharge status: Home / Self Care | Payer: Managed Care, Other (non HMO) | Source: Ambulatory Visit | Attending: Family | Admitting: Family

## 2020-02-12 ENCOUNTER — Encounter: Payer: Self-pay | Admitting: Family

## 2020-02-12 ENCOUNTER — Telehealth: Payer: Self-pay | Admitting: *Deleted

## 2020-02-12 ENCOUNTER — Inpatient Hospital Stay (HOSPITAL_BASED_OUTPATIENT_CLINIC_OR_DEPARTMENT_OTHER): Payer: Managed Care, Other (non HMO) | Admitting: Family

## 2020-02-12 VITALS — BP 122/79 | HR 63 | Temp 97.5°F | Resp 16 | Wt 116.0 lb

## 2020-02-12 DIAGNOSIS — M7989 Other specified soft tissue disorders: Secondary | ICD-10-CM

## 2020-02-12 DIAGNOSIS — I82611 Acute embolism and thrombosis of superficial veins of right upper extremity: Secondary | ICD-10-CM

## 2020-02-12 DIAGNOSIS — C662 Malignant neoplasm of left ureter: Secondary | ICD-10-CM | POA: Diagnosis not present

## 2020-02-12 DIAGNOSIS — C689 Malignant neoplasm of urinary organ, unspecified: Secondary | ICD-10-CM | POA: Insufficient documentation

## 2020-02-12 MED ORDER — RIVAROXABAN 20 MG PO TABS: 20.0000 mg | ORAL_TABLET | Freq: Every day | ORAL | 0 refills | Status: DC

## 2020-02-12 NOTE — Progress Notes (Signed)
Hematology and Oncology Follow Up Visit  Keith [name] [id number] Oct 12, 1970 49 y.o. 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 Daily   Interim History:  Keith [name] is here today with c/o redness, swelling and pain at his IV site where her received treatment last week.   No other swelling noted. No numbness or tingling.  No bleeding, bruising or petechiae.  No fever, chills, n/v, cough, rash, dizziness, SOB, chest pain, palpitations, abdominal pain or changes in bowel or bladder habits. He is eating well and staying properly hydrated. His weight is stable.   ECOG Performance Status: 1 - Symptomatic but completely ambulatory  Medications:  Allergies as of 02/12/2020   No Known Allergies     Medication List       Accurate as of February 12, 2020  2:25 PM. If you have any questions, ask your nurse or doctor.        carboxymethylcellulose 0.5 % Soln Commonly known as: REFRESH PLUS Place 1 drop into both eyes 3 (three) times daily as needed (dry/irritated eyes.).   lipase/protease/amylase 36000 UNITS Cpep capsule Commonly known as: Creon Take 1 capsule (36,000 Units total) by mouth 3 (three) times daily with meals.       Allergies: No Known Allergies  Past Medical History, Surgical history, Social history, and Family History were reviewed and updated.  Review of Systems: All other 10 point review of systems is negative.   Physical Exam:  vitals were not taken for this visit.   Wt Readings from Last 3 Encounters:  02/01/20 115 lb (52.2 kg)  01/11/20 116 lb (52.6 kg)  12/20/19 115 lb 6.4 oz (52.3 kg)    Ocular: Sclerae unicteric, pupils equal, round and reactive to  light Ear-nose-throat: Oropharynx clear, dentition fair Lymphatic: No cervical or supraclavicular adenopathy Lungs no rales or rhonchi, good excursion bilaterally Heart regular rate and rhythm, no murmur appreciated Abd soft, nontender, positive bowel sounds, no liver or spleen tip palpated on exam, no fluid wave  MSK no focal spinal tenderness, no joint edema Neuro: non-focal, well-oriented, appropriate affect Breasts: Deferred   Lab Results  Component Value Date   WBC 1.5 (L) 02/08/2020   HGB 11.6 (L) 02/08/2020   HCT 35.7 (L) 02/08/2020   MCV 91.5 02/08/2020   PLT 109 (L) 02/08/2020   Lab Results  Component Value Date   FERRITIN 85 09/18/2019   IRON 95 09/18/2019   TIBC 331 09/18/2019   UIBC 237 09/18/2019   IRONPCTSAT 29 09/18/2019   Lab Results  Component Value Date   RBC 3.90 (L) 02/08/2020   No results found for: KPAFRELGTCHN, LAMBDASER, KAPLAMBRATIO No results found for: IGGSERUM, IGA, IGMSERUM No results found for: Odetta Pink, SPEI   Chemistry      Component Value Date/Time   NA 137 02/08/2020 1048   NA 147 (H) 08/06/2017 0816   NA 144 02/04/2017 0758   K 4.9 02/08/2020 1048   K 4.6 08/06/2017 0816   K 4.6 02/04/2017 0758   CL 104 02/08/2020 1048   CL 104 08/06/2017 0816   CO2 28 02/08/2020 1048   CO2 30 08/06/2017 0816   CO2 25 02/04/2017 0758   BUN 20 02/08/2020 1048  BUN 12 08/06/2017 0816   BUN 14.2 02/04/2017 0758   CREATININE 1.55 (H) 02/08/2020 1048   CREATININE 0.9 08/06/2017 0816   CREATININE 0.9 02/04/2017 0758      Component Value Date/Time   CALCIUM 8.9 02/08/2020 1048   CALCIUM 9.0 08/06/2017 0816   CALCIUM 8.9 02/04/2017 0758   ALKPHOS 220 (H) 02/08/2020 1048   ALKPHOS 108 (H) 08/06/2017 0816   ALKPHOS 113 02/04/2017 0758   AST 87 (H) 02/08/2020 1048   AST 24 02/04/2017 0758   ALT 126 (H) 02/08/2020 1048   ALT 27 08/06/2017 0816   ALT 20 02/04/2017 0758   BILITOT 0.4  02/08/2020 1048   BILITOT 0.91 02/04/2017 0758       Impression and Plan: Keith [name] 49 yo [place] gentleman with history of locally advanced small bowel cancer. He underwent resection and had 9 positive lymph nodes putting him at  risk for recurrence. He underwent aggressive adjuvant therapy with both chemotherapy and radiation therapy completed in October 2015. 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 He has high MSI and high TMB levels. CEA last month was down to 3.56. He has completed 2/4 cycles of cis-platinum/gemcitabine. He is here today with pain and swelling at his IV site from treatment a little over a week ago.  [place] today confirmed superficial thrombus of the right cephalic and basilic veins.  We will have him complete 6 weeks of Xarelto 20 mg PO daily.  We will see him at his next scheduled follow-up in 10 days.  He will contact our office with any questions or concerns. We can certainly see him sooner if needed.   Laverna Peace, NP 6/7/20212:25 PM

## 2020-02-12 NOTE — Telephone Encounter (Signed)
Patient called and stated,"my right arm is reddish in color, swollen and hard. This is where my IV site was for my chemo." I instructed him to come in today so, the NP could assess the IV site. LOS sent to scheduling. He verbalized understanding.

## 2020-02-15 NOTE — Progress Notes (Unsigned)
Pharmacist Chemotherapy Monitoring - Follow Up Assessment    I verify that I have reviewed each item in the below checklist:   Regimen for the patient is scheduled for the appropriate day and plan matches scheduled date.  Appropriate non-routine labs are ordered dependent on drug ordered.  If applicable, additional medications reviewed and ordered per protocol based on lifetime cumulative doses and/or treatment regimen.   Plan for follow-up and/or issues identified: No  I-vent associated with next due treatment: No  MD and/or nursing notified: No  Hailee Hollick, Jacqlyn Larsen 02/15/2020 2:57 PM

## 2020-02-22 ENCOUNTER — Encounter: Payer: Self-pay | Admitting: Hematology & Oncology

## 2020-02-22 ENCOUNTER — Encounter: Payer: Self-pay | Admitting: Family

## 2020-02-22 ENCOUNTER — Inpatient Hospital Stay: Payer: Managed Care, Other (non HMO)

## 2020-02-22 ENCOUNTER — Inpatient Hospital Stay (HOSPITAL_BASED_OUTPATIENT_CLINIC_OR_DEPARTMENT_OTHER): Payer: Managed Care, Other (non HMO) | Admitting: Family

## 2020-02-22 ENCOUNTER — Other Ambulatory Visit: Payer: Self-pay

## 2020-02-22 VITALS — BP 135/87 | HR 71 | Temp 98.4°F | Resp 18 | Ht 66.0 in | Wt 115.8 lb

## 2020-02-22 DIAGNOSIS — I82611 Acute embolism and thrombosis of superficial veins of right upper extremity: Secondary | ICD-10-CM | POA: Diagnosis not present

## 2020-02-22 DIAGNOSIS — C689 Malignant neoplasm of urinary organ, unspecified: Secondary | ICD-10-CM

## 2020-02-22 DIAGNOSIS — C662 Malignant neoplasm of left ureter: Secondary | ICD-10-CM | POA: Diagnosis not present

## 2020-02-22 DIAGNOSIS — C178 Malignant neoplasm of overlapping sites of small intestine: Secondary | ICD-10-CM

## 2020-02-22 LAB — CBC WITH DIFFERENTIAL (CANCER CENTER ONLY)
Abs Immature Granulocytes: 0.01 10*3/uL (ref 0.00–0.07)
Basophils Absolute: 0 10*3/uL (ref 0.0–0.1)
Basophils Relative: 1 %
Eosinophils Absolute: 0.1 10*3/uL (ref 0.0–0.5)
Eosinophils Relative: 4 %
HCT: 34.7 % — ABNORMAL LOW (ref 39.0–52.0)
Hemoglobin: 11.2 g/dL — ABNORMAL LOW (ref 13.0–17.0)
Immature Granulocytes: 1 %
Lymphocytes Relative: 45 %
Lymphs Abs: 0.9 10*3/uL (ref 0.7–4.0)
MCH: 29.8 pg (ref 26.0–34.0)
MCHC: 32.3 g/dL (ref 30.0–36.0)
MCV: 92.3 fL (ref 80.0–100.0)
Monocytes Absolute: 0.5 10*3/uL (ref 0.1–1.0)
Monocytes Relative: 24 %
Neutro Abs: 0.5 10*3/uL — ABNORMAL LOW (ref 1.7–7.7)
Neutrophils Relative %: 25 %
Platelet Count: 224 10*3/uL (ref 150–400)
RBC: 3.76 MIL/uL — ABNORMAL LOW (ref 4.22–5.81)
RDW: 14.8 % (ref 11.5–15.5)
WBC Count: 2.1 10*3/uL — ABNORMAL LOW (ref 4.0–10.5)
nRBC: 0 % (ref 0.0–0.2)

## 2020-02-22 LAB — CMP (CANCER CENTER ONLY)
ALT: 104 U/L — ABNORMAL HIGH (ref 0–44)
AST: 75 U/L — ABNORMAL HIGH (ref 15–41)
Albumin: 3.9 g/dL (ref 3.5–5.0)
Alkaline Phosphatase: 349 U/L — ABNORMAL HIGH (ref 38–126)
Anion gap: 6 (ref 5–15)
BUN: 17 mg/dL (ref 6–20)
CO2: 28 mmol/L (ref 22–32)
Calcium: 8.9 mg/dL (ref 8.9–10.3)
Chloride: 105 mmol/L (ref 98–111)
Creatinine: 1.55 mg/dL — ABNORMAL HIGH (ref 0.61–1.24)
GFR, Est AFR Am: 60 mL/min — ABNORMAL LOW (ref >60)
GFR, Estimated: 51 mL/min — ABNORMAL LOW (ref >60)
Glucose, Bld: 161 mg/dL — ABNORMAL HIGH (ref 70–99)
Potassium: 4.3 mmol/L (ref 3.5–5.1)
Sodium: 139 mmol/L (ref 135–145)
Total Bilirubin: 0.5 mg/dL (ref 0.3–1.2)
Total Protein: 6.4 g/dL — ABNORMAL LOW (ref 6.5–8.1)

## 2020-02-22 LAB — MAGNESIUM: Magnesium: 2.2 mg/dL (ref 1.7–2.4)

## 2020-02-22 LAB — LACTATE DEHYDROGENASE: LDH: 196 U/L — ABNORMAL HIGH (ref 98–192)

## 2020-02-22 NOTE — Progress Notes (Signed)
Hematology and Oncology Follow Up Visit  Keith [name] [id number] 1971/02/27 49 y.o. 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 He is doing well on Xarelto and has not had any issues with bleeding, bruising or petechiae. His right arm looks much better. Swelling has resolved.  WBC count is 2.1, Hgb 11.2 and platelets 224.  He states that he feels good. No fatigue at this time.  No fever, chills, n/v, cough, rash, dizziness, SOB, chest pain, palpitations, abdominal pain or changes in bowel or bladder habits.  No swelling, tenderness, numbness or tingling in his extremities at this time.  No falls or syncope.  He has maintained a good appetite and is staying well hydrated. His weight is stable.   ECOG Performance Status: 1 - Symptomatic but completely ambulatory  Medications:  Allergies as of 02/22/2020   No Known Allergies     Medication List       Accurate as of February 22, 2020  3:39 PM. If you have any questions, ask your nurse or doctor.        carboxymethylcellulose 0.5 % Soln Commonly known as: REFRESH PLUS Place 1 drop into both eyes 3 (three) times daily as needed (dry/irritated eyes.).   lipase/protease/amylase 36000 UNITS Cpep capsule Commonly known as: Creon Take 1 capsule (36,000 Units total) by mouth 3 (three) times daily with meals.   rivaroxaban 20 MG Tabs tablet Commonly known as: XARELTO Take 1 tablet (20 mg total) by mouth daily with supper.       Allergies: No Known Allergies  Past Medical History, Surgical history,  Social history, and Family History were reviewed and updated.  Review of Systems: All other 10 point review of systems is negative.   Physical Exam:  height is '5\' 6"'  (1.676 m) and weight is 115 lb 12.8 oz (52.5 kg). His oral temperature is 98.4 F (36.9 C). His blood pressure is 135/87 and his pulse is 71. His respiration is 18 and oxygen saturation is 100%.   Wt Readings from Last 3 Encounters:  02/22/20 115 lb 12.8 oz (52.5 kg)  02/12/20 116 lb (52.6 kg)  02/01/20 115 lb (52.2 kg)    Ocular: Sclerae unicteric, pupils equal, round and reactive to light Ear-nose-throat: Oropharynx clear, dentition fair Lymphatic: No cervical or supraclavicular adenopathy Lungs no rales or rhonchi, good excursion bilaterally Heart regular rate and rhythm, no murmur appreciated Abd soft, nontender, positive bowel sounds, no liver or spleen tip palpated on exam, no fluid wave  MSK no focal spinal tenderness, no joint edema Neuro: non-focal, well-oriented, appropriate affect Breasts: Deferred   Lab Results  Component Value Date   WBC 2.1 (L) 02/22/2020   HGB 11.2 (L) 02/22/2020   HCT 34.7 (L) 02/22/2020   MCV 92.3 02/22/2020   PLT 224 02/22/2020   Lab Results  Component Value Date   FERRITIN 85 09/18/2019   IRON 95 09/18/2019   TIBC 331 09/18/2019   UIBC 237 09/18/2019   IRONPCTSAT 29 09/18/2019   Lab Results  Component Value Date   RBC 3.76 (L) 02/22/2020   No results found for: KPAFRELGTCHN, LAMBDASER,  KAPLAMBRATIO No results found for: IGGSERUM, IGA, IGMSERUM No results found for: Kathrynn Ducking, MSPIKE, SPEI   Chemistry      Component Value Date/Time   NA 139 02/22/2020 1300   NA 147 (H) 08/06/2017 0816   NA 144 02/04/2017 0758   K 4.3 02/22/2020 1300   K 4.6 08/06/2017 0816   K 4.6 02/04/2017 0758   CL 105 02/22/2020 1300   CL 104 08/06/2017 0816   CO2 28 02/22/2020 1300   CO2 30 08/06/2017 0816   CO2 25 02/04/2017 0758   BUN  17 02/22/2020 1300   BUN 12 08/06/2017 0816   BUN 14.2 02/04/2017 0758   CREATININE 1.55 (H) 02/22/2020 1300   CREATININE 0.9 08/06/2017 0816   CREATININE 0.9 02/04/2017 0758      Component Value Date/Time   CALCIUM 8.9 02/22/2020 1300   CALCIUM 9.0 08/06/2017 0816   CALCIUM 8.9 02/04/2017 0758   ALKPHOS 349 (H) 02/22/2020 1300   ALKPHOS 108 (H) 08/06/2017 0816   ALKPHOS 113 02/04/2017 0758   AST 75 (H) 02/22/2020 1300   AST 24 02/04/2017 0758   ALT 104 (H) 02/22/2020 1300   ALT 27 08/06/2017 0816   ALT 20 02/04/2017 0758   BILITOT 0.5 02/22/2020 1300   BILITOT 0.91 02/04/2017 0758       Impression and Plan: [name] 49 yo [place] gentleman with history of locally advanced small bowel cancer. He underwent resection and had 9 positive lymph nodes putting him at  risk for recurrence. He underwent aggressive adjuvant therapy with both chemotherapy and radiation therapy completed in October 2015. 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 He has high MSI and high TMB levels. CEA in April was down to 3.56. Today's result is pending.  We will plan to complete 4 cycles of cis-platinum/gemcitabine. LFT's remain elevated.  Due to his WBC count we will hold treatment today and give him a week off per Dr. Marin Olp.  We will see him again next week and resume if his counts have improved.  He is in agreement with the plan and will contact our office with any questions or concerns. We can certainly see him sooner if needed.   Laverna Peace, NP 6/17/20213:39 PM

## 2020-02-23 ENCOUNTER — Inpatient Hospital Stay: Payer: Managed Care, Other (non HMO)

## 2020-02-23 ENCOUNTER — Telehealth: Payer: Self-pay | Admitting: Family

## 2020-02-23 LAB — CEA (IN HOUSE-CHCC): CEA (CHCC-In House): 4.84 ng/mL (ref 0.00–5.00)

## 2020-02-23 NOTE — Telephone Encounter (Signed)
Appointments scheduled patient is aware of times per 6/17 los

## 2020-02-29 ENCOUNTER — Telehealth: Payer: Self-pay | Admitting: *Deleted

## 2020-02-29 ENCOUNTER — Encounter (HOSPITAL_BASED_OUTPATIENT_CLINIC_OR_DEPARTMENT_OTHER): Payer: Self-pay

## 2020-02-29 ENCOUNTER — Other Ambulatory Visit: Payer: Self-pay

## 2020-02-29 ENCOUNTER — Inpatient Hospital Stay: Payer: Managed Care, Other (non HMO)

## 2020-02-29 ENCOUNTER — Inpatient Hospital Stay (HOSPITAL_BASED_OUTPATIENT_CLINIC_OR_DEPARTMENT_OTHER): Payer: Managed Care, Other (non HMO) | Admitting: Family

## 2020-02-29 ENCOUNTER — Ambulatory Visit (HOSPITAL_BASED_OUTPATIENT_CLINIC_OR_DEPARTMENT_OTHER)
Admission: RE | Admit: 2020-02-29 | Discharge: 2020-02-29 | Disposition: A | Discharge status: Home / Self Care | Payer: Managed Care, Other (non HMO) | Source: Ambulatory Visit | Attending: Family | Admitting: Family

## 2020-02-29 ENCOUNTER — Encounter: Payer: Self-pay | Admitting: Family

## 2020-02-29 VITALS — BP 111/81 | HR 57 | Temp 96.9°F | Resp 18 | Ht 66.0 in | Wt 116.0 lb

## 2020-02-29 DIAGNOSIS — I82611 Acute embolism and thrombosis of superficial veins of right upper extremity: Secondary | ICD-10-CM

## 2020-02-29 DIAGNOSIS — C689 Malignant neoplasm of urinary organ, unspecified: Secondary | ICD-10-CM

## 2020-02-29 DIAGNOSIS — R7989 Other specified abnormal findings of blood chemistry: Secondary | ICD-10-CM | POA: Diagnosis not present

## 2020-02-29 LAB — CBC WITH DIFFERENTIAL (CANCER CENTER ONLY)
Abs Immature Granulocytes: 0.02 10*3/uL (ref 0.00–0.07)
Basophils Absolute: 0 10*3/uL (ref 0.0–0.1)
Basophils Relative: 1 %
Eosinophils Absolute: 0.1 10*3/uL (ref 0.0–0.5)
Eosinophils Relative: 2 %
HCT: 34 % — ABNORMAL LOW (ref 39.0–52.0)
Hemoglobin: 11.1 g/dL — ABNORMAL LOW (ref 13.0–17.0)
Immature Granulocytes: 1 %
Lymphocytes Relative: 28 %
Lymphs Abs: 1 10*3/uL (ref 0.7–4.0)
MCH: 30.1 pg (ref 26.0–34.0)
MCHC: 32.6 g/dL (ref 30.0–36.0)
MCV: 92.1 fL (ref 80.0–100.0)
Monocytes Absolute: 0.6 10*3/uL (ref 0.1–1.0)
Monocytes Relative: 17 %
Neutro Abs: 1.8 10*3/uL (ref 1.7–7.7)
Neutrophils Relative %: 51 %
Platelet Count: 159 10*3/uL (ref 150–400)
RBC: 3.69 MIL/uL — ABNORMAL LOW (ref 4.22–5.81)
RDW: 15.1 % (ref 11.5–15.5)
WBC Count: 3.5 10*3/uL — ABNORMAL LOW (ref 4.0–10.5)
nRBC: 0 % (ref 0.0–0.2)

## 2020-02-29 LAB — CMP (CANCER CENTER ONLY)
ALT: 470 U/L (ref 0–44)
AST: 433 U/L (ref 15–41)
Albumin: 3.7 g/dL (ref 3.5–5.0)
Alkaline Phosphatase: 369 U/L — ABNORMAL HIGH (ref 38–126)
Anion gap: 6 (ref 5–15)
BUN: 14 mg/dL (ref 6–20)
CO2: 28 mmol/L (ref 22–32)
Calcium: 9 mg/dL (ref 8.9–10.3)
Chloride: 109 mmol/L (ref 98–111)
Creatinine: 1.48 mg/dL — ABNORMAL HIGH (ref 0.61–1.24)
GFR, Est AFR Am: >60 mL/min (ref >60)
GFR, Estimated: 54 mL/min — ABNORMAL LOW (ref >60)
Glucose, Bld: 132 mg/dL — ABNORMAL HIGH (ref 70–99)
Potassium: 4.5 mmol/L (ref 3.5–5.1)
Sodium: 143 mmol/L (ref 135–145)
Total Bilirubin: 0.7 mg/dL (ref 0.3–1.2)
Total Protein: 6.4 g/dL — ABNORMAL LOW (ref 6.5–8.1)

## 2020-02-29 MED ORDER — IOHEXOL 300 MG/ML  SOLN
100.0000 mL | Freq: Once | INTRAMUSCULAR | Status: AC | PRN
  Administered 2020-02-29: 100 mL via INTRAVENOUS

## 2020-02-29 NOTE — Progress Notes (Signed)
Hematology and Oncology Follow Up Visit  Keith [name] [id number] 03/04/1971 49 y.o. 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 His WBC count and platelets have been low at times causing [place] to hold his treatment.  Unfortunately, today his LFT's have increased significantly. His last treatment was on 02/02/2020. He is asymptomatic at this time. No abdominal pain or bloating. No organomegaly noted on exam.  No fever, chills, n/v, cough, rash, dizziness, SOB, chest discomfort, palpitations or changes in bowel or bladder habits.  No swelling, tenderness, numbness or tingling in his extremities.  No falls or syncope.  No episodes of bleeding. No bruising or petechiae.  He is still eating well and staying hydrated. His weight is stable at 116 lbs.   ECOG Performance Status: 1 - Symptomatic but completely ambulatory  Medications:  Allergies as of 02/29/2020   No Known Allergies     Medication List       Accurate as of February 29, 2020 12:09 PM. If you have any questions, ask your nurse or doctor.        carboxymethylcellulose 0.5 % Soln Commonly known as: REFRESH PLUS Place 1 drop into both eyes 3 (three) times daily as needed (dry/irritated eyes.).   lipase/protease/amylase 36000 UNITS Cpep capsule Commonly known as: Creon Take 1 capsule (36,000 Units total) by mouth 3 (three) times daily with meals.   rivaroxaban 20 MG Tabs tablet Commonly known as: XARELTO Take 1 tablet (20 mg total) by mouth daily with supper.        Allergies: No Known Allergies  Past Medical History, Surgical history, Social history, and Family History were reviewed and updated.  Review of Systems: All other 10 point review of systems is negative.   Physical Exam:  height is '5\' 6"'  (1.676 m) and weight is 116 lb (52.6 kg). His temporal temperature is 96.9 F (36.1 C) (abnormal). His blood pressure is 111/81 and his pulse is 57 (abnormal). His respiration is 18 and oxygen saturation is 100%.   Wt Readings from Last 3 Encounters:  02/29/20 116 lb (52.6 kg)  02/22/20 115 lb 12.8 oz (52.5 kg)  02/12/20 116 lb (52.6 kg)    Ocular: Sclerae unicteric, pupils equal, round and reactive to light Ear-nose-throat: Oropharynx clear, dentition fair Lymphatic: No cervical or supraclavicular adenopathy Lungs no rales or rhonchi, good excursion bilaterally Heart regular rate and rhythm, no murmur appreciated Abd soft, nontender, positive bowel sounds, no liver or spleen tip palpated on exam, no fluid wave  MSK no focal spinal tenderness, no joint edema Neuro: non-focal, well-oriented, appropriate affect Breasts: Deferred   Lab Results  Component Value Date   WBC 3.5 (L) 02/29/2020   HGB 11.1 (L) 02/29/2020   HCT 34.0 (L) 02/29/2020   MCV 92.1 02/29/2020   PLT 159 02/29/2020   Lab Results  Component Value Date   FERRITIN 85 09/18/2019   IRON 95 09/18/2019   TIBC 331 09/18/2019   UIBC 237 09/18/2019   IRONPCTSAT 29 09/18/2019   Lab Results  Component Value Date   RBC 3.69 (L) 02/29/2020  No results found for: KPAFRELGTCHN, LAMBDASER, KAPLAMBRATIO No results found for: IGGSERUM, IGA, IGMSERUM No results found for: Kathrynn Ducking, MSPIKE, SPEI   Chemistry      Component Value Date/Time   NA 139 02/22/2020 1300   NA 147 (H) 08/06/2017 0816   NA 144 02/04/2017 0758   K 4.3 02/22/2020 1300   K 4.6 08/06/2017 0816   K 4.6 02/04/2017 0758   CL 105 02/22/2020 1300   CL 104  08/06/2017 0816   CO2 28 02/22/2020 1300   CO2 30 08/06/2017 0816   CO2 25 02/04/2017 0758   BUN 17 02/22/2020 1300   BUN 12 08/06/2017 0816   BUN 14.2 02/04/2017 0758   CREATININE 1.55 (H) 02/22/2020 1300   CREATININE 0.9 08/06/2017 0816   CREATININE 0.9 02/04/2017 0758      Component Value Date/Time   CALCIUM 8.9 02/22/2020 1300   CALCIUM 9.0 08/06/2017 0816   CALCIUM 8.9 02/04/2017 0758   ALKPHOS 349 (H) 02/22/2020 1300   ALKPHOS 108 (H) 08/06/2017 0816   ALKPHOS 113 02/04/2017 0758   AST 75 (H) 02/22/2020 1300   AST 24 02/04/2017 0758   ALT 104 (H) 02/22/2020 1300   ALT 27 08/06/2017 0816   ALT 20 02/04/2017 0758   BILITOT 0.5 02/22/2020 1300   BILITOT 0.91 02/04/2017 0758       Impression and Plan: Keith [name] 1yo[place] gentleman with history oflocally advanced small bowel cancer. He underwent resectionand had9 positive lymph nodes putting him atrisk for recurrence. He underwent aggressive adjuvant therapy with both chemotherapy and radiation therapy completed inOctober 2015. 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 He hashigh MSI and high TMB levels. CEA last week was stable at 4.84. Unfortunately, his LFT's have significantly increased in the last week despite his not having been treated for almost a month.  We will get a STAT CT of the abdomen and pelvis to assess for liver changes/possible progression of disease. We will continue to follow closely along with him and see him again in another week. CT scans will determine how we proceed with treatment.  He is in agreement with the plan and will contact our office with any questions or concerns. We can certainly see him sooner if needed.  Laverna Peace, NP 6/24/202112:09 PM

## 2020-02-29 NOTE — Telephone Encounter (Signed)
Dr. Marin Olp notified of ast-433, alt-470 and alk phos-369.  Order received to hold pt.'s treatment today and for pt to have a stat CT of abd/pelvis with contrast.

## 2020-03-01 ENCOUNTER — Inpatient Hospital Stay: Payer: Managed Care, Other (non HMO)

## 2020-03-04 ENCOUNTER — Other Ambulatory Visit: Payer: Self-pay | Admitting: Family

## 2020-03-04 ENCOUNTER — Encounter: Payer: Self-pay | Admitting: Gastroenterology

## 2020-03-04 ENCOUNTER — Telehealth: Payer: Self-pay | Admitting: Family

## 2020-03-04 DIAGNOSIS — R7989 Other specified abnormal findings of blood chemistry: Secondary | ICD-10-CM

## 2020-03-04 NOTE — Telephone Encounter (Signed)
I was able to speak with Keith [name] regarding his CT scan from last week. We will be getting him set up with GI for evaluation for MRCP. He verbalized understanding and agreement with the plan. We will plan to see him again in another 3 weeks.

## 2020-03-07 ENCOUNTER — Ambulatory Visit: Payer: Managed Care, Other (non HMO) | Admitting: Hematology & Oncology

## 2020-03-07 ENCOUNTER — Ambulatory Visit: Payer: Managed Care, Other (non HMO)

## 2020-03-07 ENCOUNTER — Other Ambulatory Visit: Payer: Managed Care, Other (non HMO)

## 2020-03-08 ENCOUNTER — Ambulatory Visit: Payer: Managed Care, Other (non HMO)

## 2020-03-22 ENCOUNTER — Other Ambulatory Visit: Payer: Self-pay | Admitting: *Deleted

## 2020-03-22 DIAGNOSIS — C689 Malignant neoplasm of urinary organ, unspecified: Secondary | ICD-10-CM

## 2020-03-22 DIAGNOSIS — R7989 Other specified abnormal findings of blood chemistry: Secondary | ICD-10-CM

## 2020-03-22 DIAGNOSIS — C178 Malignant neoplasm of overlapping sites of small intestine: Secondary | ICD-10-CM

## 2020-03-25 ENCOUNTER — Other Ambulatory Visit: Payer: Self-pay

## 2020-03-25 ENCOUNTER — Inpatient Hospital Stay: Payer: Managed Care, Other (non HMO) | Attending: Hematology & Oncology

## 2020-03-25 ENCOUNTER — Encounter: Payer: Self-pay | Admitting: Hematology & Oncology

## 2020-03-25 ENCOUNTER — Inpatient Hospital Stay (HOSPITAL_BASED_OUTPATIENT_CLINIC_OR_DEPARTMENT_OTHER): Payer: Managed Care, Other (non HMO) | Admitting: Hematology & Oncology

## 2020-03-25 VITALS — BP 124/82 | HR 60 | Temp 98.2°F | Resp 18 | Wt 112.0 lb

## 2020-03-25 DIAGNOSIS — C662 Malignant neoplasm of left ureter: Secondary | ICD-10-CM | POA: Insufficient documentation

## 2020-03-25 DIAGNOSIS — I82611 Acute embolism and thrombosis of superficial veins of right upper extremity: Secondary | ICD-10-CM

## 2020-03-25 DIAGNOSIS — Z7901 Long term (current) use of anticoagulants: Secondary | ICD-10-CM | POA: Diagnosis not present

## 2020-03-25 DIAGNOSIS — Z9221 Personal history of antineoplastic chemotherapy: Secondary | ICD-10-CM | POA: Insufficient documentation

## 2020-03-25 DIAGNOSIS — C178 Malignant neoplasm of overlapping sites of small intestine: Secondary | ICD-10-CM | POA: Diagnosis not present

## 2020-03-25 DIAGNOSIS — Z85068 Personal history of other malignant neoplasm of small intestine: Secondary | ICD-10-CM | POA: Diagnosis not present

## 2020-03-25 DIAGNOSIS — I82B11 Acute embolism and thrombosis of right subclavian vein: Secondary | ICD-10-CM | POA: Diagnosis not present

## 2020-03-25 DIAGNOSIS — C689 Malignant neoplasm of urinary organ, unspecified: Secondary | ICD-10-CM | POA: Diagnosis not present

## 2020-03-25 DIAGNOSIS — R7989 Other specified abnormal findings of blood chemistry: Secondary | ICD-10-CM

## 2020-03-25 HISTORY — DX: Acute embolism and thrombosis of superficial veins of right upper extremity: I82.611

## 2020-03-25 LAB — CBC WITH DIFFERENTIAL (CANCER CENTER ONLY)
Abs Immature Granulocytes: 0.01 10*3/uL (ref 0.00–0.07)
Basophils Absolute: 0 10*3/uL (ref 0.0–0.1)
Basophils Relative: 1 %
Eosinophils Absolute: 0.3 10*3/uL (ref 0.0–0.5)
Eosinophils Relative: 8 %
HCT: 36.6 % — ABNORMAL LOW (ref 39.0–52.0)
Hemoglobin: 12 g/dL — ABNORMAL LOW (ref 13.0–17.0)
Immature Granulocytes: 0 %
Lymphocytes Relative: 27 %
Lymphs Abs: 1 10*3/uL (ref 0.7–4.0)
MCH: 30.2 pg (ref 26.0–34.0)
MCHC: 32.8 g/dL (ref 30.0–36.0)
MCV: 92 fL (ref 80.0–100.0)
Monocytes Absolute: 0.3 10*3/uL (ref 0.1–1.0)
Monocytes Relative: 8 %
Neutro Abs: 2.1 10*3/uL (ref 1.7–7.7)
Neutrophils Relative %: 56 %
Platelet Count: 141 10*3/uL — ABNORMAL LOW (ref 150–400)
RBC: 3.98 MIL/uL — ABNORMAL LOW (ref 4.22–5.81)
RDW: 14.7 % (ref 11.5–15.5)
WBC Count: 3.8 10*3/uL — ABNORMAL LOW (ref 4.0–10.5)
nRBC: 0 % (ref 0.0–0.2)

## 2020-03-25 LAB — CMP (CANCER CENTER ONLY)
ALT: 22 U/L (ref 0–44)
AST: 21 U/L (ref 15–41)
Albumin: 4 g/dL (ref 3.5–5.0)
Alkaline Phosphatase: 212 U/L — ABNORMAL HIGH (ref 38–126)
Anion gap: 6 (ref 5–15)
BUN: 14 mg/dL (ref 6–20)
CO2: 27 mmol/L (ref 22–32)
Calcium: 9.2 mg/dL (ref 8.9–10.3)
Chloride: 108 mmol/L (ref 98–111)
Creatinine: 1.37 mg/dL — ABNORMAL HIGH (ref 0.61–1.24)
GFR, Est AFR Am: >60 mL/min (ref >60)
GFR, Estimated: 60 mL/min — ABNORMAL LOW (ref >60)
Glucose, Bld: 144 mg/dL — ABNORMAL HIGH (ref 70–99)
Potassium: 4 mmol/L (ref 3.5–5.1)
Sodium: 141 mmol/L (ref 135–145)
Total Bilirubin: 0.7 mg/dL (ref 0.3–1.2)
Total Protein: 6.8 g/dL (ref 6.5–8.1)

## 2020-03-25 NOTE — Progress Notes (Signed)
Hematology and Oncology Follow Up Visit  Cayden Granholm [id number] 06/06/1971 49 y.o. 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 Vecchio is here today for follow-up.  Unfortunately, I just do not think will be able to treat him with adjuvant chemotherapy.  I do still think his liver can handle treatment.  Thankfully, his liver function studies are getting back to normal right now.  He is doing okay.  He is lost quite a bit of weight.  Hopefully the weight will come back on him.  I think the real important aspect of his tumor is that his tumor is MSI high.  This would be indicative of him possibly having the Lynch syndrome.  We also may need to test him for this.  He has children.  For right now, we will just have him stay off treatment.  I would repeat a CT scan probably in about 2 or 3 months and follow him just with surveillance.  He is not having any abdominal pain.  He is having no hematuria.  He is having no problems with cough or shortness of breath.  He did start running again.  He has had no fever.  There has been no leg swelling.  He is on Xarelto.  We may have to think about repeating a Doppler when we see him back.  Overall, I would say his performance status is probably ECOG 1.  Medications:  Allergies as of 03/25/2020   No Known Allergies     Medication List       Accurate as of March 25, 2020  3:13 PM. If you have any questions, ask your nurse or doctor.        carboxymethylcellulose 0.5 % Soln Commonly known as: REFRESH  PLUS Place 1 drop into both eyes 3 (three) times daily as needed (dry/irritated eyes.).   lipase/protease/amylase 36000 UNITS Cpep capsule Commonly known as: Creon Take 1 capsule (36,000 Units total) by mouth 3 (three) times daily with meals.   rivaroxaban 20 MG Tabs tablet Commonly known as: XARELTO Take 1 tablet (20 mg total) by mouth daily with supper.       Allergies: No Known Allergies  Past Medical History, Surgical history, Social history, and Family History were reviewed and updated.  Review of Systems: Review of Systems  Constitutional: Positive for weight loss.  HENT: Negative.   Eyes: Negative.   Respiratory: Negative.   Cardiovascular: Negative.   Gastrointestinal: Negative.   Genitourinary: Negative.   Musculoskeletal: Negative.   Skin: Negative.   Neurological: Negative.   Endo/Heme/Allergies: Negative.   Psychiatric/Behavioral: Negative.      Physical Exam:  weight is 112 lb (50.8 kg). His temporal temperature is 98.2 F (36.8 C). His blood pressure is 124/82 and his pulse is 60. His respiration is 18 and oxygen saturation is 100%.   Wt Readings from Last 3 Encounters:  03/25/20 112 lb (50.8 kg)  02/29/20 116 lb (52.6 kg)  02/22/20 115 lb 12.8 oz (52.5 kg)    Physical Exam Vitals reviewed.  HENT:     Head: Normocephalic and atraumatic.  Eyes:  Pupils: Pupils are equal, round, and reactive to light.  Cardiovascular:     Rate and Rhythm: Normal rate and regular rhythm.     Heart sounds: Normal heart sounds.  Pulmonary:     Effort: Pulmonary effort is normal.     Breath sounds: Normal breath sounds.  Abdominal:     General: Bowel sounds are normal.     Palpations: Abdomen is soft.     Comments: Abdominal exam shows a soft abdomen.  He has well-healed laparoscopic scars from his ureteral surgery.  He has a well-healed laparotomy scar from the small bowel cancer.  He has good bowel sounds.  There is no fluid wave.  There is no palpable liver or  spleen tip.  Musculoskeletal:        General: No tenderness or deformity. Normal range of motion.     Cervical back: Normal range of motion.  Lymphadenopathy:     Cervical: No cervical adenopathy.  Skin:    General: Skin is warm and dry.     Findings: No erythema or rash.  Neurological:     Mental Status: He is alert and oriented to person, place, and time.  Psychiatric:        Behavior: Behavior normal.        Thought Content: Thought content normal.        Judgment: Judgment normal.      Lab Results  Component Value Date   WBC 3.8 (L) 03/25/2020   HGB 12.0 (L) 03/25/2020   HCT 36.6 (L) 03/25/2020   MCV 92.0 03/25/2020   PLT 141 (L) 03/25/2020   Lab Results  Component Value Date   FERRITIN 85 09/18/2019   IRON 95 09/18/2019   TIBC 331 09/18/2019   UIBC 237 09/18/2019   IRONPCTSAT 29 09/18/2019   Lab Results  Component Value Date   RBC 3.98 (L) 03/25/2020   No results found for: KPAFRELGTCHN, LAMBDASER, KAPLAMBRATIO No results found for: IGGSERUM, IGA, IGMSERUM No results found for: Kathrynn Ducking, MSPIKE, SPEI   Chemistry      Component Value Date/Time   NA 141 03/25/2020 1345   NA 147 (H) 08/06/2017 0816   NA 144 02/04/2017 0758   K 4.0 03/25/2020 1345   K 4.6 08/06/2017 0816   K 4.6 02/04/2017 0758   CL 108 03/25/2020 1345   CL 104 08/06/2017 0816   CO2 27 03/25/2020 1345   CO2 30 08/06/2017 0816   CO2 25 02/04/2017 0758   BUN 14 03/25/2020 1345   BUN 12 08/06/2017 0816   BUN 14.2 02/04/2017 0758   CREATININE 1.37 (H) 03/25/2020 1345   CREATININE 0.9 08/06/2017 0816   CREATININE 0.9 02/04/2017 0758      Component Value Date/Time   CALCIUM 9.2 03/25/2020 1345   CALCIUM 9.0 08/06/2017 0816   CALCIUM 8.9 02/04/2017 0758   ALKPHOS 212 (H) 03/25/2020 1345   ALKPHOS 108 (H) 08/06/2017 0816   ALKPHOS 113 02/04/2017 0758   AST 21 03/25/2020 1345   AST 24 02/04/2017 0758   ALT 22 03/25/2020 1345   ALT 27  08/06/2017 0816   ALT 20 02/04/2017 0758   BILITOT 0.7 03/25/2020 1345   BILITOT 0.91 02/04/2017 0758       Impression and Plan: Mr.Gengis 31yo[place] gentleman with history oflocally advanced small bowel cancer. He underwent resectionand had9 positive lymph nodes putting him atrisk for recurrence. He underwent aggressive adjuvant therapy with both chemotherapy and radiation therapy completed inOctober  2015.  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 He hashigh MSI and high TMB levels.  We tried him on adjuvant chemotherapy with cis-platinum/gemcitabine.  After 2 cycles, we stopped secondary to hepatotoxicity.  At this point, we will just follow him along with scans.  I know that there is a risk of recurrence.  I think that the risk of recurrence should be less than 20% at this time.  We will have to follow-up his Doppler of the right arm.  He is on Xarelto.  We may want to think about decreasing the dose of Xarelto if we see that he is responding with decrease in the thrombotic episode.  I am just glad that he is starting to get back to his quality of life.  His weight will come up eventually.  We will plan to see him back in another 3 months.  We will get a CT scan the same day that we see him.   Volanda Napoleon, MD 7/19/20213:13 PM

## 2020-03-26 LAB — LACTATE DEHYDROGENASE: LDH: 168 U/L (ref 98–192)

## 2020-03-26 LAB — CEA (IN HOUSE-CHCC): CEA (CHCC-In House): 4.15 ng/mL (ref 0.00–5.00)

## 2020-05-03 ENCOUNTER — Ambulatory Visit: Payer: Managed Care, Other (non HMO) | Admitting: Gastroenterology

## 2020-06-12 ENCOUNTER — Ambulatory Visit (INDEPENDENT_AMBULATORY_CARE_PROVIDER_SITE_OTHER): Payer: Managed Care, Other (non HMO) | Admitting: Gastroenterology

## 2020-06-12 ENCOUNTER — Encounter: Payer: Self-pay | Admitting: Gastroenterology

## 2020-06-12 VITALS — BP 112/76 | HR 71 | Ht 66.0 in | Wt 116.0 lb

## 2020-06-12 DIAGNOSIS — R109 Unspecified abdominal pain: Secondary | ICD-10-CM

## 2020-06-12 DIAGNOSIS — Z85068 Personal history of other malignant neoplasm of small intestine: Secondary | ICD-10-CM | POA: Diagnosis not present

## 2020-06-12 DIAGNOSIS — Z1211 Encounter for screening for malignant neoplasm of colon: Secondary | ICD-10-CM

## 2020-06-12 DIAGNOSIS — R748 Abnormal levels of other serum enzymes: Secondary | ICD-10-CM | POA: Diagnosis not present

## 2020-06-12 DIAGNOSIS — Z90410 Acquired total absence of pancreas: Secondary | ICD-10-CM | POA: Diagnosis not present

## 2020-06-12 DIAGNOSIS — Z9049 Acquired absence of other specified parts of digestive tract: Secondary | ICD-10-CM

## 2020-06-12 DIAGNOSIS — C662 Malignant neoplasm of left ureter: Secondary | ICD-10-CM

## 2020-06-12 MED ORDER — CLENPIQ 10-3.5-12 MG-GM -GM/160ML PO SOLN
1.0000 | ORAL | 0 refills | Status: DC
Start: 2020-06-12 — End: 2020-07-23

## 2020-06-12 NOTE — Patient Instructions (Addendum)
If you are age 51 or older, your body mass index should be between 23-30. Your Body mass index is 18.72 kg/m. If this is out of the aforementioned range listed, please consider follow up with your Primary Care Provider.  If you are age 72 or younger, your body mass index should be between 19-25. Your Body mass index is 18.72 kg/m. If this is out of the aformentioned range listed, please consider follow up with your Primary Care Provider.   You have been scheduled for an endoscopy and colonoscopy. Please follow the written instructions given to you at your visit today. Please pick up your prep supplies at the pharmacy within the next 1-3 days. If you use inhalers (even only as needed), please bring them with you on the day of your procedure.  We have sent the following medications to your pharmacy for you to pick up at your convenience: [facility] provider has requested that you go to the basement level at [facility], [place], [place] for lab work. Press "B" on the elevator. The lab is located at the first door on the left as you exit the elevator.  You are being referred to the [facility] for Genetic referral.  They will contact you with an appointment.  It was a pleasure to see you today!  Vito Cirigliano, D.O.

## 2020-06-12 NOTE — Progress Notes (Signed)
Chief Complaint: Abdominal pain  Referring Provider:     [place], Holli Humbles, NP   HPI:    Keith [name] is a 49 y.o. male with a history of stage IIIc adenocarcinoma of the small bowel (treated 2015 with chemotherapy and XRT and Whipple surgical resection with 9+ lymph nodes; completed chemotherapy/radiation 06/2014), stage II carcinoma of the left ureter (diagnosed 2021; treated with surgery and 2 cycles of chemotherapy but stopped due to hepatotoxicity), superficial thrombus (on Xarelto) referred to the [facility] for evaluation of intermittent abdominal pain.  Of note, his left ureteral papillary carcinoma tumor is MSI high suggestive of possible Lynch Syndrome.    Currently not on any therapy with plan for surveillance CT scan later this month.   More recently developed intermittent abdominal pain over last 6 months or so. Described as L>R abdomen, mild pain. Last occurrence was 3 weeks ago.  No associated nausea or vomiting, overt GI bleeding.  Colonoscopy approximately 6 years ago.   As above, hepatotoxicity with recent chemotherapy, with peak AST/ALT 433/470, ALP 369, with normal T bili.  Liver enzymes rapidly improved after cessation of therapy, with repeat labs in 03/2020 with AST/ALT 21/22, ALP 212.  Liver enzymes were completely normal prior to therapy in 01/2020.  CT abdomen/pelvis (02/29/2020): Mild intrahepatic biliary ductal dilation increased from prior, s/p Whipple with mildly atrophic appearance of the pancreatic parenchyma and prominence of the PD to 4 mm, unchanged from previous.  FHx: - Father: Thyroid CA - Mother: Ureteral CA - Sister: Ureteral CA - Sister: ?Stomach CA (died 30+ years ago) - Cousin: Colon CA   Past Medical History:  Diagnosis Date  . Acute thrombosis of cephalic vein, right 7/56/4332  . Cancer of small intestine, including duodenum ([place]) 10/26/2013   small intestine adenocarcinoma stage IIIc   . CHF (congestive heart  failure) ([place])    patient denies  . Chronic hepatitis B ([place])   . Diabetes mellitus without complication ([place])    patient denies  . GERD (gastroesophageal reflux disease)    remote history  . Hydronephrosis of left kidney   . Hypertension    no medications at this time  . Impaired fasting glucose   . Lung nodules    2 mm nodule in the posterior left lower lobe (4/115), stable. 4 mm ground-glass nodule in the apical segment right upper lobe (4/30), unchanged as well.  . Platelets decreased ([place])   . Radiation 01/31/14-03/06/14   upper abdomen/surgical bed  . Urothelial cancer ([place]) 12/20/2019     Past Surgical History:  Procedure Laterality Date  . ABDOMINAL EXPLORATION SURGERY  09/19/2013   done in [place]  . APPENDECTOMY  1997  . BACK SURGERY     tumor excision on back  . COLONOSCOPY    . CYSTOSCOPY WITH RETROGRADE PYELOGRAM, URETEROSCOPY AND STENT PLACEMENT Left 10/26/2019   Procedure: CYSTOSCOPY WITH LEFT RETROGRADE PYELOGRAM, URETEROSCOPY;  Surgeon: Franchot Gallo, MD;  Location: [facility];  Service: Urology;  Laterality: Left;  1 HR  . PANCREATICODUODENECTOMY  09/19/2013  . PARTIAL GASTRECTOMY  09/19/2013  . UPPER GI ENDOSCOPY     Family History  Problem Relation Age of Onset  . Cancer Mother        Ureter Cancer  . Hypertension Other   . Colon cancer Sister 65  . Cancer Sister 20       colon  . Esophageal cancer Father 31  . Cancer  Father        thyroid   Social History   Tobacco Use  . Smoking status: Former Smoker    Packs/day: 0.25    Years: 25.00    Pack years: 6.25    Types: Cigarettes    Start date: 09/07/1986    Quit date: 09/13/2013    Years since quitting: 6.7  . Smokeless tobacco: Never Used  . Tobacco comment: quit January of 2015  Vaping Use  . Vaping Use: Never used  Substance Use Topics  . Alcohol use: No    Alcohol/week: 0.0 standard drinks    Comment: He drinks rarely   . Drug use: No   Current Outpatient Medications  Medication Sig  Dispense Refill  . carboxymethylcellulose (REFRESH PLUS) 0.5 % SOLN Place 1 drop into both eyes 3 (three) times daily as needed (dry/irritated eyes.).     [name] lipase/protease/amylase (CREON) 36000 UNITS CPEP capsule Take 1 capsule (36,000 Units total) by mouth 3 (three) times daily with meals. 90 capsule 0  . rivaroxaban (XARELTO) 20 MG TABS tablet Take 1 tablet (20 mg total) by mouth daily with supper. 45 tablet 0   No current facility-administered medications for this visit.   No Known Allergies   Review of Systems: All systems reviewed and negative except where noted in HPI.     Physical Exam:    Wt Readings from Last 3 Encounters:  03/25/20 112 lb (50.8 kg)  02/29/20 116 lb (52.6 kg)  02/22/20 115 lb 12.8 oz (52.5 kg)    There were no vitals taken for this visit. Constitutional:  Pleasant, in no acute distress. Psychiatric: Normal mood and affect. Behavior is normal. EENT: Pupils normal.  Conjunctivae are normal. No scleral icterus. Neck supple. No cervical LAD. Cardiovascular: Normal rate, regular rhythm. No edema Pulmonary/chest: Effort normal and breath sounds normal. No wheezing, rales or rhonchi. Abdominal: Soft, nondistended, nontender. Bowel sounds active throughout. There are no masses palpable. No hepatomegaly. Neurological: Alert and oriented to person place and time. Skin: Skin is warm and dry. No rashes noted.   ASSESSMENT AND PLAN;   1) Abdominal pain 2) History of duodenal cancer 3) History of Whipple 4) Left ureteral papillary carcinoma with MSI high  -Had a long discussion today regarding strong family history of malignancy, personal history of 2 primary cancers, including small bowel adenocarcinoma and recently diagnosed ureteral cancer with MSI.  Discussed [place] criteria and fits family criteria for Lynch syndrome. -Due to family history and GI symptoms, plan for upper endoscopy and colonoscopy -Referral to [facility] for more formal testing  which could in turn affect early screening recommendations for his children -Scheduled for repeat CT later this month  5) Elevated alkaline phosphatase -Repeat liver enzymes.  If ALP still elevated, send for fractionization -As above, scheduled for repeat CT later this month.  If ongoing duct dilation, particularly if ALP still elevated, low threshold for MRI/MRCP and/or EUS  6) Colon cancer screening -Schedule colonoscopy  The indications, risks, and benefits of EGD and colonoscopy were explained to the patient in detail. Risks include but are not limited to bleeding, perforation, adverse reaction to medications, and cardiopulmonary compromise. Sequelae include but are not limited to the possibility of surgery, hositalization, and mortality. The patient verbalized understanding and wished to proceed. All questions answered, referred to scheduler and bowel prep ordered. Further recommendations pending results of the exam.     Dominic Pea Riann Oman, DO, FACG  06/12/2020, 1:20 PM   [place], Holli Humbles,  NP

## 2020-06-19 ENCOUNTER — Telehealth: Payer: Self-pay | Admitting: Genetic Counselor

## 2020-06-19 ENCOUNTER — Telehealth: Payer: Self-pay

## 2020-06-19 DIAGNOSIS — Z809 Family history of malignant neoplasm, unspecified: Secondary | ICD-10-CM

## 2020-06-19 NOTE — Telephone Encounter (Signed)
Received a genetic counseling referral from [facility] for fhx of cancer. Pt has been scheduled to see Raquel Sarna on 11/2 at 11/2 at 11am. Referring office will notify the pt.

## 2020-06-19 NOTE — Telephone Encounter (Signed)
Called patient and he is notified of appointment at [facility] on 11/2 at 71 for genetic counseling referral. Gave patient [phone] to call regarding how long this will take etc since I cant answer those questions and told him to call his insurance because I cant tell him if it is approved by insurance. Patient voiced understandig

## 2020-07-01 ENCOUNTER — Ambulatory Visit (HOSPITAL_BASED_OUTPATIENT_CLINIC_OR_DEPARTMENT_OTHER)
Admission: RE | Admit: 2020-07-01 | Discharge: 2020-07-01 | Disposition: A | Discharge status: Home / Self Care | Payer: Managed Care, Other (non HMO) | Source: Ambulatory Visit | Attending: Hematology & Oncology | Admitting: Hematology & Oncology

## 2020-07-01 ENCOUNTER — Inpatient Hospital Stay (HOSPITAL_BASED_OUTPATIENT_CLINIC_OR_DEPARTMENT_OTHER): Payer: Managed Care, Other (non HMO) | Admitting: Hematology & Oncology

## 2020-07-01 ENCOUNTER — Encounter (HOSPITAL_BASED_OUTPATIENT_CLINIC_OR_DEPARTMENT_OTHER): Payer: Self-pay

## 2020-07-01 ENCOUNTER — Inpatient Hospital Stay: Payer: Managed Care, Other (non HMO) | Attending: Hematology & Oncology

## 2020-07-01 ENCOUNTER — Telehealth: Payer: Self-pay | Admitting: Hematology & Oncology

## 2020-07-01 ENCOUNTER — Encounter: Payer: Self-pay | Admitting: Hematology & Oncology

## 2020-07-01 ENCOUNTER — Other Ambulatory Visit: Payer: Self-pay

## 2020-07-01 VITALS — BP 142/83 | HR 57 | Temp 98.2°F | Resp 18 | Wt 118.0 lb

## 2020-07-01 DIAGNOSIS — Z85068 Personal history of other malignant neoplasm of small intestine: Secondary | ICD-10-CM | POA: Insufficient documentation

## 2020-07-01 DIAGNOSIS — C689 Malignant neoplasm of urinary organ, unspecified: Secondary | ICD-10-CM

## 2020-07-01 DIAGNOSIS — C178 Malignant neoplasm of overlapping sites of small intestine: Secondary | ICD-10-CM

## 2020-07-01 DIAGNOSIS — C662 Malignant neoplasm of left ureter: Secondary | ICD-10-CM | POA: Insufficient documentation

## 2020-07-01 DIAGNOSIS — I82712 Chronic embolism and thrombosis of superficial veins of left upper extremity: Secondary | ICD-10-CM | POA: Insufficient documentation

## 2020-07-01 DIAGNOSIS — I82B11 Acute embolism and thrombosis of right subclavian vein: Secondary | ICD-10-CM | POA: Diagnosis present

## 2020-07-01 DIAGNOSIS — Z7901 Long term (current) use of anticoagulants: Secondary | ICD-10-CM | POA: Insufficient documentation

## 2020-07-01 LAB — CBC WITH DIFFERENTIAL (CANCER CENTER ONLY)
Abs Immature Granulocytes: 0.02 10*3/uL (ref 0.00–0.07)
Basophils Absolute: 0 10*3/uL (ref 0.0–0.1)
Basophils Relative: 1 %
Eosinophils Absolute: 0.3 10*3/uL (ref 0.0–0.5)
Eosinophils Relative: 6 %
HCT: 41 % (ref 39.0–52.0)
Hemoglobin: 13.2 g/dL (ref 13.0–17.0)
Immature Granulocytes: 0 %
Lymphocytes Relative: 27 %
Lymphs Abs: 1.3 10*3/uL (ref 0.7–4.0)
MCH: 29.7 pg (ref 26.0–34.0)
MCHC: 32.2 g/dL (ref 30.0–36.0)
MCV: 92.1 fL (ref 80.0–100.0)
Monocytes Absolute: 0.4 10*3/uL (ref 0.1–1.0)
Monocytes Relative: 7 %
Neutro Abs: 2.8 10*3/uL (ref 1.7–7.7)
Neutrophils Relative %: 59 %
Platelet Count: 110 10*3/uL — ABNORMAL LOW (ref 150–400)
RBC: 4.45 MIL/uL (ref 4.22–5.81)
RDW: 13.1 % (ref 11.5–15.5)
WBC Count: 4.8 10*3/uL (ref 4.0–10.5)
nRBC: 0 % (ref 0.0–0.2)

## 2020-07-01 LAB — CEA (IN HOUSE-CHCC): CEA (CHCC-In House): 5.25 ng/mL — ABNORMAL HIGH (ref 0.00–5.00)

## 2020-07-01 LAB — CMP (CANCER CENTER ONLY)
ALT: 12 U/L (ref 0–44)
AST: 19 U/L (ref 15–41)
Albumin: 4 g/dL (ref 3.5–5.0)
Alkaline Phosphatase: 108 U/L (ref 38–126)
Anion gap: 6 (ref 5–15)
BUN: 12 mg/dL (ref 6–20)
CO2: 26 mmol/L (ref 22–32)
Calcium: 9.3 mg/dL (ref 8.9–10.3)
Chloride: 110 mmol/L (ref 98–111)
Creatinine: 1.55 mg/dL — ABNORMAL HIGH (ref 0.61–1.24)
GFR, Estimated: 54 mL/min — ABNORMAL LOW (ref >60)
Glucose, Bld: 94 mg/dL (ref 70–99)
Potassium: 4.5 mmol/L (ref 3.5–5.1)
Sodium: 142 mmol/L (ref 135–145)
Total Bilirubin: 0.7 mg/dL (ref 0.3–1.2)
Total Protein: 6.6 g/dL (ref 6.5–8.1)

## 2020-07-01 LAB — LACTATE DEHYDROGENASE: LDH: 149 U/L (ref 98–192)

## 2020-07-01 MED ORDER — IOHEXOL 300 MG/ML  SOLN
100.0000 mL | Freq: Once | INTRAMUSCULAR | Status: AC | PRN
  Administered 2020-07-01: 100 mL via INTRAVENOUS

## 2020-07-01 NOTE — Progress Notes (Signed)
Hematology and Oncology Follow Up Visit  Lonnell Chaput [id number] 01/04/1971 49 y.o. 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 Xiang is here today for follow-up.  He looks quite good.  He feels good.  His hair is coming back quite nicely.  He is running once a week.  We did go ahead and do the CT scan today.  The CT scan shows some borderline pelvic lymph nodes.  These appear to be stable.  Otherwise, there is no evidence of any type of recurrent urothelial cancer.   We also did a Doppler of his left arm.  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  He had been on Xarelto.  This has been subsequently stopped.  He has had no bleeding.  He has had no cough or shortness of breath.  There has been no abdominal pain.  He has had no nausea or vomiting.  He has had no leg or arm swelling.  There is been no fever.  He still undecided about the coronavirus vaccine.    Overall, I would say his performance status is probably ECOG 1.  Medications:  Allergies as of 07/01/2020   No Known Allergies     Medication List       Accurate as of July 01, 2020 11:37 AM. If you have any questions, ask your nurse or doctor.        carboxymethylcellulose 0.5 % Soln Commonly known as: REFRESH PLUS Place 1 drop into both eyes 3 (three) times daily as needed (dry/irritated eyes.).   Clenpiq 10-3.5-12 MG-GM -GM/160ML Soln Generic drug: Sod Picosulfate-Mag Ox-Cit Acd Take 1 kit by mouth as directed.   co-enzyme  Q-10 30 MG capsule Take 30 mg by mouth as needed.   lipase/protease/amylase 36000 UNITS Cpep capsule Commonly known as: Creon Take 1 capsule (36,000 Units total) by mouth 3 (three) times daily with meals. What changed:   when to take this  reasons to take this  additional instructions   VITAMIN B-12 PO Take by mouth as needed.   VITAMIN D PO Take 1 tablet by mouth as needed.       Allergies: No Known Allergies  Past Medical History, Surgical history, Social history, and Family History were reviewed and updated.  Review of Systems: Review of Systems  Constitutional: Positive for weight loss.  HENT: Negative.   Eyes: Negative.   Respiratory: Negative.   Cardiovascular: Negative.   Gastrointestinal: Negative.   Genitourinary: Negative.   Musculoskeletal: Negative.   Skin: Negative.   Neurological: Negative.   Endo/Heme/Allergies: Negative.   Psychiatric/Behavioral: Negative.      Physical Exam:  weight is 118 lb (53.5 kg). His oral temperature is 98.2 F (36.8 C). His blood pressure is 142/83 (abnormal) and his pulse is 57 (abnormal). His respiration is 18 and oxygen saturation is 100%.   Wt Readings from Last 3 Encounters:  07/01/20 118 lb (53.5 kg)  06/12/20 116 lb (52.6 kg)  03/25/20 112 lb (50.8 kg)    Physical Exam Vitals reviewed.  HENT:  Head: Normocephalic and atraumatic.  Eyes:     Pupils: Pupils are equal, round, and reactive to light.  Cardiovascular:     Rate and Rhythm: Normal rate and regular rhythm.     Heart sounds: Normal heart sounds.  Pulmonary:     Effort: Pulmonary effort is normal.     Breath sounds: Normal breath sounds.  Abdominal:     General: Bowel sounds are normal.     Palpations: Abdomen is soft.     Comments: Abdominal exam shows a soft abdomen.  He has well-healed laparoscopic scars from his ureteral surgery.  He has a well-healed laparotomy scar from the small bowel cancer.  He has good bowel sounds.  There is no  fluid wave.  There is no palpable liver or spleen tip.  Musculoskeletal:        General: No tenderness or deformity. Normal range of motion.     Cervical back: Normal range of motion.  Lymphadenopathy:     Cervical: No cervical adenopathy.  Skin:    General: Skin is warm and dry.     Findings: No erythema or rash.  Neurological:     Mental Status: He is alert and oriented to person, place, and time.  Psychiatric:        Behavior: Behavior normal.        Thought Content: Thought content normal.        Judgment: Judgment normal.      Lab Results  Component Value Date   WBC 4.8 07/01/2020   HGB 13.2 07/01/2020   HCT 41.0 07/01/2020   MCV 92.1 07/01/2020   PLT 110 (L) 07/01/2020   Lab Results  Component Value Date   FERRITIN 85 09/18/2019   IRON 95 09/18/2019   TIBC 331 09/18/2019   UIBC 237 09/18/2019   IRONPCTSAT 29 09/18/2019   Lab Results  Component Value Date   RBC 4.45 07/01/2020   No results found for: KPAFRELGTCHN, LAMBDASER, KAPLAMBRATIO No results found for: IGGSERUM, IGA, IGMSERUM No results found for: Kathrynn Ducking, MSPIKE, SPEI   Chemistry      Component Value Date/Time   NA 142 07/01/2020 0955   NA 147 (H) 08/06/2017 0816   NA 144 02/04/2017 0758   K 4.5 07/01/2020 0955   K 4.6 08/06/2017 0816   K 4.6 02/04/2017 0758   CL 110 07/01/2020 0955   CL 104 08/06/2017 0816   CO2 26 07/01/2020 0955   CO2 30 08/06/2017 0816   CO2 25 02/04/2017 0758   BUN 12 07/01/2020 0955   BUN 12 08/06/2017 0816   BUN 14.2 02/04/2017 0758   CREATININE 1.55 (H) 07/01/2020 0955   CREATININE 0.9 08/06/2017 0816   CREATININE 0.9 02/04/2017 0758      Component Value Date/Time   CALCIUM 9.3 07/01/2020 0955   CALCIUM 9.0 08/06/2017 0816   CALCIUM 8.9 02/04/2017 0758   ALKPHOS 108 07/01/2020 0955   ALKPHOS 108 (H) 08/06/2017 0816   ALKPHOS 113 02/04/2017 0758   AST 19 07/01/2020 0955   AST 24 02/04/2017 0758   ALT 12  07/01/2020 0955   ALT 27 08/06/2017 0816   ALT 20 02/04/2017 0758   BILITOT 0.7 07/01/2020 0955   BILITOT 0.91 02/04/2017 0758       Impression and Plan: Mr.Gengis 49yoChinese gentleman with history oflocally advanced small bowel cancer. He underwent resectionand had9 positive lymph nodes putting him atrisk for recurrence. He underwent aggressive adjuvant therapy with both chemotherapy  and radiation therapy completed inOctober 2015.  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 He hashigh MSI and high TMB levels.  We tried him on adjuvant chemotherapy with cis-platinum/gemcitabine.  After 2 cycles, we stopped secondary to hepatotoxicity.  I would like to repeat scans on him in late December.  I just want to make sure that we follow-up with his lymph nodes in the pelvis.  I still do not think that this is going to be a problem but yet we need to make sure.  We will plan to see him back at that time.  If everything looks fine, then we will do a scan in 4 months.     Volanda Napoleon, MD 10/25/202111:37 AM

## 2020-07-01 NOTE — Telephone Encounter (Signed)
Appointments scheduled and patient needs CT scan same day as follow up 12/29. Patient will get contrast from Imaging Department whe he leaves office today per 10/25 los

## 2020-07-05 ENCOUNTER — Telehealth: Payer: Self-pay | Admitting: Genetic Counselor

## 2020-07-05 NOTE — Telephone Encounter (Signed)
Returned Keith [name] call to discuss his question about his upcoming genetic counseling appointment. He would like to know if the appointment will be covered by his insurance. Advised Keith [name] to reach out to his insurance company to find out if his plan will cover the appointment, and provided the CPT code for genetic counseling [phone]). He is welcome to call back should he have any other questions.

## 2020-07-08 ENCOUNTER — Encounter: Payer: Self-pay | Admitting: Gastroenterology

## 2020-07-09 ENCOUNTER — Inpatient Hospital Stay: Payer: Managed Care, Other (non HMO) | Attending: Hematology & Oncology | Admitting: Genetic Counselor

## 2020-07-09 ENCOUNTER — Other Ambulatory Visit: Payer: Self-pay

## 2020-07-09 ENCOUNTER — Encounter: Payer: Self-pay | Admitting: Genetic Counselor

## 2020-07-09 ENCOUNTER — Inpatient Hospital Stay: Payer: Managed Care, Other (non HMO)

## 2020-07-09 DIAGNOSIS — C689 Malignant neoplasm of urinary organ, unspecified: Secondary | ICD-10-CM | POA: Diagnosis not present

## 2020-07-09 DIAGNOSIS — Z800 Family history of malignant neoplasm of digestive organs: Secondary | ICD-10-CM

## 2020-07-09 DIAGNOSIS — C178 Malignant neoplasm of overlapping sites of small intestine: Secondary | ICD-10-CM | POA: Diagnosis not present

## 2020-07-09 DIAGNOSIS — Z8049 Family history of malignant neoplasm of other genital organs: Secondary | ICD-10-CM

## 2020-07-10 ENCOUNTER — Encounter: Payer: Self-pay | Admitting: Genetic Counselor

## 2020-07-10 NOTE — Progress Notes (Signed)
REFERRING PROVIDER: Lavena Bullion, DO [address] [place],  [place] [address]  PRIMARY PROVIDER:  Martinique, Betty G, MD  PRIMARY REASON FOR VISIT:  1. 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 Urothelial cancer ([place])   3. Family history of cancer of genitourinary system   4. Family history of esophageal cancer      HISTORY OF PRESENT ILLNESS:   Keith [name], a 49 y.o. male, was seen for a [place] cancer genetics consultation at the request of Keith [name] due to a personal and family history of cancer.  Keith [name] presents to clinic [name] to discuss the possibility of a hereditary predisposition to cancer, genetic testing, and to further clarify his future cancer risks, as well as potential cancer risks for family members.   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 The treatment plan included chemotherapy, radiation therapy, and surgery. 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   CANCER HISTORY:  Oncology History  Urothelial cancer ([place])  12/20/2019 Initial Diagnosis   Urothelial cancer ([place])   01/11/2020 -  Chemotherapy   The patient had dexamethasone (DECADRON) 4 MG tablet, 1 of 1 cycle, Start date: --, End date: -- palonosetron (ALOXI) injection 0.25 mg, 0.25 mg, Intravenous,  Once, 2 of 4 cycles Administration: 0.25 mg (01/12/2020), 0.25 mg (02/02/2020) CISplatin (PLATINOL) 81 mg in sodium chloride 0.9 % 250 mL chemo infusion, 52 mg/m2 = 109 mg, Intravenous,  Once, 2 of 4 cycles Administration: 81 mg (01/12/2020), 109 mg (02/02/2020) gemcitabine (GEMZAR) 1,600 mg in sodium chloride 0.9 % 250 mL chemo infusion, 1,558 mg, Intravenous,  Once, 2 of 4 cycles Administration: 1,600 mg (01/11/2020), 1,600 mg (02/01/2020) fosaprepitant (EMEND) 150 mg in sodium chloride 0.9 % 145 mL IVPB, 150 mg, Intravenous,  Once, 2 of 4  cycles Administration: 150 mg (01/12/2020), 150 mg (02/02/2020)  for chemotherapy treatment.       Past Medical History:  Diagnosis Date   Acute thrombosis of cephalic vein, right 01/01/8340   Cancer of small intestine, including duodenum ([place]) 10/26/2013   small intestine adenocarcinoma stage IIIc    CHF (congestive heart failure) ([place])    patient denies   Chronic hepatitis B ([place])    Diabetes mellitus without complication ([place])    patient denies   Family history of cancer of genitourinary system    Family history of esophageal cancer    GERD (gastroesophageal reflux disease)    remote history   Hydronephrosis of left kidney    Hypertension    no medications at this time   Impaired fasting glucose    Lung nodules    2 mm nodule in the posterior left lower lobe (4/115), stable. 4 mm ground-glass nodule in the apical segment right upper lobe (4/30), unchanged as well.   Platelets decreased ([place])    Radiation 01/31/14-03/06/14   upper abdomen/surgical bed   Urothelial cancer ([place]) 12/20/2019    Past Surgical History:  Procedure Laterality Date   ABDOMINAL EXPLORATION SURGERY  09/19/2013   done in [place]     tumor excision on back   COLONOSCOPY  2015   CYSTOSCOPY WITH RETROGRADE PYELOGRAM, URETEROSCOPY AND STENT PLACEMENT Left 10/26/2019   Procedure: CYSTOSCOPY WITH LEFT RETROGRADE PYELOGRAM, URETEROSCOPY;  Surgeon: Keith Gallo, MD;  Location:  [facility];  Service: Urology;  Laterality: Left;  1 HR   PANCREATICODUODENECTOMY  09/19/2013   PARTIAL GASTRECTOMY  09/19/2013   UPPER GI ENDOSCOPY     XI ROBOTICALLY ASSISTED LAPAROSCOPIC URETERAL RE-IMPLANTATION   12/06/2019    Social History   Socioeconomic History   Marital status: Married    Spouse name: Keith [name]    Number of children: 2   Years of education: 16   Highest education level: Not on file  Occupational History   Occupation: TECHNICAL SUPPORT      Employer: [employer]    Comment: [employer]   Tobacco Use   Smoking status: Former Smoker    Packs/day: 0.25    Years: 25.00    Pack years: 6.25    Types: Cigarettes    Start date: 09/07/1986    Quit date: 09/13/2013    Years since quitting: 6.8   Smokeless tobacco: Never Used   Tobacco comment: quit January of 2015  Vaping Use   Vaping Use: Never used  Substance and Sexual Activity   Alcohol use: No    Alcohol/week: 0.0 standard drinks    Comment: He drinks rarely    Drug use: No   Sexual activity: Yes    Birth control/protection: Surgical    Comment: Wife had a BTL  Other Topics Concern   Not on file  Social History Narrative   Marital Status: Married Keith [name])   Children:  Son Keith [name]), Daughter Keith [name])    Pets: None   Living Situation: Lives with spouse, son and daughter    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  He has smoked at least 20 years.     Alcohol Use:  Rarely    Exercise:  Limited    Hobbies: Music            Social Determinants of Health   Financial Resource Strain:    Difficulty of Paying Living Expenses: Not on file  Food Insecurity:    Worried About [place] in the Last Year: Not on file   Ran Out of Food in the Last Year: Not on file  Transportation Needs:    Lack of Transportation (Medical): Not on file   Lack of Transportation (Non-Medical): Not on file  Physical Activity:    Days of Exercise per Week: Not on file   Minutes of Exercise per Session: Not on file  Stress:    Feeling of Stress : Not on file  Social Connections:    Frequency of Communication with Friends and Family: Not on file   Frequency of Social Gatherings with Friends and Family: Not on file   Attends Religious Services: Not on file   Active Member of Clubs or Organizations: Not on file   Attends Archivist Meetings:  Not on file   Marital Status: Not on file     FAMILY HISTORY:  We obtained a detailed, 4-generation family history.  Significant diagnoses are listed below: Family History  Problem Relation Age of Onset   Cancer Mother 38       Ureter   Hypertension Other    Colon cancer Sister 2   Esophageal cancer Father 66   Colon cancer Cousin 18       paternal first cousin   Cancer Sister 53       Ureter   Keith [name] has one son (  age 78) and one daughter (age 38). He has two brothers and had two sisters. One sister died at the age of 24 from what he believes was colon cancer. His other sister was diagnosed with ureteral cancer at the age of 23.   Keith [name] mother died at the age of 45 from ureteral cancer that was diagnosed at age 77. He had two maternal uncles. His maternal grandmother died in her 67s, and his maternal grandfather died in his 58s. There are no other known diagnoses of cancer on the maternal side of the family.  Keith [name] father died at the age of 52 from esophageal cancer that was diagnosed at age 73. His father was not a smoker. Keith [name] had two paternal uncles. One paternal first cousin was diagnosed with colon cancer at the age of 19. His paternal grandmother died at age 8 and his paternal grandfather died at age 21. There are no other known diagnoses of cancer on the paternal side of the family.  Keith [name] is unaware of previous family history of genetic testing for hereditary cancer risks. Patient's maternal and paternal ancestors are of [place] descent. There is no reported Ashkenazi Jewish ancestry. There is no known consanguinity.  GENETIC COUNSELING ASSESSMENT: Keith [name] is a 49 y.o. male with a personal history of duodenal cancer and MSI-High urothelial cancer, as well as a family history of early-onset colon cancer and ureteral cancer, which is suggestive of a hereditary cancer syndrome and predisposition to cancer. We, therefore, discussed and recommended the  following at [name]'s visit.   DISCUSSION: We discussed that approximately 5-10% of cancer is hereditary, meaning that it is due to a mutation in a single gene that is passed down from generation to generation in a family. Most cases of hereditary urothelial cancer and duodenal cancer are associated with Lynch syndrome. Lynch syndrome is a hereditary cancer predisposition syndrome that mainly increases the risk for colorectal and uterine cancers, in addition to other cancer such as duodenal and urothelial cancers. We reviewed that Keith [name] tumor was found to be MSI-High. We discussed that this is a screening test that is done on tumors to predict the likelihood that someone has Lynch syndrome. A person with a tumor that is MSI-High has a greater likelihood to have Lynch syndrome.   We discussed that identifying a hereditary cancer syndrome such as Lynch syndrome is beneficial for several reasons, including knowing about other cancer risks, identifying potential screening and risk-reduction options that may be appropriate, and to understand if other family members could be at risk for cancer and allow them to undergo genetic testing.  We reviewed the characteristics, features and inheritance patterns of hereditary cancer syndromes. We also discussed genetic testing, including the appropriate family members to test, the process of testing, insurance coverage and turn-around-time for results. We discussed the implications of a negative, positive and/or variant of uncertain significant result. We recommended Keith [name] pursue genetic testing for the [facility] CustomNext-Cancer + RNAinsight gene panel.   The CustomNext-Cancer+RNAinsight panel offered by [facility] includes sequencing and rearrangement analysis for the following 47 genes:  APC, ATM, AXIN2, BARD1, BMPR1A, BRCA1, BRCA2, BRIP1, CDH1, CDK4, CDKN2A, CHEK2, DICER1, EPCAM, GREM1, HOXB13, MEN1, MLH1, MSH2, MSH3, MSH6, MUTYH, NBN, NF1, NF2, NTHL1, PALB2,  PMS2, POLD1, POLE, PTEN, RAD51C, RAD51D, RECQL, RET, SDHA, SDHAF2, SDHB, SDHC, SDHD, SMAD4, SMARCA4, STK11, TP53, TSC1, TSC2, and VHL.  RNA data is routinely analyzed for use in variant interpretation for all genes.  Based on  Keith [name] personal and family history of cancer, he meets medical criteria for genetic testing. Despite that he meets criteria, there may still be an out of pocket cost. We discussed that if his out of pocket cost for testing is over $100, the laboratory will reach out to let him know. If the out of pocket cost of testing is less than $100 he will be billed by the genetic testing laboratory.   PLAN: After considering the risks, benefits, and limitations, Keith [name] provided informed consent to pursue genetic testing and the blood sample was sent to [facility] for analysis of the CustomNext-Cancer + RNAinsight. Results should be available within approximately two-three weeks' time, at which point they will be disclosed by telephone to Keith [name], as will any additional recommendations warranted by these results. Mr. Lamantia will receive a summary of his genetic counseling visit and a copy of his results once available. This information will also be available in Epic.   Mr. Jeppsen questions were answered to his satisfaction [name]. Our contact information was provided should additional questions or concerns arise. Thank you for the referral and allowing [place] to share in the care of your patient.   Clint Guy, Kiana, [facility] Licensed, Certified Dispensing [email or web address] Phone: [phone]  The patient was seen for a total of 35 minutes in face-to-face genetic counseling.  This patient was discussed with Drs. Magrinat, Lindi Adie and/or Burr Medico who agrees with the above.    _______________________________________________________________________ For Office Staff:  Number of people involved in session: 1 Was an Intern/ student involved with case: no

## 2020-07-18 ENCOUNTER — Telehealth: Payer: Self-pay | Admitting: Gastroenterology

## 2020-07-19 ENCOUNTER — Other Ambulatory Visit: Payer: Self-pay | Admitting: Gastroenterology

## 2020-07-19 LAB — SARS CORONAVIRUS 2 (TAT 6-24 HRS): SARS Coronavirus 2: NEGATIVE

## 2020-07-23 ENCOUNTER — Encounter: Payer: Self-pay | Admitting: Gastroenterology

## 2020-07-23 ENCOUNTER — Ambulatory Visit (AMBULATORY_SURGERY_CENTER): Payer: Managed Care, Other (non HMO) | Admitting: Gastroenterology

## 2020-07-23 ENCOUNTER — Other Ambulatory Visit: Payer: Self-pay

## 2020-07-23 VITALS — BP 129/93 | HR 50 | Temp 97.8°F | Resp 12 | Ht 66.0 in | Wt 118.0 lb

## 2020-07-23 DIAGNOSIS — Z809 Family history of malignant neoplasm, unspecified: Secondary | ICD-10-CM | POA: Diagnosis not present

## 2020-07-23 DIAGNOSIS — Z85038 Personal history of other malignant neoplasm of large intestine: Secondary | ICD-10-CM

## 2020-07-23 DIAGNOSIS — K289 Gastrojejunal ulcer, unspecified as acute or chronic, without hemorrhage or perforation: Secondary | ICD-10-CM | POA: Diagnosis not present

## 2020-07-23 DIAGNOSIS — D122 Benign neoplasm of ascending colon: Secondary | ICD-10-CM | POA: Diagnosis not present

## 2020-07-23 DIAGNOSIS — C169 Malignant neoplasm of stomach, unspecified: Secondary | ICD-10-CM

## 2020-07-23 DIAGNOSIS — K2970 Gastritis, unspecified, without bleeding: Secondary | ICD-10-CM

## 2020-07-23 DIAGNOSIS — K259 Gastric ulcer, unspecified as acute or chronic, without hemorrhage or perforation: Secondary | ICD-10-CM

## 2020-07-23 DIAGNOSIS — R109 Unspecified abdominal pain: Secondary | ICD-10-CM

## 2020-07-23 DIAGNOSIS — Z1211 Encounter for screening for malignant neoplasm of colon: Secondary | ICD-10-CM

## 2020-07-23 DIAGNOSIS — K641 Second degree hemorrhoids: Secondary | ICD-10-CM

## 2020-07-23 DIAGNOSIS — K2990 Gastroduodenitis, unspecified, without bleeding: Secondary | ICD-10-CM | POA: Diagnosis not present

## 2020-07-23 DIAGNOSIS — D120 Benign neoplasm of cecum: Secondary | ICD-10-CM | POA: Diagnosis not present

## 2020-07-23 MED ORDER — SUCRALFATE 1 GM/10ML PO SUSP: 1.0000 g | Freq: Two times a day (BID) | ORAL | 1 refills | Status: DC

## 2020-07-23 MED ORDER — SODIUM CHLORIDE 0.9 % IV SOLN: 500.0000 mL | Freq: Once | INTRAVENOUS | Status: DC

## 2020-07-23 MED ORDER — PANTOPRAZOLE SODIUM 40 MG PO TBEC: 40.0000 mg | DELAYED_RELEASE_TABLET | Freq: Two times a day (BID) | ORAL | 0 refills | Status: DC

## 2020-07-23 NOTE — Op Note (Signed)
[facility] Patient Name: Keith [name] Procedure Date: 07/23/2020 8:32 AM MRN: [id number] Endoscopist: Gerrit Heck , MD Age: 49 Referring MD:  Date of Birth: 08-11-1971 Gender: Male Account #: [id number] Procedure:                Upper GI endoscopy Indications:              Generalized abdominal pain, Family history of                            gastric cancer, Personal history of malignant                            neoplasm of the GI tract                           49 y.o. 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                           Family history notable for:                           - Father: Thyroid CA                           - Mother: Ureteral CA                           - Sister: Ureteral CA                           - Sister: ?Stomach CA (died 30+ years ago)                           -  Cousin: Colon CA                           Was recently referred to the [facility] for                            further evaluation. Medicines:                Monitored Anesthesia Care Procedure:                Pre-Anesthesia Assessment:                           - Prior to the procedure, a History and Physical                            was performed, and patient medications and                            allergies were reviewed. The patient's tolerance of                            previous anesthesia was  also reviewed. The risks                            and benefits of the procedure and the sedation                            options and risks were discussed with the patient.                            All questions were answered, and informed consent                            was obtained. Prior Anticoagulants: The patient has                            taken no previous anticoagulant or antiplatelet                            agents. ASA Grade Assessment: II - A patient with                            mild systemic disease. After reviewing the risks                            and benefits, the patient was deemed in                            satisfactory condition to undergo the procedure.                           After obtaining informed consent, the endoscope was  passed under direct vision. Throughout the                            procedure, the patient's blood pressure, pulse, and                            oxygen saturations were monitored continuously. The                            Endoscope was introduced through the mouth, and                            advanced to the second part of duodenum. The upper                            GI endoscopy was accomplished without difficulty.                            The patient tolerated the procedure well. Scope In: Scope Out: Findings:                 The examined esophagus was normal.                           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 This was characterized by 10 mm marginal                            ulcer. Biopsies were taken with a cold forceps for                            histology. Estimated blood loss was minimal.                           Localized inflammation characterized by congestion                            (edema) and erythema was found in the gastric body,                            immediately proximal to the anastamosis.  Biopsies                            were taken with a cold forceps for histology.                            Estimated blood loss was minimal.                           The examined small bowel (both afferent and  efferent limbs) was normal appearing. Complications:            No immediate complications. Estimated Blood Loss:     Estimated blood loss was minimal. Impression:               - Normal esophagus.                           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 Biopsied.                           - Gastritis. Biopsied.                           - Normal examined jejunum. Recommendation:           - Patient has a contact number available for                            emergencies. The signs and symptoms of potential                            delayed complications were discussed with the                            patient. Return to normal activities tomorrow.                            Written discharge instructions were provided to the                            patient.                           - Resume previous diet.                           - Continue present medications.                           - Await pathology results.                           - Perform a colonoscopy today.                           - Use Protonix (pantoprazole) 40 mg PO BID for 6                            weeks.                           - Use sucralfate suspension 1 gram PO BID for 4  weeks.                           - Follow-up in the GI Clinic. Gerrit Heck, MD 07/23/2020 9:32:55 AM

## 2020-07-23 NOTE — Op Note (Signed)
Keith [name] Patient Name: Keith [name] Procedure Date: 07/23/2020 8:31 AM MRN: [id number] Endoscopist: Gerrit Heck , MD Age: 49 Referring MD:  Date of Birth: May 25, 1971 Gender: Male Account #: [id number] Procedure:                Colonoscopy Indications:              Screening for colon cancer: Family history of                            colorectal cancer in distant relative(s) before age                            57                           48 y.o. 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                           Family history notable for:                           - Father: Thyroid CA                           - Mother: Ureteral CA                           - Sister: Ureteral CA                           - Sister: ?Stomach CA (died 30+ years ago)                           -  Cousin: Colon CA                           Was recently referred to the [facility] for                            further evaluation. Medicines:                Monitored Anesthesia Care Procedure:                Pre-Anesthesia Assessment:                           - Prior to the procedure, a History and Physical                            was performed, and patient medications and                            allergies were reviewed. The patient's tolerance of                            previous anesthesia was also reviewed. The risks                             and benefits of the procedure and the sedation                            options and risks were discussed with the patient.                            All questions were answered, and informed consent                            was obtained. Prior Anticoagulants: The patient has                            taken no previous anticoagulant or antiplatelet                            agents. ASA Grade Assessment: II - A patient with                            mild systemic disease. After reviewing the risks                            and benefits, the patient was deemed in                            satisfactory condition to undergo the procedure.                           After obtaining informed consent, the colonoscope  was passed under direct vision. Throughout the                            procedure, the patient's blood pressure, pulse, and                            oxygen saturations were monitored continuously. The                            Colonoscope was introduced through the anus and                            advanced to the the terminal ileum. The colonoscopy                            was technically difficult and complex due to a                            tortuous colon. The patient tolerated the procedure                            well. The quality of the bowel preparation was                            good. The terminal ileum, ileocecal valve,                            appendiceal orifice, and rectum were photographed. Scope In: 8:54:52 AM Scope Out: 9:20:04 AM Scope Withdrawal Time: 0 hours 15 minutes 23 seconds  Total Procedure Duration: 0 hours 25 minutes 12 seconds  Findings:                 The perianal and digital rectal examinations were                            normal.                           Three sessile polyps were found in the ascending                            colon and cecum. The polyps were 4 to  8 mm in size.                            These polyps were removed with a cold snare.                            Resection and retrieval were complete. Estimated                            blood loss was minimal.                           The sigmoid colon was significantly tortuous.  Advancing the scope required applying abdominal                            pressure.                           Non-bleeding internal hemorrhoids were found during                            retroflexion. The hemorrhoids were medium-sized.                           The terminal ileum appeared normal. Complications:            No immediate complications. Estimated Blood Loss:     Estimated blood loss was minimal. Impression:               - Three 4 to 8 mm polyps in the ascending colon and                            in the cecum, removed with a cold snare. Resected                            and retrieved.                           - Tortuous colon likely secondary to prior                            intraabdominal surgeries and radiation.                           - Non-bleeding internal hemorrhoids.                           - The examined portion of the ileum was normal. Recommendation:           - Patient has a contact number available for                            emergencies. The signs and symptoms of potential                            delayed complications were discussed with the                            patient. Return to normal activities tomorrow.                            Written discharge instructions were provided to the                            patient.                           - Resume previous diet.                           -  Continue present medications.                           - Await pathology results.                           - Repeat colonoscopy in 3 - 5 years for                            surveillance based on pathology results and pending                             Genetics evaluation.                           - Return to GI clinic at appointment to be                            scheduled. Gerrit Heck, MD 07/23/2020 9:37:28 AM

## 2020-07-23 NOTE — Progress Notes (Signed)
VS-CW 

## 2020-07-23 NOTE — Progress Notes (Signed)
Called to room to assist during endoscopic procedure.  Patient ID and intended procedure confirmed with present staff. Received instructions for my participation in the procedure from the performing physician.  

## 2020-07-23 NOTE — Progress Notes (Signed)
PT taken to PACU. Monitors in place. VSS. Report given to RN. 

## 2020-07-23 NOTE — Patient Instructions (Signed)
Please read handouts provided. Await pathology results. Use Protonix ( pantoprazole ) 40 mg twice daily for 6 weeks. Use sucralfate suspension 1 gram twice daily for 4 weeks. Follow-up in GI clinic.      YOU HAD AN ENDOSCOPIC PROCEDURE TODAY AT [facility]:   Refer to the procedure report that was given to you for any specific questions about what was found during the examination.  If the procedure report does not answer your questions, please call your gastroenterologist to clarify.  If you requested that your care partner not be given the details of your procedure findings, then the procedure report has been included in a sealed envelope for you to review at your convenience later.  YOU SHOULD EXPECT: Some feelings of bloating in the abdomen. Passage of more gas than usual.  Walking can help get rid of the air that was put into your GI tract during the procedure and reduce the bloating. If you had a lower endoscopy (such as a colonoscopy or flexible sigmoidoscopy) you may notice spotting of blood in your stool or on the toilet paper. If you underwent a bowel prep for your procedure, you may not have a normal bowel movement for a few days.  Please Note:  You might notice some irritation and congestion in your nose or some drainage.  This is from the oxygen used during your procedure.  There is no need for concern and it should clear up in a day or so.  SYMPTOMS TO REPORT IMMEDIATELY:   Following lower endoscopy (colonoscopy or flexible sigmoidoscopy):  Excessive amounts of blood in the stool  Significant tenderness or worsening of abdominal pains  Swelling of the abdomen that is new, acute  Fever of 100F or higher   Following upper endoscopy (EGD)  Vomiting of blood or coffee ground material  New chest pain or pain under the shoulder blades  Painful or persistently difficult swallowing  New shortness of breath  Fever of 100F or higher  Black, tarry-looking  stools  For urgent or emergent issues, a gastroenterologist can be reached at any hour by calling [phone]. Do not use MyChart messaging for urgent concerns.    DIET:  We do recommend a small meal at first, but then you may proceed to your regular diet.  Drink plenty of fluids but you should avoid alcoholic beverages for 24 hours.  ACTIVITY:  You should plan to take it easy for the rest of today and you should NOT DRIVE or use heavy machinery until tomorrow (because of the sedation medicines used during the test).    FOLLOW UP: Our staff will call the number listed on your records 48-72 hours following your procedure to check on you and address any questions or concerns that you may have regarding the information given to you following your procedure. If we do not reach you, we will leave a message.  We will attempt to reach you two times.  During this call, we will ask if you have developed any symptoms of COVID 19. If you develop any symptoms (ie: fever, flu-like symptoms, shortness of breath, cough etc.) before then, please call [phone].  If you test positive for Covid 19 in the 2 weeks post procedure, please call and report this information to [place].    If any biopsies were taken you will be contacted by phone or by letter within the next 1-3 weeks.  Please call us at [phone] if you have not heard about the  biopsies in 3 weeks.    SIGNATURES/CONFIDENTIALITY: You and/or your care partner have signed paperwork which will be entered into your electronic medical record.  These signatures attest to the fact that that the information above on your After Visit Summary has been reviewed and is understood.  Full responsibility of the confidentiality of this discharge information lies with you and/or your care-partner.

## 2020-07-25 ENCOUNTER — Telehealth: Payer: Self-pay

## 2020-07-25 NOTE — Telephone Encounter (Signed)
  Follow up Call-  Call back number 07/23/2020  Post procedure Call Back phone  # [phone]  Permission to leave phone message Yes  Some recent data might be hidden     Patient questions:  Do you have a fever, pain , or abdominal swelling? No. Pain Score  0 *  Have you tolerated food without any problems? Yes.    Have you been able to return to your normal activities? Yes.    Do you have any questions about your discharge instructions: Diet   No. Medications  No. Follow up visit  No.  Do you have questions or concerns about your Care? No.  Actions: * If pain score is 4 or above: No action needed, pain <4.  1. Have you developed a fever since your procedure? no  2.   Have you had an respiratory symptoms (SOB or cough) since your procedure? no  3.   Have you tested positive for COVID 19 since your procedure no  4.   Have you had any family members/close contacts diagnosed with the COVID 19 since your procedure?  no   If yes to any of these questions please route to Joylene John, RN and Joella Prince, RN

## 2020-07-25 NOTE — Telephone Encounter (Signed)
  Follow up Call-  Call back number 07/23/2020  Post procedure Call Back phone  # [phone]  Permission to leave phone message Yes  Some recent data might be hidden     1st follow up call made.  NALM

## 2020-07-30 ENCOUNTER — Telehealth: Payer: Self-pay | Admitting: Gastroenterology

## 2020-07-30 NOTE — Telephone Encounter (Signed)
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 Patient voiced understanding.

## 2020-07-30 NOTE — Telephone Encounter (Signed)
Pt states he missed a call from Dr Bryan Lemma to please return his call, no documentation was found and pt did not disclose any further information.

## 2020-08-06 ENCOUNTER — Encounter: Payer: Self-pay | Admitting: *Deleted

## 2020-08-06 DIAGNOSIS — Z1379 Encounter for other screening for genetic and chromosomal anomalies: Secondary | ICD-10-CM | POA: Insufficient documentation

## 2020-08-06 NOTE — Progress Notes (Signed)
[facility] notified that Dr Marin Olp would like a MSI/MMR added to [place].

## 2020-08-07 ENCOUNTER — Encounter: Payer: Self-pay | Admitting: Hematology & Oncology

## 2020-08-07 ENCOUNTER — Inpatient Hospital Stay (HOSPITAL_BASED_OUTPATIENT_CLINIC_OR_DEPARTMENT_OTHER): Payer: Managed Care, Other (non HMO) | Admitting: Hematology & Oncology

## 2020-08-07 ENCOUNTER — Inpatient Hospital Stay: Payer: Managed Care, Other (non HMO) | Attending: Hematology & Oncology

## 2020-08-07 ENCOUNTER — Other Ambulatory Visit: Payer: Self-pay

## 2020-08-07 VITALS — BP 123/84 | HR 56 | Temp 98.0°F | Resp 20 | Wt 122.4 lb

## 2020-08-07 DIAGNOSIS — C169 Malignant neoplasm of stomach, unspecified: Secondary | ICD-10-CM | POA: Diagnosis not present

## 2020-08-07 DIAGNOSIS — C662 Malignant neoplasm of left ureter: Secondary | ICD-10-CM | POA: Insufficient documentation

## 2020-08-07 DIAGNOSIS — C178 Malignant neoplasm of overlapping sites of small intestine: Secondary | ICD-10-CM

## 2020-08-07 DIAGNOSIS — C162 Malignant neoplasm of body of stomach: Secondary | ICD-10-CM | POA: Diagnosis not present

## 2020-08-07 DIAGNOSIS — Z7901 Long term (current) use of anticoagulants: Secondary | ICD-10-CM | POA: Diagnosis not present

## 2020-08-07 DIAGNOSIS — I82712 Chronic embolism and thrombosis of superficial veins of left upper extremity: Secondary | ICD-10-CM | POA: Insufficient documentation

## 2020-08-07 DIAGNOSIS — Z85068 Personal history of other malignant neoplasm of small intestine: Secondary | ICD-10-CM | POA: Insufficient documentation

## 2020-08-07 LAB — CBC WITH DIFFERENTIAL (CANCER CENTER ONLY)
Abs Immature Granulocytes: 0.01 10*3/uL (ref 0.00–0.07)
Basophils Absolute: 0 10*3/uL (ref 0.0–0.1)
Basophils Relative: 1 %
Eosinophils Absolute: 0.2 10*3/uL (ref 0.0–0.5)
Eosinophils Relative: 6 %
HCT: 37.4 % — ABNORMAL LOW (ref 39.0–52.0)
Hemoglobin: 12.4 g/dL — ABNORMAL LOW (ref 13.0–17.0)
Immature Granulocytes: 0 %
Lymphocytes Relative: 25 %
Lymphs Abs: 1 10*3/uL (ref 0.7–4.0)
MCH: 30.3 pg (ref 26.0–34.0)
MCHC: 33.2 g/dL (ref 30.0–36.0)
MCV: 91.4 fL (ref 80.0–100.0)
Monocytes Absolute: 0.3 10*3/uL (ref 0.1–1.0)
Monocytes Relative: 7 %
Neutro Abs: 2.4 10*3/uL (ref 1.7–7.7)
Neutrophils Relative %: 61 %
Platelet Count: 119 10*3/uL — ABNORMAL LOW (ref 150–400)
RBC: 4.09 MIL/uL — ABNORMAL LOW (ref 4.22–5.81)
RDW: 13.1 % (ref 11.5–15.5)
WBC Count: 4 10*3/uL (ref 4.0–10.5)
nRBC: 0 % (ref 0.0–0.2)

## 2020-08-07 LAB — CMP (CANCER CENTER ONLY)
ALT: 14 U/L (ref 0–44)
AST: 17 U/L (ref 15–41)
Albumin: 3.9 g/dL (ref 3.5–5.0)
Alkaline Phosphatase: 107 U/L (ref 38–126)
Anion gap: 6 (ref 5–15)
BUN: 15 mg/dL (ref 6–20)
CO2: 27 mmol/L (ref 22–32)
Calcium: 9 mg/dL (ref 8.9–10.3)
Chloride: 108 mmol/L (ref 98–111)
Creatinine: 1.66 mg/dL — ABNORMAL HIGH (ref 0.61–1.24)
GFR, Estimated: 50 mL/min — ABNORMAL LOW (ref >60)
Glucose, Bld: 152 mg/dL — ABNORMAL HIGH (ref 70–99)
Potassium: 4.6 mmol/L (ref 3.5–5.1)
Sodium: 141 mmol/L (ref 135–145)
Total Bilirubin: 0.5 mg/dL (ref 0.3–1.2)
Total Protein: 6.4 g/dL — ABNORMAL LOW (ref 6.5–8.1)

## 2020-08-07 LAB — LACTATE DEHYDROGENASE: LDH: 120 U/L (ref 98–192)

## 2020-08-07 NOTE — Progress Notes (Signed)
Hematology and Oncology Follow Up Visit  Keith [name] [id number] 1971-04-15 49 y.o. 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  Unfortunately, looks like we now have a third cancer.  I am absolutely surprised by this.  He underwent a upper endoscopy.  Dr. Bryan Lemma really did a fantastic job in making sure that he did get a upper endoscopy.  I think his last one was 5 years ago.  With the upper endoscopy, there was an area of erythema that looked a little suspicious in the stomach.  Keith [name] has had a past partial gastrectomy because of the small bowel cancer.  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  I spoke to the pathologist.  She feels that this is a primary gastric cancer.  He had a CT scan done back in late October.  The CT scan was totally negative.  He did go for a genetic testing.  I do not have the results back yet.  However, when he had his urothelial cancer, this was MSI high.  He feels well.  He has had no abdominal pain.  He is eating well.  His appetite is good.  He is running.  Overall, his performance status is ECOG 0.    Medications:  Allergies as of 08/07/2020   No Known Allergies     Medication List       Accurate as of August 07, 2020  3:27 PM. If you have any questions, ask your nurse or doctor.        STOP taking these medications   pantoprazole 40 MG  tablet Commonly known as: PROTONIX Stopped by: Volanda Napoleon, MD     TAKE these medications   carboxymethylcellulose 0.5 % Soln Commonly known as: REFRESH PLUS Place 1 drop into both eyes 3 (three) times daily as needed (dry/irritated eyes.).   co-enzyme Q-10 30 MG capsule Take 30 mg by mouth as needed. 08/07/2020 Takes 2 times/weekly.   lipase/protease/amylase 36000 UNITS Cpep capsule Commonly known as: Creon Take 1 capsule (36,000 Units total) by mouth 3 (three) times daily with meals. What changed:   when to take this  reasons to take this  additional instructions   sucralfate 1 GM/10ML suspension Commonly known as: CARAFATE Take 10 mLs (1 g total) by mouth 2 (two) times daily. Sucralfate suspension 1 gram twice daily for 4 weeks.   VITAMIN B-12 PO Take by mouth as needed.   VITAMIN D PO Take 1 tablet by mouth as needed.       Allergies: No Known Allergies  Past Medical History, Surgical history, Social history, and Family History were reviewed and updated.  Review of Systems: Review of Systems  Constitutional: Positive for weight loss.  HENT: Negative.   Eyes: Negative.   Respiratory: Negative.   Cardiovascular: Negative.   Gastrointestinal: Negative.   Genitourinary: Negative.   Musculoskeletal: Negative.   Skin: Negative.   Neurological: Negative.   Endo/Heme/Allergies: Negative.   Psychiatric/Behavioral: Negative.  Physical Exam:  weight is 122 lb 6.4 oz (55.5 kg). His oral temperature is 98 F (36.7 C). His blood pressure is 123/84 and his pulse is 56 (abnormal). His respiration is 20 and oxygen saturation is 100%.   Wt Readings from Last 3 Encounters:  08/07/20 122 lb 6.4 oz (55.5 kg)  07/23/20 118 lb (53.5 kg)  07/01/20 118 lb (53.5 kg)    Physical Exam Vitals reviewed.  HENT:     Head: Normocephalic and atraumatic.  Eyes:     Pupils: Pupils are equal, round, and reactive to light.  Cardiovascular:     Rate and Rhythm: Normal  rate and regular rhythm.     Heart sounds: Normal heart sounds.  Pulmonary:     Effort: Pulmonary effort is normal.     Breath sounds: Normal breath sounds.  Abdominal:     General: Bowel sounds are normal.     Palpations: Abdomen is soft.     Comments: Abdominal exam shows a soft abdomen.  He has well-healed laparoscopic scars from his ureteral surgery.  He has a well-healed laparotomy scar from the small bowel cancer.  He has good bowel sounds.  There is no fluid wave.  There is no palpable liver or spleen tip.  Musculoskeletal:        General: No tenderness or deformity. Normal range of motion.     Cervical back: Normal range of motion.  Lymphadenopathy:     Cervical: No cervical adenopathy.  Skin:    General: Skin is warm and dry.     Findings: No erythema or rash.  Neurological:     Mental Status: He is alert and oriented to person, place, and time.  Psychiatric:        Behavior: Behavior normal.        Thought Content: Thought content normal.        Judgment: Judgment normal.      Lab Results  Component Value Date   WBC 4.0 08/07/2020   HGB 12.4 (L) 08/07/2020   HCT 37.4 (L) 08/07/2020   MCV 91.4 08/07/2020   PLT 119 (L) 08/07/2020   Lab Results  Component Value Date   FERRITIN 85 09/18/2019   IRON 95 09/18/2019   TIBC 331 09/18/2019   UIBC 237 09/18/2019   IRONPCTSAT 29 09/18/2019   Lab Results  Component Value Date   RBC 4.09 (L) 08/07/2020   No results found for: KPAFRELGTCHN, LAMBDASER, KAPLAMBRATIO No results found for: IGGSERUM, IGA, IGMSERUM No results found for: Odetta Pink, SPEI   Chemistry      Component Value Date/Time   NA 141 08/07/2020 1343   NA 147 (H) 08/06/2017 0816   NA 144 02/04/2017 0758   K 4.6 08/07/2020 1343   K 4.6 08/06/2017 0816   K 4.6 02/04/2017 0758   CL 108 08/07/2020 1343   CL 104 08/06/2017 0816   CO2 27 08/07/2020 1343   CO2 30 08/06/2017 0816   CO2 25  02/04/2017 0758   BUN 15 08/07/2020 1343   BUN 12 08/06/2017 0816   BUN 14.2 02/04/2017 0758   CREATININE 1.66 (H) 08/07/2020 1343   CREATININE 0.9 08/06/2017 0816   CREATININE 0.9 02/04/2017 0758      Component Value Date/Time   CALCIUM 9.0 08/07/2020 1343   CALCIUM 9.0 08/06/2017 0816   CALCIUM 8.9 02/04/2017 0758   ALKPHOS 107 08/07/2020 1343   ALKPHOS 108 (H) 08/06/2017 0816   ALKPHOS 113  02/04/2017 0758   AST 17 08/07/2020 1343   AST 24 02/04/2017 0758   ALT 14 08/07/2020 1343   ALT 27 08/06/2017 0816   ALT 20 02/04/2017 0758   BILITOT 0.5 08/07/2020 1343   BILITOT 0.91 02/04/2017 0758       Impression and Plan: KeithGengis 49yoChinese gentleman with history oflocally advanced small bowel cancer. He underwent resectionand had9 positive lymph nodes putting him atrisk for recurrence. He underwent aggressive adjuvant therapy with both chemotherapy and radiation therapy completed inOctober 2015.  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 He hashigh MSI and high TMB levels. We tried him on adjuvant chemotherapy with cis-platinum/gemcitabine.  After 2 cycles, we stopped secondary to hepatotoxicity.  I am surprised by what appears to be a third cancer.  This appears to be a primary stomach cancer.  I would really like to see what his genetic studies show that he had done.  I would think that he might qualify for Lynch syndrome.  He has 2 children.  It might be important for them to be evaluated at some point for any genetic predisposition for malignancy.  We will set up a PET scan on him.  I have to believe that this can be a very early stage cancer.  Hopefully, it can be resected.  Hopefully he will not need adjuvant therapy.  We will speak with Dr. Jyl Heinz, the premier surgical oncologist at [facility].  I would think that this is something that he would be able to resect out.  I will have to await the PET scan results.  We will see with  Dr. Crisoforo Oxford has to say.  I would not make a follow-up appointment with [place] until we know what goes on with respect to any surgery.   Volanda Napoleon, MD 12/1/20213:27 PM

## 2020-08-08 ENCOUNTER — Telehealth: Payer: Self-pay | Admitting: Genetic Counselor

## 2020-08-08 ENCOUNTER — Encounter: Payer: Self-pay | Admitting: Genetic Counselor

## 2020-08-08 ENCOUNTER — Telehealth: Payer: Self-pay | Admitting: Hematology & Oncology

## 2020-08-08 DIAGNOSIS — Z1509 Genetic susceptibility to other malignant neoplasm: Secondary | ICD-10-CM | POA: Insufficient documentation

## 2020-08-08 LAB — CEA (IN HOUSE-CHCC): CEA (CHCC-In House): 6.13 ng/mL — ABNORMAL HIGH (ref 0.00–5.00)

## 2020-08-08 NOTE — Telephone Encounter (Signed)
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 This confirms the diagnosis of Lynch syndrome and explains why Keith [name] has had multiple cancers, as well as why there is cancer in the family. We have scheduled a follow-up appointment for Monday, 12/6 at 3:30 pm to review these results and recommendations for Keith [name] and his family members.

## 2020-08-08 NOTE — Telephone Encounter (Signed)
No los 12/1

## 2020-08-12 ENCOUNTER — Encounter: Payer: Self-pay | Admitting: Genetic Counselor

## 2020-08-12 ENCOUNTER — Other Ambulatory Visit: Payer: Self-pay

## 2020-08-12 ENCOUNTER — Inpatient Hospital Stay: Payer: Managed Care, Other (non HMO) | Admitting: Genetic Counselor

## 2020-08-12 DIAGNOSIS — Z1509 Genetic susceptibility to other malignant neoplasm: Secondary | ICD-10-CM

## 2020-08-12 DIAGNOSIS — Z1379 Encounter for other screening for genetic and chromosomal anomalies: Secondary | ICD-10-CM

## 2020-08-12 NOTE — Progress Notes (Signed)
REFERRING PROVIDER: Lavena Bullion, DO [address] [place],  [place] [address]  PRIMARY PROVIDER:  Martinique, Betty G, MD  PRIMARY REASON FOR VISIT:  1. MSH2-related Lynch syndrome (HNPCC1)   2. Genetic testing     GENETIC TEST RESULTS   Patient Name: Keith [name] Patient Age: 49 y.o. Encounter Date: 08/12/2020  HPI: Mr. Portal was previously seen in the [place] clinic due to a family of cancer and concerns regarding a hereditary predisposition to cancer. Please refer to our prior cancer genetics clinic note for more information regarding [name] medical, social and family histories, and our assessment and recommendations, at the time. [name] recent genetic test results were disclosed to him, as were recommendations warranted by these results. These results and recommendations are discussed in more detail below.   FAMILY HISTORY:  We obtained a detailed, 4-generation family history.  Significant diagnoses are listed below: Family History  Problem Relation Age of Onset  . Cancer Mother 55       Ureter  . Hypertension Other   . Colon cancer Sister 55  . Esophageal cancer Father 83  . Colon cancer Cousin 35       paternal first cousin  . Cancer Sister 62       Ureter  . Rectal cancer Neg Hx   . Stomach cancer Neg Hx    [name] has one son (age 18) and one daughter (age 77). He has two brothers and had two sisters. One sister died at the age of 40 from what he believes was colon cancer. His other sister was diagnosed with ureteral cancer at the age of 89.   [name] mother died at the age of 51 from ureteral cancer that was diagnosed at age 76. He had two maternal uncles. His maternal grandmother died in her 58s, and his maternal grandfather died in his 34s. There are no other known diagnoses of cancer on the maternal side of the family.  [name] father died at the age of 25 from esophageal cancer that was diagnosed at age 84. His  father was not a smoker. [name] had two paternal uncles. One paternal first cousin was diagnosed with colon cancer at the age of 44. His paternal grandmother died at age 30 and his paternal grandfather died at age 67. There are no other known diagnoses of cancer on the paternal side of the family.  [name] is unaware of previous family history of genetic testing for hereditary cancer risks. Patient's maternal and paternal ancestors are of [place] descent. There is no reported Ashkenazi Jewish ancestry. There is no known consanguinity.  GENETIC TESTING:  Genetic testing reported out on 08/06/2020 through the [facility] CustomNext-Cancer + RNAinsight gene panel. This genetic test detected a single, heterozygous pathogenic variant in the MSH2 gene called p.G751R (c.2251G>A). This is consistent with a diagnosis of Lynch syndrome.   The CustomNext-Cancer+RNAinsight panel offered by [facility] included sequencing and rearrangement analysis for the following 47 genes:  APC, ATM, AXIN2, BARD1, BMPR1A, BRCA1, BRCA2, BRIP1, CDH1, CDK4, CDKN2A, CHEK2, DICER1, EPCAM, GREM1, HOXB13, MEN1, MLH1, MSH2, MSH3, MSH6, MUTYH, NBN, NF1, NF2, NTHL1, PALB2, PMS2, POLD1, POLE, PTEN, RAD51C, RAD51D, RECQL, RET, SDHA, SDHAF2, SDHB, SDHC, SDHD, SMAD4, SMARCA4, STK11, TP53, TSC1, TSC2, and VHL.  RNA data is routinely analyzed for use in variant interpretation for all genes. The test report has been scanned into EPIC and located under the Molecular Pathology section of the  Results Review tab.  A portion of the result report is included below for reference.    Genetic testing also identified a variant of uncertain significance (VUS) in the MUTYH gene called p.S412L (c.1235C>T). At this time, it is unknown if this variant is associated with increased cancer risk or if this is a normal finding, but most variants such as this get reclassified to being inconsequential. It should not be used to make medical management decisions. With  time, we suspect the lab will determine the significance of this variant, if any. If we do learn more about it, we will try to contact [name] to discuss it further. However, it is important to stay in touch with [place] periodically and keep the address and phone number up to date.  CANCER RISKS & SCREENING RECOMMENDATIONS:  We discussed the implications of Lynch syndrome for [name] and discussed who else in the family should have genetic testing. Individuals with Lynch syndrome have an increased risk for colon cancer and endometrial cancer, as well as other types of cancers. The cancer risks associated with the MSH2 gene are currently known to include:  Colon cancer: 33-52%  Endometrial cancer: 21-57%  Ovarian cancer: 8-38%  Renal pelvis and/or ureter: 2.2-28%  Bladder: 4.4-12.8%  Gastric: 0.2-9.0%  Small bowel: 1.1-10%  Biliary tract: 0.02-1.7%  Prostate: 3.9-23.8%  Brain: 2.5-7.7%   Additional screening and risk-reduction options have been identified to be appropriate for individuals with Lynch syndrome to manage these risks. We recommended [name] consider following management guidelines for Lynch syndrome; all of which are outlined below. These can be coordinated by [name] oncologist or primary care provider.     Surveillance/prevention strategies outlined by the NCCN guidelines (v1.2021) for individuals (both men and women) with MSH2-related Lynch syndrome: 1. High-quality colonoscopy every 1-2 y, beginning at age 69-25 or 62-5 y prior to the earliest colon cancer if it is diagnosed before age 27 y. 2. Consider the use of daily of aspirin for at least 2 y to decrease CRC risk. The decision to use aspirin in Lynch syndrome should be made on an individualized basis including a discussion of dose, benefits, and adverse effects.  3. While there is no clear evidence to support screening for stomach and small bowel cancer, an upper endoscopy can be considered at 3-5 year intervals  beginning at age 48 y. However, whether to have this screening is best determined by a gastroenterologist.  4. There is no clear evidence to support screening for urothelial cancers in Lynch syndrome, although annual urinalysis starting at age 40-35 y may be considered for those with a family history of urothelial cancer. Individuals with MSH2 pathogenic variants (especially males) appear to be at higher risk. 5. Consider annual physical/neurologic examination starting at age 22-30 y to manage brain cancer risk - no additional screening recommendations have been made. 6. For individuals with a family history of pancreatic cancer, pancreatic cancer screening may be considered beginning at age 36 y (or 18 years younger than the earliest exocrine pancreatic cancer diagnosis in the family, whichever is earlier) when more than one first- or second-degree relatives from the same side of the family as the identified pathogenic/likely pathogenic variant have had exocrine pancreatic cancer.   For men with Lynch syndrome, it is reasonable to consider beginning shared decision-making about prostate cancer screening at age 57 years, and to consider screening at annual intervals rather than every other year.  For women with Lynch syndrome, unlike the effective surveillance  plan for colorectal cancer risk, there is no professional agreement regarding management for the increased risk of uterine and ovarian cancer. For endometrial cancer, women are encouraged to be aware of and immediately report dysfunctional or post-menopausal bleeding, which should then be followed up by an endometrial biopsy. In terms of surveillance, transvaginal ultrasound and endometrial biopsies have not been shown to be effective screening tools; however, they may be considered at the clinician's discretion. Importantly, transvaginal ultrasound is not recommended in pre-menopausal women due to variable presentations throughout a normal menstrual  cycle. Endometrial biopsy can be considered every 1-2 years, but does not have proven benefit of reducing mortality in women with Lynch syndrome given the typically early presenting symptoms. Finally, while hysterectomy has not been shown to reduce endometrial cancer mortality, it does reduce incidence, and therefore, can be considered as a risk-reducing option.   Unfortunately, symptoms of early stage ovarian cancer are not as obvious as endometrial cancer; however, women are encouraged to be aware of symptoms, such as pelvic or abdominal pain, bloating, increased abdominal girth, difficulty eating, feeling full from eating quickly, as well as increased urinary frequency and urgency. In terms of surveillance, transvaginal ultrasound examination and serum CA-125 have not been shown to be sufficiently sensitive or specific to support for routine screening. In terms of risk-reducing surgery, the decision to undergo a bilateral salpingo-oophorectomy (BSO) should be individualized. However, we are available to help women and their providers establish an individualized surveillance plan. It is also important for women to understand the following:  1. Women should seek medical attention if they experience abnormal vaginal bleeding. 2. Some providers may still recommend vaginal ultrasounds, uterine biopsies (for uterine cancer risk) and/or CA-125 analysis (for ovarian cancer risk), even though these have not been shown to be effective. 3. A hysterectomy with removal of the ovaries and fallopian tubes could be considered once childbearing is completed (if planned).  FAMILY MEMBERS: Since we now know the mutation in Mr. Piech, we can test at-risk relatives to determine whether or not they have inherited the mutation and are at increased risk for cancer. It is important that all of Mr. Bhagat relatives (both men and women) know of the presence of this gene mutation. Site-specific genetic testing can sort out who in the  family is at risk and who is not. We will be happy to meet with any of the family members or refer them to a genetic counselor in their local area. To locate genetic counselors in other cities, individuals can visit the website "[email or web address]" and search for a cancer genetic counselor by zip code.   Mr. Steel children are have a 50% chance to have inherited this mutation. However, they are relatively young and this will not be of any consequence to them for several years. We do not test children because there is no risk to them until they are adults. We recommend they have genetic counseling and testing by the time they are in their early 20s.    We also recommend that Mr. Lefkowitz siblings, maternal relatives, and paternal relatives have genetic testing for this same mutation, as identifying the presence of this mutation would allow them to also take advantage of risk-reducing measures.   Children who inherit two mutations in the same Lynch gene, one mutation from each parent, are at-risk of a rare recessive condition called constitutional mismatch repair deficiency (CMMR-D) syndrome. If family members have this mutation, they may wish to have their partner tested if they are planning  on having children.  PLAN: Mr. Godsey will need to be followed as high risk based on his diagnosis of Lynch syndrome. He will plan to follow up with his oncologist, Dr. Marin Olp, and his gastroenterologist, Dr. Bryan Lemma, for his diagnosis of Lynch syndrome.  RESOURCES:  If Mr. Fontanez is interested in Lynch syndrome-specific information and support, there are two groups, Lynch Syndrome International (www.lynchcancers.com) and [employer]ick[employer] (www.aliveandkickn.org) which some people have found useful. They provide opportunities to speak with other individuals with Lynch syndrome.  We strongly encouraged Mr. Virginia to remain in contact with [place] in cancer genetics on an annual basis so we can update Mr. Quaintance personal  and family histories, and inform him of advances in cancer genetics that may be of benefit for the entire family. Mr. Hazard knows he is also welcome to call with any questions or concerns, at any time.    Clint Guy, Sierra Brooks, [facility] Licensed, Certified Dispensing [email or web address] Phone: [phone]  The patient was seen for a total of 35 minutes in face-to-face genetic counseling.

## 2020-08-19 ENCOUNTER — Ambulatory Visit (HOSPITAL_COMMUNITY)
Admission: RE | Admit: 2020-08-19 | Discharge: 2020-08-19 | Disposition: A | Discharge status: Home / Self Care | Payer: Managed Care, Other (non HMO) | Source: Ambulatory Visit | Attending: Hematology & Oncology | Admitting: Hematology & Oncology

## 2020-08-19 ENCOUNTER — Other Ambulatory Visit: Payer: Self-pay

## 2020-08-19 DIAGNOSIS — C162 Malignant neoplasm of body of stomach: Secondary | ICD-10-CM | POA: Insufficient documentation

## 2020-08-19 LAB — GLUCOSE, CAPILLARY: Glucose-Capillary: 97 mg/dL (ref 70–99)

## 2020-08-19 MED ORDER — FLUDEOXYGLUCOSE F - 18 (FDG) INJECTION
6.1000 | Freq: Once | INTRAVENOUS | Status: AC | PRN
  Administered 2020-08-19: 13:00:00 6.1 via INTRAVENOUS

## 2020-08-20 ENCOUNTER — Encounter: Payer: Self-pay | Admitting: *Deleted

## 2020-08-20 DIAGNOSIS — C169 Malignant neoplasm of stomach, unspecified: Secondary | ICD-10-CM | POA: Insufficient documentation

## 2020-09-04 ENCOUNTER — Ambulatory Visit: Payer: Managed Care, Other (non HMO) | Admitting: Hematology & Oncology

## 2020-09-04 ENCOUNTER — Ambulatory Visit (HOSPITAL_BASED_OUTPATIENT_CLINIC_OR_DEPARTMENT_OTHER): Payer: Managed Care, Other (non HMO)

## 2020-09-04 ENCOUNTER — Other Ambulatory Visit: Payer: Managed Care, Other (non HMO)

## 2020-10-12 ENCOUNTER — Encounter: Payer: Self-pay | Admitting: Hematology & Oncology

## 2020-10-14 ENCOUNTER — Telehealth: Payer: Self-pay

## 2020-10-14 NOTE — Telephone Encounter (Signed)
Called and left a vm with a f/u appt per 10/14/20 inbasket message    Cynthie Garmon

## 2020-10-24 ENCOUNTER — Other Ambulatory Visit: Payer: Self-pay | Admitting: *Deleted

## 2020-10-24 ENCOUNTER — Telehealth: Payer: Self-pay | Admitting: Hematology & Oncology

## 2020-10-24 DIAGNOSIS — C689 Malignant neoplasm of urinary organ, unspecified: Secondary | ICD-10-CM

## 2020-10-24 DIAGNOSIS — C178 Malignant neoplasm of overlapping sites of small intestine: Secondary | ICD-10-CM

## 2020-10-24 NOTE — Telephone Encounter (Signed)
Called and spoke with patient regarding his appointments for 2/21 being rescheduled .  He was ok with new date & time

## 2020-10-25 ENCOUNTER — Telehealth: Payer: Self-pay | Admitting: *Deleted

## 2020-10-25 ENCOUNTER — Inpatient Hospital Stay (HOSPITAL_BASED_OUTPATIENT_CLINIC_OR_DEPARTMENT_OTHER): Payer: Managed Care, Other (non HMO) | Admitting: Hematology & Oncology

## 2020-10-25 ENCOUNTER — Encounter: Payer: Self-pay | Admitting: Hematology & Oncology

## 2020-10-25 ENCOUNTER — Inpatient Hospital Stay: Payer: Managed Care, Other (non HMO) | Attending: Hematology & Oncology

## 2020-10-25 ENCOUNTER — Other Ambulatory Visit: Payer: Self-pay

## 2020-10-25 DIAGNOSIS — C662 Malignant neoplasm of left ureter: Secondary | ICD-10-CM | POA: Diagnosis not present

## 2020-10-25 DIAGNOSIS — Z7901 Long term (current) use of anticoagulants: Secondary | ICD-10-CM | POA: Diagnosis not present

## 2020-10-25 DIAGNOSIS — C162 Malignant neoplasm of body of stomach: Secondary | ICD-10-CM | POA: Diagnosis not present

## 2020-10-25 DIAGNOSIS — Z85068 Personal history of other malignant neoplasm of small intestine: Secondary | ICD-10-CM | POA: Diagnosis not present

## 2020-10-25 DIAGNOSIS — C689 Malignant neoplasm of urinary organ, unspecified: Secondary | ICD-10-CM

## 2020-10-25 DIAGNOSIS — I82712 Chronic embolism and thrombosis of superficial veins of left upper extremity: Secondary | ICD-10-CM | POA: Diagnosis not present

## 2020-10-25 DIAGNOSIS — C178 Malignant neoplasm of overlapping sites of small intestine: Secondary | ICD-10-CM

## 2020-10-25 DIAGNOSIS — C169 Malignant neoplasm of stomach, unspecified: Secondary | ICD-10-CM | POA: Diagnosis not present

## 2020-10-25 HISTORY — DX: Malignant neoplasm of body of stomach: C16.2

## 2020-10-25 LAB — CMP (CANCER CENTER ONLY)
ALT: 31 U/L (ref 0–44)
AST: 31 U/L (ref 15–41)
Albumin: 4 g/dL (ref 3.5–5.0)
Alkaline Phosphatase: 157 U/L — ABNORMAL HIGH (ref 38–126)
Anion gap: 7 (ref 5–15)
BUN: 29 mg/dL — ABNORMAL HIGH (ref 6–20)
CO2: 23 mmol/L (ref 22–32)
Calcium: 9.5 mg/dL (ref 8.9–10.3)
Chloride: 108 mmol/L (ref 98–111)
Creatinine: 1.78 mg/dL — ABNORMAL HIGH (ref 0.61–1.24)
GFR, Estimated: 46 mL/min — ABNORMAL LOW (ref >60)
Glucose, Bld: 198 mg/dL — ABNORMAL HIGH (ref 70–99)
Potassium: 4.6 mmol/L (ref 3.5–5.1)
Sodium: 138 mmol/L (ref 135–145)
Total Bilirubin: 0.9 mg/dL (ref 0.3–1.2)
Total Protein: 7 g/dL (ref 6.5–8.1)

## 2020-10-25 LAB — CBC WITH DIFFERENTIAL (CANCER CENTER ONLY)
Abs Immature Granulocytes: 0.01 10*3/uL (ref 0.00–0.07)
Basophils Absolute: 0 10*3/uL (ref 0.0–0.1)
Basophils Relative: 1 %
Eosinophils Absolute: 0.3 10*3/uL (ref 0.0–0.5)
Eosinophils Relative: 6 %
HCT: 38 % — ABNORMAL LOW (ref 39.0–52.0)
Hemoglobin: 12.5 g/dL — ABNORMAL LOW (ref 13.0–17.0)
Immature Granulocytes: 0 %
Lymphocytes Relative: 23 %
Lymphs Abs: 1.1 10*3/uL (ref 0.7–4.0)
MCH: 28.9 pg (ref 26.0–34.0)
MCHC: 32.9 g/dL (ref 30.0–36.0)
MCV: 88 fL (ref 80.0–100.0)
Monocytes Absolute: 0.3 10*3/uL (ref 0.1–1.0)
Monocytes Relative: 5 %
Neutro Abs: 3.3 10*3/uL (ref 1.7–7.7)
Neutrophils Relative %: 65 %
Platelet Count: 167 10*3/uL (ref 150–400)
RBC: 4.32 MIL/uL (ref 4.22–5.81)
RDW: 13.8 % (ref 11.5–15.5)
WBC Count: 5 10*3/uL (ref 4.0–10.5)
nRBC: 0 % (ref 0.0–0.2)

## 2020-10-25 LAB — LACTATE DEHYDROGENASE: LDH: 150 U/L (ref 98–192)

## 2020-10-25 NOTE — Telephone Encounter (Signed)
Per los 10/25/20 - called patient of upcoming apts. - Clinical biochemist - mailed calendar

## 2020-10-25 NOTE — Progress Notes (Signed)
Hematology and Oncology Follow Up Visit  Keith [name] [id number] 09/24/1970 50 y.o. 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  He comes in with his wife.  He did ultimately undergo a resection of this adenocarcinoma of the stomach.  This was done at [facility] by the wonderful Dr. Crisoforo Oxford.  This was done on 08/28/2020.  He had a distal gastrectomy.  He unfortunately also needed a temporary diverting colostomy.  The pathology report showed a 1 cm poorly differentiated adenocarcinoma with signet ring features.  It invaded into the muscularis propria.  Margins were negative.  4 lymph nodes were all negative.  As such, he would be staged as a stage Ib (T2 N0 M0) adenocarcinoma.   At this point, I really do not think we have to do any type of adjuvant therapy.  He is already had a lot of adjuvant therapy for his other malignancies.  He has had FOLFOX.  He had radiation therapy.  He has had Xeloda.  I think we can just watch this.  He does have the Lynch syndrome.  However, I do not see that we have a role for immunotherapy in this situation.  He is goes back to see Dr. Crisoforo Oxford in April.  A CT scan will be done at that time.  He has lost quite a bit of weight.  His weight is 105 pounds.  He has a colostomy which hopefully will be reversed.  There has been no bleeding.  He has had no fever.  Overall, his performance status right now is ECOG  1.    Medications:  Allergies as of 10/25/2020   No Known Allergies     Medication List       Accurate as of October 25, 2020  1:39 PM. If you have any questions, ask your nurse or doctor.        carboxymethylcellulose 0.5 % Soln Commonly known as: REFRESH PLUS Place 1 drop into both eyes 3 (three) times daily as needed (dry/irritated eyes.).   co-enzyme Q-10 30 MG capsule Take 30 mg by mouth as needed. 08/07/2020 Takes 2 times/weekly.   lipase/protease/amylase 36000 UNITS Cpep capsule Commonly known as: Creon Take 1 capsule (36,000 Units total) by mouth 3 (three) times daily with meals. What changed:   when to take this  reasons to take this  additional instructions   sucralfate 1 GM/10ML suspension Commonly known as: CARAFATE Take 10 mLs (1 g total) by mouth 2 (two) times daily. Sucralfate suspension 1 gram twice daily for 4 weeks.   VITAMIN B-12 PO Take by mouth as needed.   VITAMIN D PO Take 1 tablet by mouth as needed.       Allergies: No Known Allergies  Past Medical History, Surgical history, Social history, and Family History were reviewed and updated.  Review of Systems: Review of Systems  Constitutional: Positive for weight loss.  HENT: Negative.   Eyes: Negative.   Respiratory: Negative.   Cardiovascular: Negative.  Gastrointestinal: Negative.   Genitourinary: Negative.   Musculoskeletal: Negative.   Skin: Negative.   Neurological: Negative.   Endo/Heme/Allergies: Negative.   Psychiatric/Behavioral: Negative.      Physical Exam:  weight is 105 lb (47.6 kg). His oral temperature is 97.9 F (36.6 C). His blood pressure is 91/60 and his pulse is 75. His respiration is 18 and oxygen saturation is 100%.   Wt Readings from Last 3 Encounters:  10/25/20 105 lb (47.6 kg)  08/07/20 122 lb 6.4 oz (55.5 kg)  07/23/20 118 lb (53.5 kg)    Physical Exam Vitals reviewed.  HENT:     Head: Normocephalic and atraumatic.  Eyes:     Pupils:  Pupils are equal, round, and reactive to light.  Cardiovascular:     Rate and Rhythm: Normal rate and regular rhythm.     Heart sounds: Normal heart sounds.  Pulmonary:     Effort: Pulmonary effort is normal.     Breath sounds: Normal breath sounds.  Abdominal:     General: Bowel sounds are normal.     Palpations: Abdomen is soft.     Comments: Abdominal exam shows a soft abdomen.  He has well-healed laparoscopic scars from his ureteral surgery.  He has a well-healed laparotomy scar from the small bowel cancer.  He does have ostomy bag in the right lower quadrant.  He has good bowel sounds.  There is no fluid wave.  There is no palpable liver or spleen tip.  Musculoskeletal:        General: No tenderness or deformity. Normal range of motion.     Cervical back: Normal range of motion.  Lymphadenopathy:     Cervical: No cervical adenopathy.  Skin:    General: Skin is warm and dry.     Findings: No erythema or rash.  Neurological:     Mental Status: He is alert and oriented to person, place, and time.  Psychiatric:        Behavior: Behavior normal.        Thought Content: Thought content normal.        Judgment: Judgment normal.    Lab Results  Component Value Date   WBC 5.0 10/25/2020   HGB 12.5 (L) 10/25/2020   HCT 38.0 (L) 10/25/2020   MCV 88.0 10/25/2020   PLT 167 10/25/2020   Lab Results  Component Value Date   FERRITIN 85 09/18/2019   IRON 95 09/18/2019   TIBC 331 09/18/2019   UIBC 237 09/18/2019   IRONPCTSAT 29 09/18/2019   Lab Results  Component Value Date   RBC 4.32 10/25/2020   No results found for: KPAFRELGTCHN, LAMBDASER, KAPLAMBRATIO No results found for: IGGSERUM, IGA, IGMSERUM No results found for: Odetta Pink, SPEI   Chemistry      Component Value Date/Time   NA 138 10/25/2020 1201   NA 147 (H) 08/06/2017 0816   NA 144 02/04/2017 0758   K 4.6 10/25/2020 1201   K 4.6 08/06/2017 0816   K 4.6  02/04/2017 0758   CL 108 10/25/2020 1201   CL 104 08/06/2017 0816   CO2 23 10/25/2020 1201   CO2 30 08/06/2017 0816   CO2 25 02/04/2017 0758   BUN 29 (H) 10/25/2020 1201   BUN 12 08/06/2017 0816   BUN 14.2 02/04/2017 0758   CREATININE 1.78 (H) 10/25/2020 1201   CREATININE 0.9 08/06/2017 0816   CREATININE 0.9 02/04/2017 0758      Component Value  Date/Time   CALCIUM 9.5 10/25/2020 1201   CALCIUM 9.0 08/06/2017 0816   CALCIUM 8.9 02/04/2017 0758   ALKPHOS 157 (H) 10/25/2020 1201   ALKPHOS 108 (H) 08/06/2017 0816   ALKPHOS 113 02/04/2017 0758   AST 31 10/25/2020 1201   AST 24 02/04/2017 0758   ALT 31 10/25/2020 1201   ALT 27 08/06/2017 0816   ALT 20 02/04/2017 0758   BILITOT 0.9 10/25/2020 1201   BILITOT 0.91 02/04/2017 0758       Impression and Plan: Keith [name] 49yoChinese gentleman with history oflocally advanced small bowel cancer. He underwent resectionand had9 positive lymph nodes putting him atrisk for recurrence. He underwent aggressive adjuvant therapy with both chemotherapy and radiation therapy completed inOctober 2015.  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 He hashigh MSI and high TMB levels. We tried him on adjuvant chemotherapy with cis-platinum/gemcitabine.  After 2 cycles, we stopped secondary to hepatotoxicity.  I really should not be too surprised that he now is a third cancer.  He does have the Lynch syndrome.  Thankfully, we do not have to give any adjuvant therapy for this third malignancy.  It is only stage I.  I really do not see that adjuvant therapy is going to benefit him.  He will be followed closely by Dr. Crisoforo Oxford right now.  I am glad about this.  I will plan to see him back in 3 months.  Hopefully, he will have gained little bit of weight.    Volanda Napoleon, MD 2/18/20221:39 PM

## 2020-10-28 ENCOUNTER — Ambulatory Visit: Payer: Managed Care, Other (non HMO) | Admitting: Hematology & Oncology

## 2020-10-28 ENCOUNTER — Other Ambulatory Visit: Payer: Managed Care, Other (non HMO)

## 2020-10-28 LAB — CEA (IN HOUSE-CHCC): CEA (CHCC-In House): 3.24 ng/mL (ref 0.00–5.00)

## 2021-01-15 ENCOUNTER — Inpatient Hospital Stay: Payer: Managed Care, Other (non HMO) | Attending: Hematology & Oncology

## 2021-01-15 ENCOUNTER — Inpatient Hospital Stay (HOSPITAL_BASED_OUTPATIENT_CLINIC_OR_DEPARTMENT_OTHER): Payer: Managed Care, Other (non HMO) | Admitting: Hematology & Oncology

## 2021-01-15 ENCOUNTER — Other Ambulatory Visit: Payer: Self-pay

## 2021-01-15 ENCOUNTER — Telehealth: Payer: Self-pay

## 2021-01-15 VITALS — BP 116/79 | HR 70 | Temp 98.1°F | Resp 16 | Wt 109.0 lb

## 2021-01-15 DIAGNOSIS — Z7901 Long term (current) use of anticoagulants: Secondary | ICD-10-CM | POA: Diagnosis not present

## 2021-01-15 DIAGNOSIS — C662 Malignant neoplasm of left ureter: Secondary | ICD-10-CM | POA: Diagnosis present

## 2021-01-15 DIAGNOSIS — Z923 Personal history of irradiation: Secondary | ICD-10-CM | POA: Insufficient documentation

## 2021-01-15 DIAGNOSIS — C162 Malignant neoplasm of body of stomach: Secondary | ICD-10-CM

## 2021-01-15 DIAGNOSIS — C169 Malignant neoplasm of stomach, unspecified: Secondary | ICD-10-CM | POA: Diagnosis not present

## 2021-01-15 DIAGNOSIS — Z9221 Personal history of antineoplastic chemotherapy: Secondary | ICD-10-CM | POA: Diagnosis not present

## 2021-01-15 DIAGNOSIS — I82712 Chronic embolism and thrombosis of superficial veins of left upper extremity: Secondary | ICD-10-CM | POA: Insufficient documentation

## 2021-01-15 DIAGNOSIS — C178 Malignant neoplasm of overlapping sites of small intestine: Secondary | ICD-10-CM | POA: Diagnosis not present

## 2021-01-15 DIAGNOSIS — Z1509 Genetic susceptibility to other malignant neoplasm: Secondary | ICD-10-CM | POA: Diagnosis not present

## 2021-01-15 DIAGNOSIS — Z85068 Personal history of other malignant neoplasm of small intestine: Secondary | ICD-10-CM | POA: Insufficient documentation

## 2021-01-15 LAB — CMP (CANCER CENTER ONLY)
ALT: 28 U/L (ref 0–44)
AST: 26 U/L (ref 15–41)
Albumin: 3.9 g/dL (ref 3.5–5.0)
Alkaline Phosphatase: 131 U/L — ABNORMAL HIGH (ref 38–126)
Anion gap: 9 (ref 5–15)
BUN: 29 mg/dL — ABNORMAL HIGH (ref 6–20)
CO2: 27 mmol/L (ref 22–32)
Calcium: 9.6 mg/dL (ref 8.9–10.3)
Chloride: 102 mmol/L (ref 98–111)
Creatinine: 1.82 mg/dL — ABNORMAL HIGH (ref 0.61–1.24)
GFR, Estimated: 44 mL/min — ABNORMAL LOW (ref >60)
Glucose, Bld: 102 mg/dL — ABNORMAL HIGH (ref 70–99)
Potassium: 4.8 mmol/L (ref 3.5–5.1)
Sodium: 138 mmol/L (ref 135–145)
Total Bilirubin: 0.4 mg/dL (ref 0.3–1.2)
Total Protein: 7.1 g/dL (ref 6.5–8.1)

## 2021-01-15 LAB — CBC WITH DIFFERENTIAL (CANCER CENTER ONLY)
Abs Immature Granulocytes: 0.01 10*3/uL (ref 0.00–0.07)
Basophils Absolute: 0 10*3/uL (ref 0.0–0.1)
Basophils Relative: 1 %
Eosinophils Absolute: 0.2 10*3/uL (ref 0.0–0.5)
Eosinophils Relative: 4 %
HCT: 35.1 % — ABNORMAL LOW (ref 39.0–52.0)
Hemoglobin: 10.9 g/dL — ABNORMAL LOW (ref 13.0–17.0)
Immature Granulocytes: 0 %
Lymphocytes Relative: 27 %
Lymphs Abs: 1.4 10*3/uL (ref 0.7–4.0)
MCH: 27.6 pg (ref 26.0–34.0)
MCHC: 31.1 g/dL (ref 30.0–36.0)
MCV: 88.9 fL (ref 80.0–100.0)
Monocytes Absolute: 0.3 10*3/uL (ref 0.1–1.0)
Monocytes Relative: 6 %
Neutro Abs: 3.1 10*3/uL (ref 1.7–7.7)
Neutrophils Relative %: 62 %
Platelet Count: 276 10*3/uL (ref 150–400)
RBC: 3.95 MIL/uL — ABNORMAL LOW (ref 4.22–5.81)
RDW: 13.9 % (ref 11.5–15.5)
WBC Count: 5 10*3/uL (ref 4.0–10.5)
nRBC: 0 % (ref 0.0–0.2)

## 2021-01-15 LAB — LACTATE DEHYDROGENASE: LDH: 168 U/L (ref 98–192)

## 2021-01-15 NOTE — Progress Notes (Signed)
Hematology and Oncology Follow Up Visit  Keith [name] [id number] [birth year] 50 y.o. 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  He looks quite good.  He actually had surgery couple weeks ago at [facility].  Dr. Crisoforo Oxford went ahead and reverse his ileostomy.  This went without any problems.  He says that he had a CT scan done before the surgery.  So far, he has had no problems with his respective recovery from the stomach surgery.  He has stage I stomach cancer.  There is no indication for any adjuvant therapy for this.  He has had no issues with nausea or vomiting.  He has had no change in bowel or bladder habits.  He has had no bleeding.  He has had no cough or fever.  Currently, his performance status is ECOG 1.    Medications:  Allergies as of 01/15/2021   No Known Allergies     Medication List       Accurate as of Jan 15, 2021 12:45 PM. If you have any questions, ask your nurse or doctor.        STOP taking these medications   sucralfate 1 GM/10ML suspension Commonly known as: CARAFATE Stopped by: Volanda Napoleon, MD   VITAMIN D PO Stopped by: Volanda Napoleon, MD     TAKE these medications   acetaminophen 325 MG tablet Commonly known as: TYLENOL Take by mouth.   carboxymethylcellulose 0.5 % Soln Commonly known as: REFRESH PLUS Place 1 drop into both eyes 3 (three) times daily as needed (dry/irritated eyes.).    co-enzyme Q-10 30 MG capsule Take 30 mg by mouth as needed. 08/07/2020 Takes 2 times/weekly.   enoxaparin 40 MG/0.4ML injection Commonly known as: LOVENOX Inject into the skin.   lipase/protease/amylase 36000 UNITS Cpep capsule Commonly known as: Creon Take 1 capsule (36,000 Units total) by mouth 3 (three) times daily with meals. What changed:   when to take this  reasons to take this  additional instructions   oxyCODONE 5 MG immediate release tablet Commonly known as: Oxy IR/ROXICODONE Take by mouth.   sulfamethoxazole-trimethoprim 800-160 MG tablet Commonly known as: BACTRIM DS Take 1 tablet by mouth 2 (two) times daily.   VITAMIN B-12 PO Take by mouth as needed.   Vitamin D (Ergocalciferol) 1.25 MG (50000 UNIT) Caps capsule Commonly known as: DRISDOL Take 1 tablet by mouth daily.       Allergies: No Known Allergies  Past Medical History, Surgical history, Social history, and Family History were reviewed and updated.  Review of Systems: Review of Systems  Constitutional: Positive for weight loss.  HENT: Negative.   Eyes: Negative.   Respiratory: Negative.   Cardiovascular: Negative.   Gastrointestinal: Negative.   Genitourinary: Negative.   Musculoskeletal: Negative.   Skin: Negative.   Neurological: Negative.   Endo/Heme/Allergies: Negative.   Psychiatric/Behavioral: Negative.      Physical Exam:  weight is 109 lb (49.4 kg). His oral temperature is 98.1 F (36.7  C). His blood pressure is 116/79 and his pulse is 70. His respiration is 16 and oxygen saturation is 100%.   Wt Readings from Last 3 Encounters:  01/15/21 109 lb (49.4 kg)  10/25/20 105 lb (47.6 kg)  08/07/20 122 lb 6.4 oz (55.5 kg)    Physical Exam Vitals reviewed.  HENT:     Head: Normocephalic and atraumatic.  Eyes:     Pupils: Pupils are equal, round, and reactive to light.  Cardiovascular:     Rate and Rhythm: Normal rate and regular rhythm.     Heart sounds: Normal heart  sounds.  Pulmonary:     Effort: Pulmonary effort is normal.     Breath sounds: Normal breath sounds.  Abdominal:     General: Bowel sounds are normal.     Palpations: Abdomen is soft.     Comments: Abdominal exam shows a soft abdomen.  He has dressing over the ileostomy site.  His laparotomy scar in the midline is well-healed.  He has no fluid wave.  There is no guarding or rebound tenderness.  There is no palpable liver or spleen tip.    Musculoskeletal:        General: No tenderness or deformity. Normal range of motion.     Cervical back: Normal range of motion.  Lymphadenopathy:     Cervical: No cervical adenopathy.  Skin:    General: Skin is warm and dry.     Findings: No erythema or rash.  Neurological:     Mental Status: He is alert and oriented to person, place, and time.  Psychiatric:        Behavior: Behavior normal.        Thought Content: Thought content normal.        Judgment: Judgment normal.    Lab Results  Component Value Date   WBC 5.0 01/15/2021   HGB 10.9 (L) 01/15/2021   HCT 35.1 (L) 01/15/2021   MCV 88.9 01/15/2021   PLT 276 01/15/2021   Lab Results  Component Value Date   FERRITIN 85 09/18/2019   IRON 95 09/18/2019   TIBC 331 09/18/2019   UIBC 237 09/18/2019   IRONPCTSAT 29 09/18/2019   Lab Results  Component Value Date   RBC 3.95 (L) 01/15/2021   No results found for: KPAFRELGTCHN, LAMBDASER, KAPLAMBRATIO No results found for: IGGSERUM, IGA, IGMSERUM No results found for: Kathrynn Ducking, MSPIKE, SPEI   Chemistry      Component Value Date/Time   NA 138 01/15/2021 1146   NA 147 (H) 08/06/2017 0816   NA 144 02/04/2017 0758   K 4.8 01/15/2021 1146   K 4.6 08/06/2017 0816   K 4.6 02/04/2017 0758   CL 102 01/15/2021 1146   CL 104 08/06/2017 0816   CO2 27 01/15/2021 1146   CO2 30 08/06/2017 0816   CO2 25 02/04/2017 0758   BUN 29 (H) 01/15/2021 1146   BUN 12 08/06/2017 0816   BUN 14.2 02/04/2017  0758   CREATININE 1.82 (H) 01/15/2021 1146   CREATININE 0.9 08/06/2017 0816   CREATININE 0.9 02/04/2017 0758      Component Value Date/Time   CALCIUM 9.6 01/15/2021 1146   CALCIUM 9.0 08/06/2017 0816   CALCIUM 8.9 02/04/2017 0758   ALKPHOS 131 (H) 01/15/2021 1146   ALKPHOS 108 (H) 08/06/2017 0816   ALKPHOS 113 02/04/2017 0758   AST 26 01/15/2021 1146   AST 24 02/04/2017 0758   ALT 28 01/15/2021 1146  ALT 27 08/06/2017 0816   ALT 20 02/04/2017 0758   BILITOT 0.4 01/15/2021 1146   BILITOT 0.91 02/04/2017 0758       Impression and Plan: Keith [name] 82yoChinese gentleman with history oflocally advanced small bowel cancer. He underwent resectionand had9 positive lymph nodes putting him atrisk for recurrence. He underwent aggressive adjuvant therapy with both chemotherapy and radiation therapy completed inOctober 2015.  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 He hashigh MSI and high TMB levels. We tried him on adjuvant chemotherapy with cis-platinum/gemcitabine.  After 2 cycles, we stopped secondary to hepatotoxicity.  He was then found to have a third malignancy.  This was a stage I adenocarcinoma of the stomach.  This was removed on 08/28/2020.  He has the Lynch syndrome.  This certainly needs to be a prime consideration for any other malignancies.  It sounds like he will have his CAT scans done at [facility].  He will see Dr. Vernard Gambles later on in May.  I really think that we can get her through the summertime.  We will plan to see him back after Labor Day.  It sounds like he may have to go to [place] with his family and drive [place]  I told him to make sure that he watches out for any moose that might be on the road.   Volanda Napoleon, MD 5/11/202212:45 PM

## 2021-01-15 NOTE — Telephone Encounter (Signed)
appts made and printed for pt per 01/15/21 los   [employer]

## 2021-05-22 ENCOUNTER — Telehealth: Payer: Self-pay | Admitting: *Deleted

## 2021-05-22 ENCOUNTER — Inpatient Hospital Stay (HOSPITAL_BASED_OUTPATIENT_CLINIC_OR_DEPARTMENT_OTHER): Payer: Managed Care, Other (non HMO) | Admitting: Hematology & Oncology

## 2021-05-22 ENCOUNTER — Other Ambulatory Visit: Payer: Self-pay

## 2021-05-22 ENCOUNTER — Inpatient Hospital Stay: Payer: Managed Care, Other (non HMO) | Attending: Hematology & Oncology

## 2021-05-22 ENCOUNTER — Encounter: Payer: Self-pay | Admitting: Hematology & Oncology

## 2021-05-22 VITALS — BP 109/81 | HR 62 | Temp 98.6°F | Resp 17 | Wt 112.0 lb

## 2021-05-22 DIAGNOSIS — Z86718 Personal history of other venous thrombosis and embolism: Secondary | ICD-10-CM | POA: Diagnosis not present

## 2021-05-22 DIAGNOSIS — C662 Malignant neoplasm of left ureter: Secondary | ICD-10-CM | POA: Diagnosis not present

## 2021-05-22 DIAGNOSIS — R634 Abnormal weight loss: Secondary | ICD-10-CM | POA: Insufficient documentation

## 2021-05-22 DIAGNOSIS — C689 Malignant neoplasm of urinary organ, unspecified: Secondary | ICD-10-CM | POA: Diagnosis not present

## 2021-05-22 DIAGNOSIS — C178 Malignant neoplasm of overlapping sites of small intestine: Secondary | ICD-10-CM | POA: Diagnosis not present

## 2021-05-22 DIAGNOSIS — Z85068 Personal history of other malignant neoplasm of small intestine: Secondary | ICD-10-CM | POA: Diagnosis not present

## 2021-05-22 DIAGNOSIS — Z9221 Personal history of antineoplastic chemotherapy: Secondary | ICD-10-CM | POA: Diagnosis not present

## 2021-05-22 DIAGNOSIS — Z923 Personal history of irradiation: Secondary | ICD-10-CM | POA: Insufficient documentation

## 2021-05-22 DIAGNOSIS — C162 Malignant neoplasm of body of stomach: Secondary | ICD-10-CM

## 2021-05-22 DIAGNOSIS — Z1509 Genetic susceptibility to other malignant neoplasm: Secondary | ICD-10-CM | POA: Diagnosis not present

## 2021-05-22 DIAGNOSIS — C169 Malignant neoplasm of stomach, unspecified: Secondary | ICD-10-CM | POA: Diagnosis not present

## 2021-05-22 LAB — CBC WITH DIFFERENTIAL (CANCER CENTER ONLY)
Abs Immature Granulocytes: 0.03 10*3/uL (ref 0.00–0.07)
Basophils Absolute: 0 10*3/uL (ref 0.0–0.1)
Basophils Relative: 1 %
Eosinophils Absolute: 0.3 10*3/uL (ref 0.0–0.5)
Eosinophils Relative: 7 %
HCT: 33.8 % — ABNORMAL LOW (ref 39.0–52.0)
Hemoglobin: 11.3 g/dL — ABNORMAL LOW (ref 13.0–17.0)
Immature Granulocytes: 1 %
Lymphocytes Relative: 26 %
Lymphs Abs: 1 10*3/uL (ref 0.7–4.0)
MCH: 28.8 pg (ref 26.0–34.0)
MCHC: 33.4 g/dL (ref 30.0–36.0)
MCV: 86.2 fL (ref 80.0–100.0)
Monocytes Absolute: 0.3 10*3/uL (ref 0.1–1.0)
Monocytes Relative: 7 %
Neutro Abs: 2.3 10*3/uL (ref 1.7–7.7)
Neutrophils Relative %: 58 %
Platelet Count: 149 10*3/uL — ABNORMAL LOW (ref 150–400)
RBC: 3.92 MIL/uL — ABNORMAL LOW (ref 4.22–5.81)
RDW: 14.2 % (ref 11.5–15.5)
WBC Count: 4 10*3/uL (ref 4.0–10.5)
nRBC: 0 % (ref 0.0–0.2)

## 2021-05-22 LAB — CMP (CANCER CENTER ONLY)
ALT: 14 U/L (ref 0–44)
AST: 17 U/L (ref 15–41)
Albumin: 3.9 g/dL (ref 3.5–5.0)
Alkaline Phosphatase: 95 U/L (ref 38–126)
Anion gap: 7 (ref 5–15)
BUN: 18 mg/dL (ref 6–20)
CO2: 18 mmol/L — ABNORMAL LOW (ref 22–32)
Calcium: 8.5 mg/dL — ABNORMAL LOW (ref 8.9–10.3)
Chloride: 113 mmol/L — ABNORMAL HIGH (ref 98–111)
Creatinine: 1.54 mg/dL — ABNORMAL HIGH (ref 0.61–1.24)
GFR, Estimated: 54 mL/min — ABNORMAL LOW (ref >60)
Glucose, Bld: 143 mg/dL — ABNORMAL HIGH (ref 70–99)
Potassium: 3.8 mmol/L (ref 3.5–5.1)
Sodium: 138 mmol/L (ref 135–145)
Total Bilirubin: 0.6 mg/dL (ref 0.3–1.2)
Total Protein: 6.1 g/dL — ABNORMAL LOW (ref 6.5–8.1)

## 2021-05-22 LAB — RETICULOCYTES
Immature Retic Fract: 13.7 % (ref 2.3–15.9)
RBC.: 3.89 MIL/uL — ABNORMAL LOW (ref 4.22–5.81)
Retic Count, Absolute: 62.2 10*3/uL (ref 19.0–186.0)
Retic Ct Pct: 1.6 % (ref 0.4–3.1)

## 2021-05-22 LAB — IRON AND TIBC
Iron: 71 ug/dL (ref 42–163)
Saturation Ratios: 18 % — ABNORMAL LOW (ref 20–55)
TIBC: 384 ug/dL (ref 202–409)
UIBC: 313 ug/dL (ref 117–376)

## 2021-05-22 LAB — CEA (IN HOUSE-CHCC): CEA (CHCC-In House): 4.72 ng/mL (ref 0.00–5.00)

## 2021-05-22 LAB — FERRITIN: Ferritin: 20 ng/mL — ABNORMAL LOW (ref 24–336)

## 2021-05-22 LAB — LACTATE DEHYDROGENASE: LDH: 120 U/L (ref 98–192)

## 2021-05-22 NOTE — Telephone Encounter (Signed)
Per 05/22/21 los - gave upcoming appointments - confirmed

## 2021-05-22 NOTE — Progress Notes (Signed)
Hematology and Oncology Follow Up Visit  Keith [name] [id number] 27-Apr-1971 50 y.o. 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  He is gaining a little bit of weight.  However, he is having some issues with intermittent abdominal pain.  He says that he had this recently.  It lasted for almost a day.  He got better on its own.  He had a couple episodes of vomiting.  I really had to believe that he has scar tissue that might be causing intermittent bowel obstruction.  We will have to see about a CT scan to make sure there is no evidence of recurrent cancer.  He has had no problems with his bowels.  He has had no diarrhea.  He is urinating okay.  He has had no cough or shortness of breath.  His last CEA level which was back in February was 3.2.  He has had no fever.  Has had no headache.  He has had no rashes.  Is been no leg swelling.  I am sure he goes back to see Dr. Crisoforo Oxford at [facility].  He does have a history of stage Ib stomach cancer.  Currently, his performance status is ECOG 1.   Medications:  Allergies as of 05/22/2021   No Known Allergies      Medication List        Accurate as of May 22, 2021 11:29 AM. If you have any questions, ask your nurse or doctor.          acetaminophen 325 MG tablet Commonly known as: TYLENOL Take by mouth.   carboxymethylcellulose 0.5 % Soln Commonly known as: REFRESH PLUS Place 1  drop into both eyes 3 (three) times daily as needed (dry/irritated eyes.).   co-enzyme Q-10 30 MG capsule Take 30 mg by mouth as needed. 08/07/2020 Takes 2 times/weekly.   enoxaparin 40 MG/0.4ML injection Commonly known as: LOVENOX Inject into the skin.   lipase/protease/amylase 36000 UNITS Cpep capsule Commonly known as: Creon Take 1 capsule (36,000 Units total) by mouth 3 (three) times daily with meals.   oxyCODONE 5 MG immediate release tablet Commonly known as: Oxy IR/ROXICODONE Take by mouth.   sulfamethoxazole-trimethoprim 800-160 MG tablet Commonly known as: BACTRIM DS Take 1 tablet by mouth 2 (two) times daily.   VITAMIN B-12 PO Take by mouth as needed.   Vitamin D (Ergocalciferol) 1.25 MG (50000 UNIT) Caps capsule Commonly known as: DRISDOL Take 1 tablet by mouth daily.        Allergies: No Known Allergies  Past Medical History, Surgical history, Social history, and Family History were reviewed and updated.  Review of Systems: Review of Systems  Constitutional:  Positive for weight loss.  HENT: Negative.    Eyes: Negative.   Respiratory: Negative.    Cardiovascular: Negative.   Gastrointestinal: Negative.   Genitourinary: Negative.   Musculoskeletal: Negative.   Skin: Negative.   Neurological: Negative.  Endo/Heme/Allergies: Negative.   Psychiatric/Behavioral: Negative.      Physical Exam:  weight is 112 lb (50.8 kg). His oral temperature is 98.6 F (37 C). His blood pressure is 109/81 and his pulse is 62. His respiration is 17 and oxygen saturation is 100%.   Wt Readings from Last 3 Encounters:  05/22/21 112 lb (50.8 kg)  01/15/21 109 lb (49.4 kg)  10/25/20 105 lb (47.6 kg)    Physical Exam Vitals reviewed.  HENT:     Head: Normocephalic and atraumatic.  Eyes:     Pupils: Pupils are equal, round, and reactive to light.  Cardiovascular:     Rate and Rhythm: Normal rate and regular rhythm.     Heart sounds: Normal heart sounds.   Pulmonary:     Effort: Pulmonary effort is normal.     Breath sounds: Normal breath sounds.  Abdominal:     General: Bowel sounds are normal.     Palpations: Abdomen is soft.     Comments: Abdominal exam shows a soft abdomen.  He has dressing over the ileostomy site.  His laparotomy scar in the midline is well-healed.  He has no fluid wave.  There is no guarding or rebound tenderness.  There is no palpable liver or spleen tip.    Musculoskeletal:        General: No tenderness or deformity. Normal range of motion.     Cervical back: Normal range of motion.  Lymphadenopathy:     Cervical: No cervical adenopathy.  Skin:    General: Skin is warm and dry.     Findings: No erythema or rash.  Neurological:     Mental Status: He is alert and oriented to person, place, and time.  Psychiatric:        Behavior: Behavior normal.        Thought Content: Thought content normal.        Judgment: Judgment normal.   Lab Results  Component Value Date   WBC 4.0 05/22/2021   HGB 11.3 (L) 05/22/2021   HCT 33.8 (L) 05/22/2021   MCV 86.2 05/22/2021   PLT 149 (L) 05/22/2021   Lab Results  Component Value Date   FERRITIN 85 09/18/2019   IRON 95 09/18/2019   TIBC 331 09/18/2019   UIBC 237 09/18/2019   IRONPCTSAT 29 09/18/2019   Lab Results  Component Value Date   RETICCTPCT 1.6 05/22/2021   RBC 3.89 (L) 05/22/2021   RBC 3.92 (L) 05/22/2021   No results found for: KPAFRELGTCHN, LAMBDASER, KAPLAMBRATIO No results found for: IGGSERUM, IGA, IGMSERUM No results found for: Odetta Pink, SPEI   Chemistry      Component Value Date/Time   NA 138 05/22/2021 1025   NA 147 (H) 08/06/2017 0816   NA 144 02/04/2017 0758   K 3.8 05/22/2021 1025   K 4.6 08/06/2017 0816   K 4.6 02/04/2017 0758   CL 113 (H) 05/22/2021 1025   CL 104 08/06/2017 0816   CO2 18 (L) 05/22/2021 1025   CO2 30 08/06/2017 0816   CO2 25 02/04/2017 0758   BUN 18 05/22/2021  1025   BUN 12 08/06/2017 0816   BUN 14.2 02/04/2017 0758   CREATININE 1.54 (H) 05/22/2021 1025   CREATININE 0.9 08/06/2017 0816   CREATININE 0.9 02/04/2017 0758      Component Value Date/Time   CALCIUM 8.5 (L) 05/22/2021 1025   CALCIUM 9.0 08/06/2017 0816   CALCIUM 8.9 02/04/2017 0758  ALKPHOS 95 05/22/2021 1025   ALKPHOS 108 (H) 08/06/2017 0816   ALKPHOS 113 02/04/2017 0758   AST 17 05/22/2021 1025   AST 24 02/04/2017 0758   ALT 14 05/22/2021 1025   ALT 27 08/06/2017 0816   ALT 20 02/04/2017 0758   BILITOT 0.6 05/22/2021 1025   BILITOT 0.91 02/04/2017 0758       Impression and Plan: Mr. Cellucci is 50 yo [place] gentleman with history of locally advanced small bowel cancer. He underwent resection and had 9 positive lymph nodes putting him at  risk for recurrence. He underwent aggressive adjuvant therapy with both chemotherapy and radiation therapy completed in October 2015.  He also has a LEFT ureteral papillary carcinoma -  stage II. He has high MSI and high TMB levels. We tried him on adjuvant chemotherapy with cis-platinum/gemcitabine.  After 2 cycles, we stopped secondary to hepatotoxicity.  He was then found to have a third malignancy.  This was a stage I adenocarcinoma of the stomach.  This was removed on 08/28/2020.  He has the Lynch syndrome.  This certainly needs to be a prime consideration for any other malignancies.  I will set him up with a CT scan.  I hate the fact that he and his family do not go to [place].  I thought he and his family were supposed  to go the sometime this summer but never made it.  I would like to see him back in about 6 weeks now.  I know he has the Lynch syndrome.  Again, we will get have to be careful as he is at risk for other malignancies.   Volanda Napoleon, MD 9/15/202211:29 AM

## 2021-05-27 ENCOUNTER — Ambulatory Visit (HOSPITAL_BASED_OUTPATIENT_CLINIC_OR_DEPARTMENT_OTHER): Payer: Managed Care, Other (non HMO)

## 2021-06-02 ENCOUNTER — Ambulatory Visit (HOSPITAL_BASED_OUTPATIENT_CLINIC_OR_DEPARTMENT_OTHER): Payer: Managed Care, Other (non HMO)

## 2021-06-03 ENCOUNTER — Other Ambulatory Visit: Payer: Self-pay | Admitting: Family

## 2021-06-03 DIAGNOSIS — C178 Malignant neoplasm of overlapping sites of small intestine: Secondary | ICD-10-CM

## 2021-06-10 ENCOUNTER — Ambulatory Visit
Admission: RE | Admit: 2021-06-10 | Discharge: 2021-06-10 | Disposition: A | Discharge status: Home / Self Care | Payer: Managed Care, Other (non HMO) | Source: Ambulatory Visit | Attending: Family | Admitting: Family

## 2021-06-10 ENCOUNTER — Other Ambulatory Visit: Payer: Self-pay

## 2021-06-10 DIAGNOSIS — C178 Malignant neoplasm of overlapping sites of small intestine: Secondary | ICD-10-CM

## 2021-06-10 MED ORDER — IOPAMIDOL (ISOVUE-300) INJECTION 61%
100.0000 mL | Freq: Once | INTRAVENOUS | Status: AC | PRN
  Administered 2021-06-10: 100 mL via INTRAVENOUS

## 2021-06-11 ENCOUNTER — Telehealth: Payer: Self-pay | Admitting: *Deleted

## 2021-06-11 NOTE — Telephone Encounter (Signed)
-----   Message from Volanda Napoleon, MD sent at 06/11/2021  5:43 PM EDT ----- Please call let him know that there is no cancer that we can see on his scans.  Laurey Arrow

## 2021-06-11 NOTE — Telephone Encounter (Signed)
As noted below by Dr. Marin Olp, I informed the patient that there is no cancer that we can see on the scans. He verbalized understanding.

## 2021-07-21 ENCOUNTER — Inpatient Hospital Stay (HOSPITAL_BASED_OUTPATIENT_CLINIC_OR_DEPARTMENT_OTHER): Payer: Managed Care, Other (non HMO) | Admitting: Physician Assistant

## 2021-07-21 ENCOUNTER — Other Ambulatory Visit: Payer: Self-pay

## 2021-07-21 ENCOUNTER — Telehealth: Payer: Self-pay | Admitting: *Deleted

## 2021-07-21 ENCOUNTER — Inpatient Hospital Stay: Payer: Managed Care, Other (non HMO) | Attending: Hematology & Oncology

## 2021-07-21 ENCOUNTER — Encounter: Payer: Self-pay | Admitting: Physician Assistant

## 2021-07-21 VITALS — BP 134/76 | HR 67 | Temp 98.0°F | Resp 18 | Wt 118.2 lb

## 2021-07-21 DIAGNOSIS — C689 Malignant neoplasm of urinary organ, unspecified: Secondary | ICD-10-CM

## 2021-07-21 DIAGNOSIS — D696 Thrombocytopenia, unspecified: Secondary | ICD-10-CM

## 2021-07-21 DIAGNOSIS — C178 Malignant neoplasm of overlapping sites of small intestine: Secondary | ICD-10-CM

## 2021-07-21 DIAGNOSIS — C162 Malignant neoplasm of body of stomach: Secondary | ICD-10-CM

## 2021-07-21 DIAGNOSIS — R634 Abnormal weight loss: Secondary | ICD-10-CM | POA: Diagnosis not present

## 2021-07-21 DIAGNOSIS — C169 Malignant neoplasm of stomach, unspecified: Secondary | ICD-10-CM | POA: Diagnosis not present

## 2021-07-21 DIAGNOSIS — C662 Malignant neoplasm of left ureter: Secondary | ICD-10-CM | POA: Diagnosis not present

## 2021-07-21 DIAGNOSIS — Z1509 Genetic susceptibility to other malignant neoplasm: Secondary | ICD-10-CM | POA: Insufficient documentation

## 2021-07-21 DIAGNOSIS — Z85068 Personal history of other malignant neoplasm of small intestine: Secondary | ICD-10-CM | POA: Diagnosis not present

## 2021-07-21 DIAGNOSIS — Z9221 Personal history of antineoplastic chemotherapy: Secondary | ICD-10-CM | POA: Diagnosis not present

## 2021-07-21 DIAGNOSIS — Z923 Personal history of irradiation: Secondary | ICD-10-CM | POA: Diagnosis not present

## 2021-07-21 DIAGNOSIS — Z86718 Personal history of other venous thrombosis and embolism: Secondary | ICD-10-CM | POA: Diagnosis not present

## 2021-07-21 LAB — CBC WITH DIFFERENTIAL (CANCER CENTER ONLY)
Abs Immature Granulocytes: 0.01 10*3/uL (ref 0.00–0.07)
Basophils Absolute: 0 10*3/uL (ref 0.0–0.1)
Basophils Relative: 1 %
Eosinophils Absolute: 0.3 10*3/uL (ref 0.0–0.5)
Eosinophils Relative: 6 %
HCT: 39.2 % (ref 39.0–52.0)
Hemoglobin: 12.5 g/dL — ABNORMAL LOW (ref 13.0–17.0)
Immature Granulocytes: 0 %
Lymphocytes Relative: 30 %
Lymphs Abs: 1.4 10*3/uL (ref 0.7–4.0)
MCH: 29.4 pg (ref 26.0–34.0)
MCHC: 31.9 g/dL (ref 30.0–36.0)
MCV: 92.2 fL (ref 80.0–100.0)
Monocytes Absolute: 0.3 10*3/uL (ref 0.1–1.0)
Monocytes Relative: 7 %
Neutro Abs: 2.6 10*3/uL (ref 1.7–7.7)
Neutrophils Relative %: 56 %
Platelet Count: 121 10*3/uL — ABNORMAL LOW (ref 150–400)
RBC: 4.25 MIL/uL (ref 4.22–5.81)
RDW: 14.3 % (ref 11.5–15.5)
WBC Count: 4.7 10*3/uL (ref 4.0–10.5)
nRBC: 0 % (ref 0.0–0.2)

## 2021-07-21 LAB — CMP (CANCER CENTER ONLY)
ALT: 17 U/L (ref 0–44)
AST: 23 U/L (ref 15–41)
Albumin: 4.2 g/dL (ref 3.5–5.0)
Alkaline Phosphatase: 130 U/L — ABNORMAL HIGH (ref 38–126)
Anion gap: 8 (ref 5–15)
BUN: 18 mg/dL (ref 6–20)
CO2: 23 mmol/L (ref 22–32)
Calcium: 8.9 mg/dL (ref 8.9–10.3)
Chloride: 109 mmol/L (ref 98–111)
Creatinine: 1.67 mg/dL — ABNORMAL HIGH (ref 0.61–1.24)
GFR, Estimated: 49 mL/min — ABNORMAL LOW (ref >60)
Glucose, Bld: 190 mg/dL — ABNORMAL HIGH (ref 70–99)
Potassium: 3.8 mmol/L (ref 3.5–5.1)
Sodium: 140 mmol/L (ref 135–145)
Total Bilirubin: 0.6 mg/dL (ref 0.3–1.2)
Total Protein: 6.6 g/dL (ref 6.5–8.1)

## 2021-07-21 LAB — VITAMIN B12: Vitamin B-12: 549 pg/mL (ref 180–914)

## 2021-07-21 LAB — LACTATE DEHYDROGENASE: LDH: 155 U/L (ref 98–192)

## 2021-07-21 LAB — FOLATE: Folate: 15.9 ng/mL (ref >5.9)

## 2021-07-21 LAB — CEA (IN HOUSE-CHCC): CEA (CHCC-In House): 6.2 ng/mL — ABNORMAL HIGH (ref 0.00–5.00)

## 2021-07-21 NOTE — Telephone Encounter (Signed)
Per 07/21/21 los - gave upcoming appointments - confirmed

## 2021-07-21 NOTE — Progress Notes (Signed)
Hematology and Oncology Follow Up Visit  Keith [name] [id number] 1971-01-25 50 y.o. 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 Since the last visit, patient continues to gain weight and is eating better. He reports that his energy levels are unchanged and he continues to complete his daily ADLs on his own. He reports "increased activity in his stomach" but denies any nausea or vomiting. Additionally, he notes that his bowel movements are fairly regular and he denies any diarrhea or constipation. He denies easy bruising or signs of bleeding. This includes hematochezia or melena.He denies fevers, chills, night sweats, shortness of breath, chest pain or cough. He has no other complaints. Rest of the ROS is negative.    Medications:  Allergies as of 07/21/2021   No Known Allergies      Medication List        Accurate as of July 21, 2021  9:48 AM. If you have any questions, ask your nurse or doctor.          acetaminophen 325 MG tablet Commonly known as: TYLENOL Take by mouth.   carboxymethylcellulose 0.5 % Soln Commonly known as: REFRESH PLUS Place 1 drop into both eyes 3 (three) times daily as needed (dry/irritated eyes.).   co-enzyme Q-10 30 MG capsule Take 30 mg by mouth as needed. 08/07/2020 Takes 2 times/weekly.   enoxaparin 40 MG/0.4ML injection Commonly known as: LOVENOX Inject into the skin.    lipase/protease/amylase 36000 UNITS Cpep capsule Commonly known as: Creon Take 1 capsule (36,000 Units total) by mouth 3 (three) times daily with meals.   oxyCODONE 5 MG immediate release tablet Commonly known as: Oxy IR/ROXICODONE Take by mouth.   sulfamethoxazole-trimethoprim 800-160 MG tablet Commonly known as: BACTRIM DS Take 1 tablet by mouth 2 (two) times daily.   VITAMIN B-12 PO Take by mouth as needed.   Vitamin D (Ergocalciferol) 1.25 MG (50000 UNIT) Caps capsule Commonly known as: DRISDOL Take 1 tablet by mouth daily.        Allergies: No Known Allergies  Past Medical History, Surgical history, Social history, and Family History were reviewed and updated.  Review of Systems: Review of Systems  Constitutional: Negative.   HENT: Negative.    Eyes: Negative.   Respiratory: Negative.    Cardiovascular: Negative.   Gastrointestinal: Negative.   Genitourinary: Negative.   Musculoskeletal: Negative.   Skin: Negative.   Neurological: Negative.   Endo/Heme/Allergies: Negative.   Psychiatric/Behavioral: Negative.      Physical Exam:  weight is 118 lb 4 oz (53.6 kg). His oral temperature is 98 F (36.7 C). His blood pressure is 134/76 and his pulse is 67. His respiration is 18 and oxygen saturation is 100%.   Wt Readings from Last 3 Encounters:  07/21/21 118 lb 4 oz (53.6 kg)  05/22/21 112 lb (50.8 kg)  01/15/21 109 lb (49.4 kg)    Physical Exam Vitals  reviewed.  HENT:     Head: Normocephalic and atraumatic.  Eyes:     Pupils: Pupils are equal, round, and reactive to light.  Cardiovascular:     Rate and Rhythm: Normal rate and regular rhythm.     Heart sounds: Normal heart sounds.  Pulmonary:     Effort: Pulmonary effort is normal.     Breath sounds: Normal breath sounds.  Abdominal:     General: Bowel sounds are normal.     Palpations: Abdomen is soft.     Comments: Abdominal exam shows a soft abdomen.  He has dressing over the ileostomy site.   His laparotomy scar in the midline is well-healed.  He has no fluid wave.  There is no guarding or rebound tenderness.  There is no palpable liver or spleen tip.    Musculoskeletal:        General: No tenderness or deformity. Normal range of motion.     Cervical back: Normal range of motion.  Lymphadenopathy:     Cervical: No cervical adenopathy.  Skin:    General: Skin is warm and dry.     Findings: No erythema or rash.  Neurological:     Mental Status: He is alert and oriented to person, place, and time.  Psychiatric:        Behavior: Behavior normal.        Thought Content: Thought content normal.        Judgment: Judgment normal.   Lab Results  Component Value Date   WBC 4.7 07/21/2021   HGB 12.5 (L) 07/21/2021   HCT 39.2 07/21/2021   MCV 92.2 07/21/2021   PLT 121 (L) 07/21/2021   Lab Results  Component Value Date   FERRITIN 20 (L) 05/22/2021   IRON 71 05/22/2021   TIBC 384 05/22/2021   UIBC 313 05/22/2021   IRONPCTSAT 18 (L) 05/22/2021   Lab Results  Component Value Date   RETICCTPCT 1.6 05/22/2021   RBC 4.25 07/21/2021   No results found for: KPAFRELGTCHN, LAMBDASER, KAPLAMBRATIO No results found for: IGGSERUM, IGA, IGMSERUM No results found for: Kathrynn Ducking, MSPIKE, SPEI   Chemistry      Component Value Date/Time   NA 138 05/22/2021 1025   NA 147 (H) 08/06/2017 0816   NA 144 02/04/2017 0758   K 3.8 05/22/2021 1025   K 4.6 08/06/2017 0816   K 4.6 02/04/2017 0758   CL 113 (H) 05/22/2021 1025   CL 104 08/06/2017 0816   CO2 18 (L) 05/22/2021 1025   CO2 30 08/06/2017 0816   CO2 25 02/04/2017 0758   BUN 18 05/22/2021 1025   BUN 12 08/06/2017 0816   BUN 14.2 02/04/2017 0758   CREATININE 1.54 (H) 05/22/2021 1025   CREATININE 0.9 08/06/2017 0816   CREATININE 0.9 02/04/2017 0758      Component Value Date/Time   CALCIUM 8.5 (L) 05/22/2021 1025   CALCIUM 9.0 08/06/2017 0816   CALCIUM 8.9 02/04/2017 0758    ALKPHOS 95 05/22/2021 1025   ALKPHOS 108 (H) 08/06/2017 0816   ALKPHOS 113 02/04/2017 0758   AST 17 05/22/2021 1025   AST 24 02/04/2017 0758   ALT 14 05/22/2021 1025   ALT 27 08/06/2017 0816   ALT 20 02/04/2017 0758   BILITOT 0.6 05/22/2021 1025   BILITOT 0.91 02/04/2017 0758       Impression:  Keith [name] is 50 yo [place] gentleman returns for a follow up for history of multiple maglinancies  in the setting of Lynch syndrome.  He has history of locally advanced small bowel cancer. He underwent resection and had 9 positive lymph nodes putting him at  risk for recurrence. He underwent aggressive adjuvant therapy with both chemotherapy and radiation therapy completed in October 2015.  He also has a LEFT ureteral papillary carcinoma -  stage II. He has high MSI and high TMB levels. We tried him on adjuvant chemotherapy with cis-platinum/gemcitabine.  After 2 cycles, we stopped secondary to hepatotoxicity.  He was then found to have a third malignancy.  This was a stage I adenocarcinoma of the stomach.  This was removed on 08/28/2020.   Plan: -CT scan from 06/10/2021 shows no evidence of recurrence or metastatic disease.  -Labs from today were reviewed. Anemia has improved to 12.5, there is mild thrombocytopenia with Plt of 121K. Creatinine is overall stable. CEA increased slightly to 6.20.  -Continue on surveillance but return in 3 months to repeat CEA level and then return in 6 months for an office visit with labs.   Patient expressed understanding and satisfaction with the plan provided.   I have spent a total of 30 minutes minutes of face-to-face and non-face-to-face time, preparing to see the [place] a medically appropriate examination, counseling and educating the patient, ordering tests, documenting clinical information in the electronic health record, independently interpreting results and communicating results to the patient, and care coordination.    Lincoln Brigham  PA-C H[facility]ology and [place] [place], [place] 11/14/20229:48 AM

## 2021-07-24 ENCOUNTER — Telehealth: Payer: Self-pay

## 2021-07-24 ENCOUNTER — Encounter: Payer: Self-pay | Admitting: Hematology & Oncology

## 2021-07-24 NOTE — Telephone Encounter (Signed)
Spoke with Keith [name] by phone to say CEA was slightly up with recent lab and Dr. Marin Olp would like to repeat it in 3 months.

## 2021-10-24 ENCOUNTER — Other Ambulatory Visit: Payer: Self-pay

## 2021-10-24 ENCOUNTER — Inpatient Hospital Stay: Payer: Managed Care, Other (non HMO) | Attending: Hematology & Oncology

## 2021-10-24 DIAGNOSIS — C689 Malignant neoplasm of urinary organ, unspecified: Secondary | ICD-10-CM

## 2021-10-24 DIAGNOSIS — Z85068 Personal history of other malignant neoplasm of small intestine: Secondary | ICD-10-CM | POA: Diagnosis not present

## 2021-10-24 DIAGNOSIS — C662 Malignant neoplasm of left ureter: Secondary | ICD-10-CM | POA: Diagnosis not present

## 2021-10-24 DIAGNOSIS — Z86718 Personal history of other venous thrombosis and embolism: Secondary | ICD-10-CM | POA: Insufficient documentation

## 2021-10-24 DIAGNOSIS — D696 Thrombocytopenia, unspecified: Secondary | ICD-10-CM | POA: Insufficient documentation

## 2021-10-24 DIAGNOSIS — Z9221 Personal history of antineoplastic chemotherapy: Secondary | ICD-10-CM | POA: Insufficient documentation

## 2021-10-24 DIAGNOSIS — R634 Abnormal weight loss: Secondary | ICD-10-CM | POA: Diagnosis not present

## 2021-10-24 DIAGNOSIS — Z1509 Genetic susceptibility to other malignant neoplasm: Secondary | ICD-10-CM | POA: Diagnosis not present

## 2021-10-24 DIAGNOSIS — Z923 Personal history of irradiation: Secondary | ICD-10-CM | POA: Insufficient documentation

## 2021-10-24 DIAGNOSIS — C169 Malignant neoplasm of stomach, unspecified: Secondary | ICD-10-CM | POA: Insufficient documentation

## 2021-10-24 DIAGNOSIS — C178 Malignant neoplasm of overlapping sites of small intestine: Secondary | ICD-10-CM

## 2021-10-24 DIAGNOSIS — C162 Malignant neoplasm of body of stomach: Secondary | ICD-10-CM

## 2021-10-24 LAB — CMP (CANCER CENTER ONLY)
ALT: 16 U/L (ref 0–44)
AST: 21 U/L (ref 15–41)
Albumin: 3.7 g/dL (ref 3.5–5.0)
Alkaline Phosphatase: 127 U/L — ABNORMAL HIGH (ref 38–126)
Anion gap: 5 (ref 5–15)
BUN: 18 mg/dL (ref 6–20)
CO2: 26 mmol/L (ref 22–32)
Calcium: 8.4 mg/dL — ABNORMAL LOW (ref 8.9–10.3)
Chloride: 112 mmol/L — ABNORMAL HIGH (ref 98–111)
Creatinine: 1.99 mg/dL — ABNORMAL HIGH (ref 0.61–1.24)
GFR, Estimated: 40 mL/min — ABNORMAL LOW (ref >60)
Glucose, Bld: 89 mg/dL (ref 70–99)
Potassium: 4.4 mmol/L (ref 3.5–5.1)
Sodium: 143 mmol/L (ref 135–145)
Total Bilirubin: 0.7 mg/dL (ref 0.3–1.2)
Total Protein: 6.4 g/dL — ABNORMAL LOW (ref 6.5–8.1)

## 2021-10-24 LAB — CBC WITH DIFFERENTIAL (CANCER CENTER ONLY)
Abs Immature Granulocytes: 0.02 10*3/uL (ref 0.00–0.07)
Basophils Absolute: 0 10*3/uL (ref 0.0–0.1)
Basophils Relative: 1 %
Eosinophils Absolute: 0.4 10*3/uL (ref 0.0–0.5)
Eosinophils Relative: 8 %
HCT: 36.9 % — ABNORMAL LOW (ref 39.0–52.0)
Hemoglobin: 12.1 g/dL — ABNORMAL LOW (ref 13.0–17.0)
Immature Granulocytes: 0 %
Lymphocytes Relative: 30 %
Lymphs Abs: 1.5 10*3/uL (ref 0.7–4.0)
MCH: 30.1 pg (ref 26.0–34.0)
MCHC: 32.8 g/dL (ref 30.0–36.0)
MCV: 91.8 fL (ref 80.0–100.0)
Monocytes Absolute: 0.4 10*3/uL (ref 0.1–1.0)
Monocytes Relative: 8 %
Neutro Abs: 2.6 10*3/uL (ref 1.7–7.7)
Neutrophils Relative %: 53 %
Platelet Count: 121 10*3/uL — ABNORMAL LOW (ref 150–400)
RBC: 4.02 MIL/uL — ABNORMAL LOW (ref 4.22–5.81)
RDW: 13.9 % (ref 11.5–15.5)
WBC Count: 4.9 10*3/uL (ref 4.0–10.5)
nRBC: 0 % (ref 0.0–0.2)

## 2021-10-24 LAB — CEA (IN HOUSE-CHCC): CEA (CHCC-In House): 6.33 ng/mL — ABNORMAL HIGH (ref 0.00–5.00)

## 2021-10-31 ENCOUNTER — Other Ambulatory Visit: Payer: Self-pay | Admitting: Family

## 2021-10-31 DIAGNOSIS — C162 Malignant neoplasm of body of stomach: Secondary | ICD-10-CM

## 2021-10-31 DIAGNOSIS — C689 Malignant neoplasm of urinary organ, unspecified: Secondary | ICD-10-CM

## 2021-10-31 DIAGNOSIS — C178 Malignant neoplasm of overlapping sites of small intestine: Secondary | ICD-10-CM

## 2021-11-05 ENCOUNTER — Other Ambulatory Visit: Payer: Self-pay | Admitting: Family

## 2021-11-05 DIAGNOSIS — C178 Malignant neoplasm of overlapping sites of small intestine: Secondary | ICD-10-CM

## 2021-11-05 DIAGNOSIS — C162 Malignant neoplasm of body of stomach: Secondary | ICD-10-CM

## 2021-11-05 DIAGNOSIS — R970 Elevated carcinoembryonic antigen [CEA]: Secondary | ICD-10-CM

## 2021-11-07 ENCOUNTER — Ambulatory Visit (HOSPITAL_BASED_OUTPATIENT_CLINIC_OR_DEPARTMENT_OTHER): Payer: Managed Care, Other (non HMO)

## 2021-11-21 ENCOUNTER — Ambulatory Visit
Admission: RE | Admit: 2021-11-21 | Discharge: 2021-11-21 | Disposition: A | Discharge status: Home / Self Care | Payer: Managed Care, Other (non HMO) | Source: Ambulatory Visit | Attending: Family | Admitting: Family

## 2021-11-21 DIAGNOSIS — C178 Malignant neoplasm of overlapping sites of small intestine: Secondary | ICD-10-CM

## 2021-11-21 DIAGNOSIS — C162 Malignant neoplasm of body of stomach: Secondary | ICD-10-CM

## 2021-11-21 DIAGNOSIS — R970 Elevated carcinoembryonic antigen [CEA]: Secondary | ICD-10-CM

## 2021-11-21 MED ORDER — IOPAMIDOL (ISOVUE-300) INJECTION 61%
80.0000 mL | Freq: Once | INTRAVENOUS | Status: AC | PRN
  Administered 2021-11-21: 80 mL via INTRAVENOUS

## 2021-12-09 ENCOUNTER — Encounter: Payer: Self-pay | Admitting: Hematology & Oncology

## 2021-12-12 ENCOUNTER — Other Ambulatory Visit: Payer: Self-pay | Admitting: Family

## 2021-12-12 ENCOUNTER — Telehealth: Payer: Self-pay | Admitting: Family

## 2021-12-12 DIAGNOSIS — C178 Malignant neoplasm of overlapping sites of small intestine: Secondary | ICD-10-CM

## 2021-12-12 NOTE — Telephone Encounter (Signed)
After reviewing his CT scan with Dr. Marin Olp, I called and spoke with the patient regarding the thickening noted within the rectosigmoid junction. We have placed an urgent referral with Dr. Bryan Lemma, who the patient has seen in the past, for colonoscopy.   ?He is in agreement with the plan. No questions or concerns at this time. Patient appreciative of call.  ?

## 2021-12-15 ENCOUNTER — Telehealth: Payer: Self-pay | Admitting: *Deleted

## 2021-12-15 NOTE — Telephone Encounter (Signed)
Faxed referral to Dr. Cathleen Corti to [phone] ?

## 2021-12-18 ENCOUNTER — Telehealth: Payer: Self-pay | Admitting: Gastroenterology

## 2021-12-18 NOTE — Telephone Encounter (Signed)
Good Afternoon Dr. Bryan Lemma, ? ? 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 After looking at patients chart he has been seen by you and then was seen [facility].  Records are in Epic, will you please review and advise on scheduling? ? ? ?Thank you.  ?

## 2021-12-19 NOTE — Telephone Encounter (Signed)
4-17 at 9:50am. Made with patient. He knows the location he said ?

## 2021-12-19 NOTE — Telephone Encounter (Signed)
Yes, ok to schedule appt with me for ongoing care. Please schedule expedited appt with me in the office. Overbook is ok. Will also plan for expedited endoscopic evaluation, which will be discussed at that appt. Thanks.  ?

## 2021-12-22 ENCOUNTER — Encounter: Payer: Self-pay | Admitting: Gastroenterology

## 2021-12-22 ENCOUNTER — Ambulatory Visit (INDEPENDENT_AMBULATORY_CARE_PROVIDER_SITE_OTHER): Payer: Managed Care, Other (non HMO) | Admitting: Gastroenterology

## 2021-12-22 VITALS — BP 92/60 | HR 61 | Ht 66.0 in | Wt 123.4 lb

## 2021-12-22 DIAGNOSIS — Z1509 Genetic susceptibility to other malignant neoplasm: Secondary | ICD-10-CM

## 2021-12-22 DIAGNOSIS — Z90410 Acquired total absence of pancreas: Secondary | ICD-10-CM

## 2021-12-22 DIAGNOSIS — Z85028 Personal history of other malignant neoplasm of stomach: Secondary | ICD-10-CM

## 2021-12-22 DIAGNOSIS — R9389 Abnormal findings on diagnostic imaging of other specified body structures: Secondary | ICD-10-CM

## 2021-12-22 DIAGNOSIS — C662 Malignant neoplasm of left ureter: Secondary | ICD-10-CM

## 2021-12-22 DIAGNOSIS — Z809 Family history of malignant neoplasm, unspecified: Secondary | ICD-10-CM

## 2021-12-22 DIAGNOSIS — Z85068 Personal history of other malignant neoplasm of small intestine: Secondary | ICD-10-CM

## 2021-12-22 DIAGNOSIS — R748 Abnormal levels of other serum enzymes: Secondary | ICD-10-CM

## 2021-12-22 DIAGNOSIS — Z9049 Acquired absence of other specified parts of digestive tract: Secondary | ICD-10-CM

## 2021-12-22 MED ORDER — CLENPIQ 10-3.5-12 MG-GM -GM/160ML PO SOLN: 1.0000 | ORAL | 0 refills | Status: DC

## 2021-12-22 NOTE — Patient Instructions (Addendum)
If you are age 53 or younger, your body mass index should be between 19-25. Your Body mass index is 19.91 kg/m?[name] If this is out of the aformentioned range listed, please consider follow up with your Primary Care Provider.  ? ?__________________________________________________________ ? ?[facility] providers would like to encourage you to use [facility] to communicate with providers for non-urgent requests or questions.  Due to long hold times on the telephone, sending your provider a message by [facility] may be a faster and more efficient way to get a response.  Please allow 48 business hours for a response.  Please remember that this is for non-urgent requests.  ? ?Due to recent changes in healthcare laws, you may see the results of your imaging and laboratory studies on MyChart before your provider has had a chance to review them.  We understand that in some cases there may be results that are confusing or concerning to you. Not all laboratory results come back in the same time frame and the provider may be waiting for multiple results in order to interpret others.  Please give [place] 48 hours in order for your provider to thoroughly review all the results before contacting the office for clarification of your results.   ? ?We have sent the following medications to your pharmacy for you to pick up at your convenience: Clenpiq ? ?You have been scheduled for an endoscopy and colonoscopy. Please follow the written instructions given to you at your visit today. ?Please pick up your prep supplies at the pharmacy within the next 1-3 days. ?If you use inhalers (even only as needed), please bring them with you on the day of your procedure.  ? ?Thank you for choosing me and [facility]. ? ?Gerrit Heck, D.O.  ? ? ?We want to thank you for trusting [facility] with your care. All of our staff and providers value the relationships we have built with our patients, and it is an honor to care  for you.  ? ?We are writing to let you know that [facility] will close on Jan 19, 2022, and we invite you to continue to see Dr. Carmell Austria and Gerrit Heck at the [facility] [place] office location. We are consolidating our serices at these [facility] practices to better provide care. Our office staff will work with you to ensure a seamless transition.  ? ?Gerrit Heck, DO -Dr. Bryan Lemma will be movig to [facility] at [address] [place], [place] [address], effective Jan 19, 2022.  Contact [phone] to schedule an appointment with him.  ? ?Carmell Austria, MD- Dr. Lyndel Safe will be movig to [facility] at [address] [place], [place] [address], effective Jan 19, 2022.  Contact [phone] to schedule an appointment with him.  ? ?Requesting Medical Records ?If you need to request your medical records, please follow the instructions below. Your medical records are confidential, and a copy can be transferred to another provider or released to you or another person you designate only with your permission. ? ?There are several ways to request your medical records: ?Requests for medical records can be submitted through our practice.   ?You can also request your records electronically, in your MyChart account by selecting the ?Request Health Records? tab.  ?If you need additional information on how to request records, please go to [email or web address]/, choose Patient Information, then select Request Medical Records. ?To make an appointment or if you have any questions about  your health care needs, please contact our office at [phone] and one of our staff members will be glad to assist you. ?[facility] is committed to providing exceptional care for you and our community. Thank you for allowing [place] to serve your health care needs. ?Sincerely, ? ?Windy Canny, Director [facility] ?[facility] also offers convenient virtual care  options. Sore throat? Sinus problems? Cold or flu symptoms? Get care from the comfort of home with [facility] Video Visits and e-Visits. Learn more about the non-emergency conditions treated and start your virtual visit at [email or web address]   ?

## 2021-12-22 NOTE — Progress Notes (Signed)
? ?Chief Complaint:    Lynch Syndrome, elevated CEA, abnormal CT ? 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 ? ?Evaluated in the genetics clinic and diagnosed with MSH2-related Lynch Syndrome (HNPCC 1) ? ?Initially seen in the GI clinic in 06/2020 for evaluation of abdominal pain, and subsequent diagnosed with gastric signet carcinoma with EGD as above. ? ? ?FHx: ?- Father: Esophageal cancer ?- Mother: Ureteral CA ?- Sister: Ureteral CA ?- Sister: ?Stomach CA vs Colon cancer (died age 77) ?- Cousin: Colon CA ?- Paternal uncle: Colon cancer at age 40 ? ?Endoscopic History: ?- EGD (07/2020): Whipple anatomy, 10 mm marginal ulcer (path: Adenocarcinoma with signet ring features), mild gastritis.  Normal small bowel ?- Colonoscopy (07/2020): 3 subcentimeter polyps (path: Tubular adenomas), Tortuous colon, internal hemorrhoids.  Normal TI.  Repeat in 3 years ? ? ?HPI:   ? ? ?Patient is a 51 y.o. male presenting to the Gastroenterology Clinic for evaluation of abnormal imaging study, elevated CEA, history of Lynch Syndrome.  Initially seen by me in 06/2020 for evaluation of abdominal pain.  Was diagnosed with Adenocarcinoma with signet ring features on upper endoscopy.  Was subsequently seen at [facility], and treated with surgical resection alone (distal gastrectomy). Surgery c/b colon perforation treated with transverse colectomy and loop ileosotomy, which was reversed in 12/2020.  Has not been seen in this GI clinic since then.  Was last seen in the  Oncology clinic in 07/2021. ? ?-06/10/2021: CT A/P: Whipple anatomy, normal liver without duct dilation.  Normal spleen.  Stable mild wall thickening involving residual stomach, otherwise normal GI tract. ?-11/21/2021: CT A/P: Whipple anatomy, atrophic remaining pancreatic body and tail without duct dilation.  Normal liver with choledochojejunostomy.  Large stool burden in rectum.  Area of under distention and wall thickening in the rectosigmoid junction ? ?Reviewed recent labs as outlined below, notable for increasing CEA (6.33), stable anemia, mildly elevated ALP (127), elevated creatinine (1.99).  Normal B12, folate, LDH.  Ferritin 20, iron 71, TIBC 384, sat 18%. ? ?Ref Range & Units 1 mo ago ?(10/24/21) 5 mo ago ?(07/21/21) 7 mo ago ?(05/22/21) 1 yr ago ?(10/25/20) 1 yr ago ?(08/07/20) 1 yr ago ?(07/01/20) 1 yr ago ?(03/25/20)   ?CEA ([facility]) 0.00 - 5.00 ng/mL 6.33 High   6.20 High  CM  4.72 CM  3.24 CM  6.13 High  CM  5.25 High  CM  4.15 CM   ? ? ? ?  Latest Ref Rng & Units 10/24/2021  ?  8:07 AM 07/21/2021  ?  9:16 AM 05/22/2021  ? 10:25 AM  ?CBC  ?WBC 4.0 - 10.5 K/uL 4.9   4.7   4.0    ?Hemoglobin 13.0 - 17.0 g/dL 12.1   12.5   11.3    ?Hematocrit 39.0 - 52.0 % 36.9   39.2   33.8    ?Platelets 150 - 400 K/uL 121   121   149    ? ? ? ?  Latest Ref Rng & Units 10/24/2021  ?  8:07 AM 07/21/2021  ?  9:16 AM 05/22/2021  ?  10:25 AM  ?CMP  ?Glucose 70 - 99 mg/dL 89   190   143    ?BUN 6 - 20 mg/dL '18   18   18    ' ?Creatinine 0.61 - 1.24 mg/dL 1.99   1.67   1.54    ?Sodium 135 - 145 mmol/L 143   140   138    ?Potassium 3.5 - 5.1 mmol/L 4.4   3.8   3.8    ?Chloride 98 - 111 mmol/L 112   109   113    ?CO2 22 - 32 mmol/L '26   23   18    ' ?Calcium 8.9 - 10.3 mg/dL 8.4   8.9   8.5    ?Total Protein 6.5 - 8.1 g/dL 6.4   6.6   6.1    ?Total Bilirubin 0.3 - 1.2 mg/dL 0.7   0.6   0.6    ?Alkaline Phos 38 - 126 U/L 127   130   95    ?AST 15 - 41 U/L '21   23   17    ' ?ALT 0 - 44 U/L '16   17   14    ' ? ? ? ? ?Review of systems:     No  chest pain, no SOB, no fevers, no urinary sx  ? ?Past Medical History:  ?Diagnosis Date  ? Acute thrombosis of cephalic vein, right 1/76/1607  ? Blood transfusion without reported diagnosis   ? Cancer of body of stomach ([place]) 10/25/2020  ? Cancer of small intestine, including duodenum ([place]) 10/26/2013  ? small intestine adenocarcinoma stage IIIc   ? CHF (congestive heart failure) ([place])   ? patient denies  ? Chronic hepatitis B ([place])   ? Diabetes mellitus without complication ([place])   ? patient denies  ? Family history of cancer of genitourinary system   ? Family history of esophageal cancer   ? GERD (gastroesophageal reflux disease)   ? remote history  ? Hydronephrosis of left kidney   ? Hypertension   ? no medications at this time  ? Impaired fasting glucose   ? Lung nodules   ? 2 mm nodule in the posterior left lower lobe (4/115), stable. 4 mm ground-glass nodule in the apical segment right upper lobe (4/30), unchanged as well.  ? Platelets decreased ([place])   ? Radiation 01/31/14-03/06/14  ? upper abdomen/surgical bed  ? Urothelial cancer ([place]) 12/20/2019  ? ? ?Patient's surgical history, family medical history, social history, medications and allergies were all reviewed in Epic  ? ? ?Current Outpatient Medications  ?Medication Sig Dispense Refill  ? carboxymethylcellulose (REFRESH PLUS) 0.5 % SOLN Place 1 drop into both eyes 3 (three) times daily as needed (dry/irritated eyes.).     ? co-enzyme Q-10 30 MG capsule Take 30 mg by mouth as needed. 08/07/2020 Takes 2 times/weekly.    ? Cyanocobalamin (VITAMIN B-12 PO) Take by mouth as needed.    ? lipase/protease/amylase (CREON) 36000 UNITS CPEP capsule Take 1 capsule (36,000 Units total) by mouth 3 (three) times daily with meals. 90 capsule 0  ? Vitamin D, Ergocalciferol, (DRISDOL) 1.25 MG (50000 UNIT) CAPS capsule Take 1 tablet by mouth daily.    ? ?No current facility-administered medications for this visit.  ? ? ?Physical Exam:   ? ? ?BP 92/60 (BP Location: Left Arm,  Patient Position: Sitting, Cuff Size: Normal)   Pulse 61   Ht '5\' 6"'  (1.676 m)   Wt 123 lb 6 oz (56 kg)  SpO2 97%   BMI 19.91 kg/m?  ? ?GENERAL:  Pleasant male in NAD ?PSYCH: : Cooperative, normal affect ?ABDOMEN: Multiple well-healed abdominal surgical incisions.  Nondistended, soft, nontender.  ?SKIN:  turgor, no lesions seen ?Musculoskeletal:  Normal muscle tone, normal strength ?NEURO: Alert and oriented x 3, no focal neurologic deficits ? ? ?IMPRESSION and PLAN:   ? ?1) Lynch Syndrome ?2) History of gastric cancer s/p resection ?3) History of small bowel cancer s/p Whipple ?4) History of ureteral cancer ?5) Family history of multiple GI cancers ?6) Elevated CEA ?7) Abnormal CT scan of the colon ? ?- Colonoscopy now and every 1-2 years ?- Recommend starting daily aspirin for at least 2 years. Will start after EGD/Colo so do not complicate findings with ulcer, gastritis, etc. ?- Repeat EGD now and repeat every 3 years ?- No known family history of Pancreatic Cancer.  Per [place] and AGA guidelines, do not need to initiate specific PC screening protocol (remaining pancreas visualized on recent CT) ?- Will monitor mildly elevated ALP ? ?The indications, risks, and benefits of EGD and colonoscopy were explained to the patient in detail. Risks include but are not limited to bleeding, perforation, adverse reaction to medications, and cardiopulmonary compromise. Sequelae include but are not limited to the possibility of surgery, hospitalization, and mortality. The patient verbalized understanding and wished to proceed. All questions answered, referred to scheduler and bowel prep ordered. Further recommendations pending results of the exam.  ? ?    ?    ? ?Lavena Bullion ,DO, FACG 12/22/2021, 10:04 AM ? ?

## 2021-12-23 ENCOUNTER — Other Ambulatory Visit: Payer: Self-pay

## 2021-12-23 DIAGNOSIS — C178 Malignant neoplasm of overlapping sites of small intestine: Secondary | ICD-10-CM

## 2021-12-23 MED ORDER — PANCRELIPASE (LIP-PROT-AMYL) 36000-114000 UNITS PO CPEP: 36000.0000 [IU] | ORAL_CAPSULE | Freq: Three times a day (TID) | ORAL | 11 refills | Status: DC

## 2022-01-01 ENCOUNTER — Encounter: Payer: Self-pay | Admitting: *Deleted

## 2022-01-02 ENCOUNTER — Ambulatory Visit (AMBULATORY_SURGERY_CENTER): Payer: Managed Care, Other (non HMO) | Admitting: Gastroenterology

## 2022-01-02 ENCOUNTER — Encounter: Payer: Self-pay | Admitting: Gastroenterology

## 2022-01-02 VITALS — BP 141/85 | HR 56 | Temp 98.2°F | Resp 12 | Ht 66.0 in | Wt 123.0 lb

## 2022-01-02 DIAGNOSIS — K9189 Other postprocedural complications and disorders of digestive system: Secondary | ICD-10-CM | POA: Diagnosis not present

## 2022-01-02 DIAGNOSIS — Z85068 Personal history of other malignant neoplasm of small intestine: Secondary | ICD-10-CM

## 2022-01-02 DIAGNOSIS — Z86010 Personal history of colonic polyps: Secondary | ICD-10-CM | POA: Diagnosis not present

## 2022-01-02 DIAGNOSIS — R9389 Abnormal findings on diagnostic imaging of other specified body structures: Secondary | ICD-10-CM

## 2022-01-02 DIAGNOSIS — Z1509 Genetic susceptibility to other malignant neoplasm: Secondary | ICD-10-CM

## 2022-01-02 DIAGNOSIS — Z809 Family history of malignant neoplasm, unspecified: Secondary | ICD-10-CM

## 2022-01-02 DIAGNOSIS — K319 Disease of stomach and duodenum, unspecified: Secondary | ICD-10-CM | POA: Diagnosis not present

## 2022-01-02 DIAGNOSIS — Z90410 Acquired total absence of pancreas: Secondary | ICD-10-CM

## 2022-01-02 DIAGNOSIS — K5289 Other specified noninfective gastroenteritis and colitis: Secondary | ICD-10-CM

## 2022-01-02 DIAGNOSIS — Z1211 Encounter for screening for malignant neoplasm of colon: Secondary | ICD-10-CM

## 2022-01-02 DIAGNOSIS — Z85028 Personal history of other malignant neoplasm of stomach: Secondary | ICD-10-CM

## 2022-01-02 MED ORDER — SODIUM CHLORIDE 0.9 % IV SOLN: 500.0000 mL | Freq: Once | INTRAVENOUS | Status: DC

## 2022-01-02 NOTE — Progress Notes (Signed)
Called to room to assist during endoscopic procedure.  Patient ID and intended procedure confirmed with present staff. Received instructions for my participation in the procedure from the performing physician.  

## 2022-01-02 NOTE — Op Note (Signed)
[facility] ?Patient Name: Keith [name] ?Procedure Date: 01/02/2022 1:54 PM ?MRN: [id number] ?Endoscopist: Gerrit Heck , MD ?Age: 51 ?Referring MD:  ?Date of Birth: 01-07-1971 ?Gender: Male ?Account #: [id number] ?Procedure:                Colonoscopy ?Indications:              Family history of hereditary nonpolyposis  ?                          colorectal cancer, Abnormal CT of the GI tract,  ?                          Elevated CEA. Lynch Syndrome ?                          51 yo male with Lynch syndrome, history of stage  ?                          IIIc adenocarcinoma of the small bowel (treated  ?                          2015 with chemotherapy and XRT and Whipple surgical  ?                          resection with 9+ lymph nodes; completed  ?                          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                          treated with surgery and 2 cycles of chemotherapy  ?                          but stopped due to hepatotoxicity), superficial  ?                          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                          08/28/2020. ?                          CT in 11/2021 with Whipple anatomy, atrophic  ?                          remaining pancreatic body and tail without duct  ?                          dilation. Normal liver  with choledochojejunostomy.  ?                          Large stool burden in rectum. Area of under  ?                          distention and wall thickening in the rectosigmoid  ?                          junction ?                          CEA (6.33) ?Medicines:                Monitored Anesthesia Care ?Procedure:                Pre-Anesthesia Assessment: ?                          - Prior to the procedure, a History and Physical  ?                          was performed,  and patient medications and  ?                          allergies were reviewed. The patient's tolerance of  ?                          previous anesthesia was also reviewed. The risks  ?                          and benefits of the procedure and the sedation  ?                          options and risks were discussed with the patient.  ?                          All questions were answered, and informed consent  ?                          was obtained. Prior Anticoagulants: The patient has  ?                          taken no previous anticoagulant or antiplatelet  ?                          agents. ASA Grade Assessment: II - A patient with  ?                          mild systemic disease. After reviewing the risks  ?                          and benefits, the patient was deemed in  ?  satisfactory condition to undergo the procedure. ?                          After obtaining informed consent, the colonoscope  ?                          was passed under direct vision. Throughout the  ?                          procedure, the patient's blood pressure, pulse, and  ?                          oxygen saturations were monitored continuously. The  ?                          CF [id number] #[id number] was introduced through the anus  ?                          and advanced to the the terminal ileum. The  ?                          colonoscopy was technically difficult and complex  ?                          due to a tortuous colon. The patient tolerated the  ?                          procedure well. The quality of the bowel  ?                          preparation was good. The terminal ileum, ileocecal  ?                          valve, appendiceal orifice, and rectum were  ?                          photographed. ?Scope In: 2:26:46 PM ?Scope Out: 2:45:11 PM ?Scope Withdrawal Time: 0 hours 10 minutes 14 seconds  ?Total Procedure Duration: 0 hours 18 minutes 25 seconds  ?Findings:                 The  perianal and digital rectal examinations were  ?                          normal. ?                          There was evidence of a prior end-to-end  ?                          colo-colonic anastomosis in the proximal sigmoid  ?                          colon, located 45 cm from the anal verge. This was  ?  patent and was characterized by healthy appearing  ?                          mucosa. The anastomosis was traversed. ?                          Normal mucosa was found in the entire colon. ?                          The sigmoid colon was significantly tortuous. ?                          The retroflexed view of the distal rectum and anal  ?                          verge was normal and showed no anal or rectal  ?                          abnormalities. ?                          The terminal ileum appeared normal. ?Complications:            No immediate complications. ?Estimated Blood Loss:     Estimated blood loss: none. ?Impression:               - Patent end-to-end colo-colonic anastomosis,  ?                          characterized by healthy appearing mucosa. ?                          - Normal mucosa in the entire examined colon. ?                          - Tortuous colon. ?                          - The distal rectum and anal verge are normal on  ?                          retroflexion view. ?                          - The examined portion of the ileum was normal. ?                          - No specimens collected. ?Recommendation:           - Patient has a contact number available for  ?                          emergencies. The signs and symptoms of potential  ?                          delayed complications were discussed with the  ?  patient. Return to normal activities tomorrow.  ?                          Written discharge instructions were provided to the  ?                          patient. ?                          - Resume previous diet. ?                           - Continue present medications. ?                          - Repeat colonoscopy in 2 years for screening  ?                          purposes. Plan to use smaller caliber ultrathin  ?                          scope. ?                          - Return to GI office at appointment to be  ?                          scheduled. ?Gerrit Heck, MD ?01/02/2022 3:08:55 PM ?

## 2022-01-02 NOTE — Progress Notes (Signed)
VS completed by CW.   Pt's states no medical or surgical changes since previsit or office visit.  

## 2022-01-02 NOTE — Progress Notes (Signed)
Report to PACU, RN, vss, BBS= Clear.  

## 2022-01-02 NOTE — Progress Notes (Signed)
? ?GASTROENTEROLOGY PROCEDURE H&P NOTE  ? ?Primary Care Physician: ?Martinique, Betty G, MD ? ? ? ?Reason for Procedure:   Lynch syndrome, elevated CEA, abnormal CT ? ?Plan:    EGD, colonoscopy ? ?Patient is appropriate for endoscopic procedure(s) in the ambulatory ([place]) setting. ? ?The nature of the procedure, as well as the risks, benefits, and alternatives were carefully and thoroughly reviewed with the patient. Ample time for discussion and questions allowed. The patient understood, was satisfied, and agreed to proceed.  ? ? ? ?HPI: ?Keith [name] is a 51 y.o. male who presents for EGD and colonoscopy for evaluation of abnormal CT, Lynch Syndrome screening, and elevated CEA.  Patient was most recently seen in the Gastroenterology Clinic on 12/22/2021 by me.  No interval change in medical history since that appointment. Please refer to that note for full details regarding GI history and clinical presentation.  ? ?FHx: ?- Father: Esophageal cancer ?- Mother: Ureteral CA ?- Sister: Ureteral CA ?- Sister: ?Stomach CA vs Colon cancer (died age 80) ?- Cousin: Colon CA ?- Paternal uncle: Colon cancer at age 42 ?  ?-06/10/2021: CT A/P: Whipple anatomy, normal liver without duct dilation.  Normal spleen.  Stable mild wall thickening involving residual stomach, otherwise normal GI tract. ?-11/21/2021: CT A/P: Whipple anatomy, atrophic remaining pancreatic body and tail without duct dilation.  Normal liver with choledochojejunostomy.  Large stool burden in rectum.  Area of under distention and wall thickening in the rectosigmoid junction ? ?Endoscopic History: ?- EGD (07/2020): Whipple anatomy, 10 mm marginal ulcer (path: Adenocarcinoma with signet ring features), mild gastritis.  Normal small bowel ?- Colonoscopy (07/2020): 3 subcentimeter polyps (path: Tubular adenomas), Tortuous colon, internal hemorrhoids.  Normal TI.  Repeat in 3 years ? ?Past Medical History:  ?Diagnosis Date  ? Acute thrombosis of cephalic vein, right  12/24/6220  ? Blood transfusion without reported diagnosis   ? 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 Cancer of small intestine, including duodenum ([place]) 10/26/2013  ? small intestine adenocarcinoma stage IIIc   ? CHF (congestive heart failure) ([place])   ? patient denies  ? Chronic hepatitis B ([place])   ? Diabetes mellitus without complication ([place])   ? patient denies  ? Family history of cancer of genitourinary system   ? Family history of esophageal cancer   ? GERD (gastroesophageal reflux disease)   ? remote history  ? Hydronephrosis of left kidney   ? Hypertension   ? no medications at this time  ? Impaired fasting glucose   ? Lung nodules   ? 2 mm nodule in the posterior left lower lobe (4/115), stable. 4 mm ground-glass nodule in the apical segment right upper lobe (4/30), unchanged as well.  ? Platelets decreased ([place])   ? Radiation 01/31/14-03/06/14  ? upper abdomen/surgical bed  ? Urothelial cancer ([place]) 12/20/2019  ? ? ?Past Surgical History:  ?Procedure Laterality Date  ? ABDOMINAL EXPLORATION SURGERY  09/19/2013  ? done in [place]  ? APPENDECTOMY  1997  ? BACK SURGERY    ? tumor excision on back  ? COLONOSCOPY  2015  ? CYSTOSCOPY WITH RETROGRADE PYELOGRAM, URETEROSCOPY AND STENT PLACEMENT Left 10/26/2019  ? Procedure: CYSTOSCOPY WITH LEFT RETROGRADE PYELOGRAM, URETEROSCOPY;  Surgeon: Franchot Gallo, MD;  Location: [facility];  Service: Urology;  Laterality: Left;  1 HR  ? PANCREATICODUODENECTOMY  09/19/2013  ? PARTIAL GASTRECTOMY  09/19/2013  ? UPPER GI ENDOSCOPY    ? XI ROBOTICALLY ASSISTED LAPAROSCOPIC URETERAL RE-IMPLANTATION   12/06/2019  ? ? ?  Prior to Admission medications   ?Medication Sig Start Date End Date Taking? Authorizing Provider  ?carboxymethylcellulose (REFRESH PLUS) 0.5 % SOLN Place 1 drop into both eyes 3 (three) times daily as needed (dry/irritated eyes.).    Yes [provider]  ?co-enzyme Q-10 30 MG capsule Take 30 mg by mouth as needed. 08/07/2020 Takes 2 times/weekly.   Yes  [provider]  ?Cyanocobalamin (VITAMIN B-12 PO) Take by mouth as needed.   Yes [provider]  ?lipase/protease/amylase (CREON) 36000 UNITS CPEP capsule Take 1 capsule (36,000 Units total) by mouth 3 (three) times daily with meals. 12/23/21  Yes Ennever, Rudell Cobb, MD  ?Vitamin D, Ergocalciferol, (DRISDOL) 1.25 MG (50000 UNIT) CAPS capsule Take 1 tablet by mouth daily.   Yes [provider]  ? ? ?Current Outpatient Medications  ?Medication Sig Dispense Refill  ? carboxymethylcellulose (REFRESH PLUS) 0.5 % SOLN Place 1 drop into both eyes 3 (three) times daily as needed (dry/irritated eyes.).     ? co-enzyme Q-10 30 MG capsule Take 30 mg by mouth as needed. 08/07/2020 Takes 2 times/weekly.    ? Cyanocobalamin (VITAMIN B-12 PO) Take by mouth as needed.    ? lipase/protease/amylase (CREON) 36000 UNITS CPEP capsule Take 1 capsule (36,000 Units total) by mouth 3 (three) times daily with meals. 90 capsule 11  ? Vitamin D, Ergocalciferol, (DRISDOL) 1.25 MG (50000 UNIT) CAPS capsule Take 1 tablet by mouth daily.    ? ?Current Facility-Administered Medications  ?Medication Dose Route Frequency Provider Last Rate Last Admin  ? 0.9 %  sodium chloride infusion  500 mL Intravenous Once Daris Harkins V, DO      ? ? ?Allergies as of 01/02/2022  ? (No Known Allergies)  ? ? ?Family History  ?Problem Relation Age of Onset  ? Cancer Mother 42  ?     Ureter  ? Hypertension Other   ? Colon cancer Sister 30  ? Esophageal cancer Father 78  ? Colon cancer Cousin 38  ?     paternal first cousin  ? Cancer Sister 33  ?     Ureter  ? Rectal cancer Neg Hx   ? Stomach cancer Neg Hx   ? ? ?Social History  ? ?Socioeconomic History  ? Marital status: Married  ?  Spouse name: Keith [name]   ? Number of children: 2  ? Years of education: 22  ? Highest education level: Not on file  ?Occupational History  ? Occupation: TECHNICAL SUPPORT   ?  Employer: [employer]  ?  Comment: [employer]   ?Tobacco Use  ? Smoking status:  Former  ?  Packs/day: 0.25  ?  Years: 25.00  ?  Pack years: 6.25  ?  Types: Cigarettes  ?  Start date: 09/07/1986  ?  Quit date: 09/13/2013  ?  Years since quitting: 8.3  ? Smokeless tobacco: Never  ? Tobacco comments:  ?  quit January of 2015  ?Vaping Use  ? Vaping Use: Never used  ?Substance and Sexual Activity  ? Alcohol use: No  ?  Alcohol/week: 0.0 standard drinks  ?  Comment: He drinks rarely   ? Drug use: No  ? Sexual activity: Yes  ?  Birth control/protection: Surgical  ?  Comment: Wife had a BTL  ?Other Topics Concern  ? Not on file  ?Social History Narrative  ? Marital Status: Married Keith [name])  ? Children:  Son Keith [name]), Daughter Keith [name])   ? Pets: None  ? Living Situation: Lives  with spouse, son and daughter   ? Occupation: Dance movement psychotherapist Manufacturing engineer)  ? Education:  Curator)   ? Tobacco Use/Exposure:  He smokes 4-5 cigs per day.  He has smoked at least 20 years.    ? Alcohol Use:  Rarely   ? Exercise:  Limited   ? Hobbies: Music  ?   ?   ?   ? ?Social Determinants of Health  ? ?Financial Resource Strain: Not on file  ?Food Insecurity: Not on file  ?Transportation Needs: Not on file  ?Physical Activity: Not on file  ?Stress: Not on file  ?Social Connections: Not on file  ?Intimate Partner Violence: Not on file  ? ? ?Physical Exam: ?Vital signs in last 24 hours: ?'@BP'$  134/88   Pulse 69   Temp 98.2 ?F (36.8 ?C) (Temporal)   Resp 20   Ht '5\' 6"'$  (1.676 m)   Wt 123 lb (55.8 kg)   SpO2 100%   BMI 19.85 kg/m?  ?GEN: NAD ?EYE: Sclerae anicteric ?ENT: MMM ?CV: Non-tachycardic ?Pulm: CTA b/l ?GI: Soft, NT/ND ?NEURO:  Alert & Oriented x 3 ? ? ?Gerrit Heck, DO ?[facility] ? ? ?01/02/2022 2:08 PM ? ?

## 2022-01-02 NOTE — Op Note (Signed)
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                          Personal history of malignant gastric neoplasm,  ?                          Personal history of malignant neoplasm of the GI  ?                          tract. Area of under distention and wall thickening  ?                          in the rectosigmoid junction ?                          51 yo male with Lynch syndrome, history of stage  ?                          IIIc adenocarcinoma of the small bowel (treated  ?                          2015 with chemotherapy and XRT and Whipple??surgical  ?                          resection with 9+ lymph nodes; completed  ?                          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                          treated with??surgery and??2??cycles of chemotherapy  ?                          but stopped due to hepatotoxicity), superficial  ?                          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  08/28/2020. ?                          CT in 11/2021 with Whipple anatomy, atrophic  ?                          remaining pancreatic body and tail without duct  ?                          dilation. Normal liver with choledochojejunostomy.  ?                          Large stool burden in rectum. Area of under  ?                          distention and wall thickening in the rectosigmoid  ?                          junction ?                           CEA (6.33) ?Medicines:                Monitored Anesthesia Care ?Procedure:                Pre-Anesthesia Assessment: ?                          - Prior to the procedure, a History and Physical  ?                          was performed, and patient medications and  ?                          allergies were reviewed. The patient's tolerance of  ?                          previous anesthesia was also reviewed. The risks  ?                          and benefits of the procedure and the sedation  ?                          options and risks were discussed with the patient.  ?                          All questions were answered, and informed consent  ?                          was obtained. Prior Anticoagulants: The patient has  ?                          taken no previous anticoagulant or antiplatelet  ?                          agents. ASA Grade  Assessment: II - A patient with  ?                          mild systemic disease. After reviewing the risks  ?                          and benefits, the patient was deemed in  ?                          satisfactory condition to undergo the procedure. ?                          After obtaining informed consent, the endoscope was  ?                          passed under direct vision. Throughout the  ?                          procedure, the patient's blood pressure, pulse, and  ?                          oxygen saturations were monitored continuously. The  ?                          Endoscope was introduced through the mouth, and  ?                          advanced to the jejunum. The upper GI endoscopy was  ?                          accomplished without difficulty. The patient  ?                          tolerated the procedure well. ?Scope In: ?Scope Out: ?Findings:                 The examined esophagus was normal. ?                          Evidence of a classic Whipple bypass anatomy was  ?                          found in the gastric body. This was  characterized  ?                          by edema and erythema. This was predominantly small  ?                          bowel that was noted to retropulse into the gastric  ?                          vault several times during the case. Biopsies were  ?  taken with a cold forceps for histology. Estimated  ?                          blood loss was minimal. There was also scarring in  ?                          the gastric body from the prior resection. This  ?                          mucosa appeared normal. Otherwise, normal fundus  ?                          and gastric cardia. ?                          Localized mildly erythematous mucosa without active  ?                          bleeding and with no stigmata of bleeding was found  ?                          in the proximal small bowel. Biopsies were taken  ?                          with a cold forceps for histology. Estimated blood  ?                          loss was minimal. The remainder of the small bowel  ?                          was normal appearing, including jejuno-jejunal  ?                          anastamosis. ?Complications:            No immediate complications. ?Estimated Blood Loss:     Estimated blood loss was minimal. ?Impression:               - Normal esophagus. ?                          - A classic Whipple was found, characterized by  ?                          edema and erythema. Biopsied. ?                          - Erythematous (hyperemic) jejunal mucosa. Biopsied. ?Recommendation:           - Patient has a contact number available for  ?                          emergencies. The signs and symptoms of potential  ?                          delayed complications  were discussed with the  ?                          patient. Return to normal activities tomorrow.  ?                          Written discharge instructions were provided to the  ?                          patient. ?                          - Resume  previous diet. ?                          - Continue present medications. ?                          - Await pathology results. ?                          - Repeat upper endoscopy in 3 years for  ?                          surveillance. ?                          - Perform a colonoscopy today. ?Gerrit Heck, MD ?01/02/2022 3:04:22 PM ?

## 2022-01-02 NOTE — Patient Instructions (Signed)
YOU HAD AN ENDOSCOPIC PROCEDURE TODAY: Refer to the procedure report and other information in the discharge instructions given to you for any specific questions about what was found during the examination. If this information does not answer your questions, please call [place] office at [phone] to clarify.  ° °YOU SHOULD EXPECT: Some feelings of bloating in the abdomen. Passage of more gas than usual. Walking can help get rid of the air that was put into your GI tract during the procedure and reduce the bloating. If you had a lower endoscopy (such as a colonoscopy or flexible sigmoidoscopy) you may notice spotting of blood in your stool or on the toilet paper. Some abdominal soreness may be present for a day or two, also. ° °DIET: Your first meal following the procedure should be a light meal and then it is ok to progress to your normal diet. A half-sandwich or bowl of soup is an example of a good first meal. Heavy or fried foods are harder to digest and may make you feel nauseous or bloated. Drink plenty of fluids but you should avoid alcoholic beverages for 24 hours. If you had a esophageal dilation, please see attached instructions for diet.   ° °ACTIVITY: Your care partner should take you home directly after the procedure. You should plan to take it easy, moving slowly for the rest of the day. You can resume normal activity the day after the procedure however YOU SHOULD NOT DRIVE, use power tools, machinery or perform tasks that involve climbing or major physical exertion for 24 hours (because of the sedation medicines used during the test).  ° °SYMPTOMS TO REPORT IMMEDIATELY: °A gastroenterologist can be reached at any hour. Please call [phone]  for any of the following symptoms:  °Following lower endoscopy (colonoscopy, flexible sigmoidoscopy) °Excessive amounts of blood in the stool  °Significant tenderness, worsening of abdominal pains  °Swelling of the abdomen that is new, acute  °Fever of 100° or  higher  °Following upper endoscopy (EGD, EUS, ERCP, esophageal dilation) °Vomiting of blood or coffee ground material  °New, significant abdominal pain  °New, significant chest pain or pain under the shoulder blades  °Painful or persistently difficult swallowing  °New shortness of breath  °Black, tarry-looking or red, bloody stools ° °FOLLOW UP:  °If any biopsies were taken you will be contacted by phone or by letter within the next 1-3 weeks. Call [phone]  if you have not heard about the biopsies in 3 weeks.  °Please also call with any specific questions about appointments or follow up tests. ° °

## 2022-01-06 ENCOUNTER — Telehealth: Payer: Self-pay

## 2022-01-06 ENCOUNTER — Other Ambulatory Visit: Payer: Self-pay | Admitting: Pharmacist

## 2022-01-06 NOTE — Telephone Encounter (Signed)
?  Follow up Call- ? ? ?  01/02/2022  ?  1:42 PM 07/23/2020  ?  7:49 AM  ?Call back number  ?Post procedure Call Back phone  # [phone]  ?Permission to leave phone message Yes Yes  ?  ? ?Patient questions: ? ?Do you have a fever, pain , or abdominal swelling? No. ?Pain Score  0 * ? ?Have you tolerated food without any problems? Yes.   ? ?Have you been able to return to your normal activities? Yes.   ? ?Do you have any questions about your discharge instructions: ?Diet   No. ?Medications  No. ?Follow up visit  No. ? ?Do you have questions or concerns about your Care? No. ? ?Actions: ?* If pain score is 4 or above: ?No action needed, pain <4. ? ? ? ?

## 2022-01-12 ENCOUNTER — Encounter: Payer: Self-pay | Admitting: Gastroenterology

## 2022-01-12 ENCOUNTER — Ambulatory Visit (INDEPENDENT_AMBULATORY_CARE_PROVIDER_SITE_OTHER): Payer: Managed Care, Other (non HMO) | Admitting: Gastroenterology

## 2022-01-12 ENCOUNTER — Other Ambulatory Visit (INDEPENDENT_AMBULATORY_CARE_PROVIDER_SITE_OTHER): Payer: Managed Care, Other (non HMO)

## 2022-01-12 VITALS — BP 100/66 | HR 68 | Ht 66.0 in | Wt 122.4 lb

## 2022-01-12 DIAGNOSIS — R748 Abnormal levels of other serum enzymes: Secondary | ICD-10-CM | POA: Diagnosis not present

## 2022-01-12 DIAGNOSIS — Z90410 Acquired total absence of pancreas: Secondary | ICD-10-CM

## 2022-01-12 DIAGNOSIS — Z85068 Personal history of other malignant neoplasm of small intestine: Secondary | ICD-10-CM | POA: Diagnosis not present

## 2022-01-12 DIAGNOSIS — Z85028 Personal history of other malignant neoplasm of stomach: Secondary | ICD-10-CM

## 2022-01-12 DIAGNOSIS — Z809 Family history of malignant neoplasm, unspecified: Secondary | ICD-10-CM

## 2022-01-12 DIAGNOSIS — Z1509 Genetic susceptibility to other malignant neoplasm: Secondary | ICD-10-CM

## 2022-01-12 DIAGNOSIS — Z9049 Acquired absence of other specified parts of digestive tract: Secondary | ICD-10-CM

## 2022-01-12 LAB — HEPATIC FUNCTION PANEL
ALT: 15 U/L (ref 0–53)
AST: 18 U/L (ref 0–37)
Albumin: 4.2 g/dL (ref 3.5–5.2)
Alkaline Phosphatase: 120 U/L — ABNORMAL HIGH (ref 39–117)
Bilirubin, Direct: 0.2 mg/dL (ref 0.0–0.3)
Total Bilirubin: 0.7 mg/dL (ref 0.2–1.2)
Total Protein: 6.6 g/dL (ref 6.0–8.3)

## 2022-01-12 LAB — GAMMA GT: GGT: 12 U/L (ref 7–51)

## 2022-01-12 NOTE — Progress Notes (Signed)
? ?Chief Complaint:    Lynch Syndrome, procedure follow-up ? 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 ?  ?Evaluated in the genetics clinic and diagnosed with MSH2-related Lynch Syndrome (HNPCC 1) ?  ?Initially seen in the GI clinic in 06/2020 for evaluation of abdominal pain, and subsequent diagnosed with gastric signet carcinoma with EGD as above. ? ?-06/10/2021: CT A/P: Whipple anatomy, normal liver without duct dilation.  Normal spleen.  Stable mild wall thickening involving residual stomach, otherwise normal GI tract. ?-11/21/2021: CT A/P: Whipple anatomy, atrophic remaining pancreatic body and tail without duct dilation.  Normal liver with choledochojejunostomy.  Large stool burden in rectum. ? ?-12/22/2021: Follow-up in the GI clinic.  CEA 6.33.  Otherwise no symptoms ?- 01/02/2022: Completed EGD and colonoscopy as below ?  ?  ?FHx: ?- Father: Esophageal cancer ?- Mother: Ureteral CA ?- Sister: Ureteral CA ?- Sister: ?Stomach CA vs Colon cancer (died age 73) ?- Cousin: Colon CA ?- Paternal uncle: Colon cancer at age 47 ?  ?Endoscopic History: ?- EGD (07/2020): Whipple anatomy, 10 mm marginal ulcer (path: Adenocarcinoma with signet ring features), mild gastritis.  Normal small bowel ?- Colonoscopy (07/2020): 3 subcentimeter polyps (path: Tubular adenomas), Tortuous colon, internal hemorrhoids.  Normal TI.  Repeat in 3 years ?- EGD (12/2021):Evidence of prior Whipple.  Edema and erythema at anastomosis with retropulsed vein small bowel into the gastric  folds.  Biopsies with benign inflammatory change and no dysplasia or malignancy.  Remainder of the small bowel normal including visualization of the jejunojejunal anastomosis.  Otherwise normal gastric pouch.  Repeat upper endoscopy in 3 years for surveillance ?- Colonoscopy (12/2021):Healthy-appearing colocolonic anastomosis at 45 cm.  Otherwise normal colon.  Significantly tortuous sigmoid.  Normal TI.  Repeat in 2 years for ongoing screening with plan for ultrathin colonoscope ? ?HPI:   ? ? ?Patient is a 51 y.o. male presenting to the Gastroenterology Clinic for procedure follow-up.  Was last seen by me in the office on 12/22/2021 for evaluation of abnormal imaging, elevated CEA, and endoscopic evaluation for known Lynch syndrome.  Since completed upper endoscopy and colonoscopy as outlined above. ? ?Today, he states he feels well.  Good p.o. intake.  Offers no complaints today. ? ?Otherwise, no new labs or abdominal imaging for review today.   ? ? ?Review of systems:     No chest pain, no SOB, no fevers, no urinary sx  ? ?Past Medical History:  ?Diagnosis Date  ? Acute thrombosis of cephalic vein, right 9/73/5329  ? Blood transfusion without reported diagnosis   ? 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 Cancer of small intestine, including duodenum ([place]) 10/26/2013  ? small intestine adenocarcinoma stage IIIc   ? CHF (congestive heart failure) ([place])   ? patient denies  ? Chronic hepatitis B ([place])   ? Diabetes mellitus without complication ([place])   ? patient denies  ? Family history of cancer of genitourinary system   ? Family history of esophageal cancer   ? GERD (gastroesophageal reflux disease)   ? remote history  ? Hydronephrosis of left kidney   ?  Hypertension   ? no medications at this time  ? Impaired fasting glucose   ? Lung nodules   ? 2 mm nodule in the posterior left lower lobe (4/115), stable. 4 mm ground-glass nodule in the apical segment right upper lobe (4/30), unchanged as well.  ? Platelets  decreased ([place])   ? Radiation 01/31/14-03/06/14  ? upper abdomen/surgical bed  ? Urothelial cancer ([place]) 12/20/2019  ? ? ?Patient's surgical history, family medical history, social history, medications and allergies were all reviewed in Epic  ? ? ?Current Outpatient Medications  ?Medication Sig Dispense Refill  ? carboxymethylcellulose (REFRESH PLUS) 0.5 % SOLN Place 1 drop into both eyes 3 (three) times daily as needed (dry/irritated eyes.).     ? co-enzyme Q-10 30 MG capsule Take 30 mg by mouth as needed. 08/07/2020 Takes 2 times/weekly.    ? Cyanocobalamin (VITAMIN B-12 PO) Take by mouth as needed.    ? lipase/protease/amylase (CREON) 36000 UNITS CPEP capsule Take 1 capsule (36,000 Units total) by mouth 3 (three) times daily with meals. 90 capsule 11  ? Vitamin D, Ergocalciferol, (DRISDOL) 1.25 MG (50000 UNIT) CAPS capsule Take 1 tablet by mouth daily.    ? ?No current facility-administered medications for this visit.  ? ? ?Physical Exam:   ? ? ?BP 100/66   Pulse 68   Ht '5\' 6"'  (1.676 m)   Wt 122 lb 6 oz (55.5 kg)   BMI 19.75 kg/m?  ? ?GENERAL:  Pleasant male in NAD ?PSYCH: : Cooperative, normal affect ?Musculoskeletal:  Normal muscle tone, normal strength ?NEURO: Alert and oriented x 3, no focal neurologic deficits ? ? ?IMPRESSION and PLAN:   ? ?1) Lynch Syndrome ?2) History of gastric cancer s/p resection ?3) History of small bowel cancer s/p Whipple ?4) History of ureteral cancer ?5) Family history of multiple GI cancers ?6) Elevated alkaline phosphatase ?  ?- Colonoscopy every 1-2 years ?- Recommend starting daily aspirin for at least 2 years.  ?- Repeat EGD every 3 years ?- No known family history of Pancreatic Cancer.  Per [place] and AGA guidelines, do not need to initiate specific PC screening protocol (remaining pancreas visualized on recent CT) ?-Recent labs with mildly elevated ALP.  Repeat liver enzymes along with alk phos fractionization and GGT ?- Surveillance of small bowel can be difficult  in the postoperative anatomy.  Instead of VCE, recommend periodic CT enterography or MR enterography (preferred to try to limit some of radiation exposure) every 3-5 years ?- Continue follow-up in the Hematology/Oncology clinic ? ?RTC 1 year sooner as needed ? ?I spent 30 minutes of time, including in depth chart review, independent review of results as outlined above, communicating results with the patient directly, face-to-face time with the patient, coordinating care, and ordering studies and medications as appropriate, and documentation. ?   ?    ? ?Lavena Bullion ,DO, FACG 01/12/2022, 8:59 AM ? ?

## 2022-01-12 NOTE — Patient Instructions (Addendum)
If you are age 53 or younger, your body mass index should be between 19-25. Your There is no height or weight on file to calculate BMI. If this is out of the aformentioned range listed, please consider follow up with your Primary Care Provider.  ? ?__________________________________________________________ ? ?The [facility] providers would like to encourage you to use [facility] to communicate with providers for non-urgent requests or questions.  Due to long hold times on the telephone, sending your provider a message by [facility] may be a faster and more efficient way to get a response.  Please allow 48 business hours for a response.  Please remember that this is for non-urgent requests.   ? ?Due to recent changes in healthcare laws, you may see the results of your imaging and laboratory studies on MyChart before your provider has had a chance to review them.  We understand that in some cases there may be results that are confusing or concerning to you. Not all laboratory results come back in the same time frame and the provider may be waiting for multiple results in order to interpret others.  Please give [place] 48 hours in order for your provider to thoroughly review all the results before contacting the office for clarification of your results.  ? ?Please go to the lab on the 2nd floor [address] before you leave the office today.   ? ?Please purchase the following medications over the counter and take as directed: Aspirin 81 mg daily. ? ?Please repeat colonoscopy in 2 years, and EGD in 3 years. ? ?Follow up in 1 year. ? ?Thank you for choosing me and [facility]. ? ?Gerrit Heck, D.O. ? ? ? ?We want to thank you for trusting [facility] with your care. All of our staff and providers value the relationships we have built with our patients, and it is an honor to care for you.  ? ?We are writing to let you know that [facility] will close on Jan 19, 2022, and  we invite you to continue to see Dr. Carmell Austria and Gerrit Heck at the [facility] [place] office location. We are consolidating our serices at these [facility] practices to better provide care. Our office staff will work with you to ensure a seamless transition.  ? ?Gerrit Heck, DO -Dr. Bryan Lemma will be movig to [facility] at [address], [place], [place] [address], effective Jan 19, 2022.  Contact [phone] to schedule an appointment with him.  ? ?Carmell Austria, MD- Dr. Lyndel Safe will be movig to [facility] at [address] [place], [place] [address], effective Jan 19, 2022.  Contact [phone] to schedule an appointment with him.  ? ?Requesting Medical Records ?If you need to request your medical records, please follow the instructions below. Your medical records are confidential, and a copy can be transferred to another provider or released to you or another person you designate only with your permission. ? ?There are several ways to request your medical records: ?Requests for medical records can be submitted through our practice.   ?You can also request your records electronically, in your MyChart account by selecting the ?Request Health Records? tab.  ?If you need additional information on how to request records, please go to [email or web address]/, choose Patient Information, then select Request Medical Records. ?To make an appointment or if you have any questions about your health care needs, please contact our office at [phone] and one of our staff  members will be glad to assist you. ?[facility] is committed to providing exceptional care for you and our community. Thank you for allowing [place] to serve your health care needs. ?Sincerely, ? ?Windy Canny, Director [facility] ?[facility] also offers convenient virtual care options. Sore throat? Sinus problems? Cold or flu symptoms? Get care from the comfort of home with [facility] Video  Visits and e-Visits. Learn more about the non-emergency conditions treated and start your virtual visit at [email or web address] ? ?

## 2022-01-15 LAB — ALKALINE PHOSPHATASE, ISOENZYMES
Alkaline Phosphatase: 151 IU/L — ABNORMAL HIGH (ref 44–121)
BONE FRACTION: 61 % (ref 12–68)
INTESTINAL FRAC.: 2 % (ref 0–18)
LIVER FRACTION: 37 % (ref 13–88)

## 2022-01-19 ENCOUNTER — Inpatient Hospital Stay: Payer: Managed Care, Other (non HMO) | Admitting: Family

## 2022-01-19 ENCOUNTER — Encounter: Payer: Self-pay | Admitting: Family

## 2022-01-19 ENCOUNTER — Inpatient Hospital Stay: Payer: Managed Care, Other (non HMO) | Attending: Hematology & Oncology

## 2022-01-19 VITALS — BP 136/83 | HR 59 | Temp 97.7°F | Resp 20 | Wt 126.8 lb

## 2022-01-19 DIAGNOSIS — Z86718 Personal history of other venous thrombosis and embolism: Secondary | ICD-10-CM | POA: Insufficient documentation

## 2022-01-19 DIAGNOSIS — C162 Malignant neoplasm of body of stomach: Secondary | ICD-10-CM

## 2022-01-19 DIAGNOSIS — Z85068 Personal history of other malignant neoplasm of small intestine: Secondary | ICD-10-CM | POA: Insufficient documentation

## 2022-01-19 DIAGNOSIS — C662 Malignant neoplasm of left ureter: Secondary | ICD-10-CM | POA: Insufficient documentation

## 2022-01-19 DIAGNOSIS — Z9221 Personal history of antineoplastic chemotherapy: Secondary | ICD-10-CM | POA: Diagnosis not present

## 2022-01-19 DIAGNOSIS — C178 Malignant neoplasm of overlapping sites of small intestine: Secondary | ICD-10-CM

## 2022-01-19 DIAGNOSIS — C169 Malignant neoplasm of stomach, unspecified: Secondary | ICD-10-CM | POA: Diagnosis not present

## 2022-01-19 DIAGNOSIS — Z923 Personal history of irradiation: Secondary | ICD-10-CM | POA: Diagnosis not present

## 2022-01-19 DIAGNOSIS — C689 Malignant neoplasm of urinary organ, unspecified: Secondary | ICD-10-CM

## 2022-01-19 DIAGNOSIS — Z1509 Genetic susceptibility to other malignant neoplasm: Secondary | ICD-10-CM | POA: Insufficient documentation

## 2022-01-19 LAB — CBC WITH DIFFERENTIAL (CANCER CENTER ONLY)
Abs Immature Granulocytes: 0.02 10*3/uL (ref 0.00–0.07)
Basophils Absolute: 0 10*3/uL (ref 0.0–0.1)
Basophils Relative: 1 %
Eosinophils Absolute: 0.4 10*3/uL (ref 0.0–0.5)
Eosinophils Relative: 8 %
HCT: 37.6 % — ABNORMAL LOW (ref 39.0–52.0)
Hemoglobin: 12.2 g/dL — ABNORMAL LOW (ref 13.0–17.0)
Immature Granulocytes: 0 %
Lymphocytes Relative: 27 %
Lymphs Abs: 1.5 10*3/uL (ref 0.7–4.0)
MCH: 29.5 pg (ref 26.0–34.0)
MCHC: 32.4 g/dL (ref 30.0–36.0)
MCV: 91 fL (ref 80.0–100.0)
Monocytes Absolute: 0.4 10*3/uL (ref 0.1–1.0)
Monocytes Relative: 8 %
Neutro Abs: 3.1 10*3/uL (ref 1.7–7.7)
Neutrophils Relative %: 56 %
Platelet Count: 125 10*3/uL — ABNORMAL LOW (ref 150–400)
RBC: 4.13 MIL/uL — ABNORMAL LOW (ref 4.22–5.81)
RDW: 13.8 % (ref 11.5–15.5)
WBC Count: 5.5 10*3/uL (ref 4.0–10.5)
nRBC: 0 % (ref 0.0–0.2)

## 2022-01-19 LAB — CMP (CANCER CENTER ONLY)
ALT: 16 U/L (ref 0–44)
AST: 20 U/L (ref 15–41)
Albumin: 4.1 g/dL (ref 3.5–5.0)
Alkaline Phosphatase: 120 U/L (ref 38–126)
Anion gap: 5 (ref 5–15)
BUN: 21 mg/dL — ABNORMAL HIGH (ref 6–20)
CO2: 26 mmol/L (ref 22–32)
Calcium: 9.1 mg/dL (ref 8.9–10.3)
Chloride: 107 mmol/L (ref 98–111)
Creatinine: 1.6 mg/dL — ABNORMAL HIGH (ref 0.61–1.24)
GFR, Estimated: 52 mL/min — ABNORMAL LOW (ref >60)
Glucose, Bld: 161 mg/dL — ABNORMAL HIGH (ref 70–99)
Potassium: 4.5 mmol/L (ref 3.5–5.1)
Sodium: 138 mmol/L (ref 135–145)
Total Bilirubin: 0.7 mg/dL (ref 0.3–1.2)
Total Protein: 6.2 g/dL — ABNORMAL LOW (ref 6.5–8.1)

## 2022-01-19 NOTE — Progress Notes (Signed)
?Hematology and Oncology Follow Up Visit ? ?Keith [name] ?[id number] ?[birth year] 51 y.o. ?01/19/2022 ? ? 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  ?Completed Therapy: ?Status post 3 cycles of CAPOX post XRT/Xeloda - completed in October 2015 ?  ?Current Therapy:        ?CDDP/Gemzar (Adjuvant) - started on 01/10/2020 -- d/c after 2 cycles on 02/02/2020 due to toxicity ?Xarelto 20 mg PO daily -completed in September 2021 ?  ?Interim History:  Keith [name] is here today for follow-up. He was able to see Dr. Bryan Lemma and had repeat EGD and colonoscopy in April. Colonoscopy was negative will repeat in 3 years. EGD biopsies were negative, will repeat in 1-2 years.  ?Last CEA was 6.33 in February.  ?No fever, chills, n/v, cough, rash, dizziness, SOB, chest pain, palpitations, abdominal pain or changes in bowel or bladder habits.  ?No blood loss noted. No bruising or petechiae.  ?No swelling, tenderness, numbness or tingling in his extremities at this time.  ?No falls or syncope.  ?Appetite and hydration are good. His weight is stable at 126 lbs.  ? ?ECOG Performance Status: 1 - Symptomatic but completely ambulatory ? ?Medications:  ?Allergies as of 01/19/2022   ?No Known Allergies ?  ? ?  ?Medication List  ?  ? ?  ? Accurate as of Jan 19, 2022  8:45 AM. If you have any questions, ask your nurse or doctor.  ?  ?  ? ?  ? ?carboxymethylcellulose 0.5 % Soln ?Commonly known as: REFRESH PLUS ?Place 1 drop into both eyes 3 (three) times daily as needed (dry/irritated eyes.). ?  ?co-enzyme Q-10 30 MG capsule ?Take 30 mg by mouth as needed. 08/07/2020 Takes 2 times/weekly. ?  ?lipase/protease/amylase 36000 UNITS Cpep capsule ?Commonly known as: Creon ?Take 1 capsule (36,000 Units total) by  mouth 3 (three) times daily with meals. ?  ?VITAMIN B-12 PO ?Take by mouth daily. ?  ?VITAMIN D (ERGOCALCIFEROL) PO ?Take by mouth daily. 01/19/2022 Patient does not know dose. ?What changed: Another medication with the same name was removed. Continue taking this medication, and follow the directions you see here. ?Changed by: Lottie Dawson, NP ?  ? ?  ? ? ?Allergies: No Known Allergies ? ?Past Medical History, Surgical history, Social history, and Family History were reviewed and updated. ? ?Review of Systems: ?All other 10 point review of systems is negative.  ? ?Physical Exam: ? weight is 126 lb 12.8 oz (57.5 kg). His oral temperature is 97.7 ?F (36.5 ?C). His blood pressure is 136/83 and his pulse is 59 (abnormal). His respiration is 20 and oxygen saturation is 100%.  ? ?Wt Readings from Last 3 Encounters:  ?01/19/22 126 lb 12.8 oz (57.5 kg)  ?01/12/22 122 lb 6 oz (55.5 kg)  ?01/02/22 123 lb (55.8 kg)  ? ? ?Ocular: Sclerae unicteric, pupils equal, round and reactive to light ?Ear-nose-throat: Oropharynx clear, dentition fair ?Lymphatic: No cervical or supraclavicular adenopathy ?Lungs no rales or rhonchi, good excursion bilaterally ?Heart regular rate and rhythm, no murmur appreciated ?Abd soft, nontender, positive bowel sounds ?MSK no focal spinal tenderness, no joint edema ?Neuro: non-focal, well-oriented, appropriate affect ?Breasts: Deferred  ? ?Lab Results  ?Component Value Date  ? WBC 5.5 01/19/2022  ? HGB 12.2 (L) 01/19/2022  ?  HCT 37.6 (L) 01/19/2022  ? MCV 91.0 01/19/2022  ? PLT 125 (L) 01/19/2022  ? ?Lab Results  ?Component Value Date  ? FERRITIN 20 (L) 05/22/2021  ? IRON 71 05/22/2021  ? TIBC 384 05/22/2021  ? UIBC 313 05/22/2021  ? IRONPCTSAT 18 (L) 05/22/2021  ? ?Lab Results  ?Component Value Date  ? RETICCTPCT 1.6 05/22/2021  ? RBC 4.13 (L) 01/19/2022  ? ?No results found for: KPAFRELGTCHN, LAMBDASER, KAPLAMBRATIO ?No results found for: IGGSERUM, IGA, IGMSERUM ?No results found for: TOTALPROTELP,  ALBUMINELP, A1GS, A2GS, BETS, BETA2SER, GAMS, MSPIKE, SPEI ?  Chemistry   ?   ?Component Value Date/Time  ? NA 138 01/19/2022 0811  ? NA 147 (H) 08/06/2017 0816  ? NA 144 02/04/2017 0758  ? K 4.5 01/19/2022 0811  ? K 4.6 08/06/2017 0816  ? K 4.6 02/04/2017 0758  ? CL 107 01/19/2022 0811  ? CL 104 08/06/2017 0816  ? CO2 26 01/19/2022 0811  ? CO2 30 08/06/2017 0816  ? CO2 25 02/04/2017 0758  ? BUN 21 (H) 01/19/2022 0962  ? BUN 12 08/06/2017 0816  ? BUN 14.2 02/04/2017 0758  ? CREATININE 1.60 (H) 01/19/2022 8366  ? CREATININE 0.9 08/06/2017 0816  ? CREATININE 0.9 02/04/2017 0758  ?    ?Component Value Date/Time  ? CALCIUM 9.1 01/19/2022 0811  ? CALCIUM 9.0 08/06/2017 0816  ? CALCIUM 8.9 02/04/2017 0758  ? ALKPHOS 120 01/19/2022 0811  ? ALKPHOS 108 (H) 08/06/2017 0816  ? ALKPHOS 113 02/04/2017 0758  ? AST 20 01/19/2022 0811  ? AST 24 02/04/2017 0758  ? ALT 16 01/19/2022 0811  ? ALT 27 08/06/2017 0816  ? ALT 20 02/04/2017 0758  ? BILITOT 0.7 01/19/2022 0811  ? BILITOT 0.91 02/04/2017 0758  ?  ? ? ? ?Impression and Plan: Mr. Carlyon is 51 yo [place] gentleman returns for a follow up for history of multiple maglinancies in the setting of Lynch syndrome. ?We will continue to follow along closely.   ?We will see him again in July and repeat CT scan at that time.  ? ?Lottie Dawson, NP ?5/15/20238:45 AM ? ?

## 2022-02-23 ENCOUNTER — Telehealth (HOSPITAL_BASED_OUTPATIENT_CLINIC_OR_DEPARTMENT_OTHER): Payer: Self-pay

## 2022-04-24 ENCOUNTER — Ambulatory Visit
Admission: RE | Admit: 2022-04-24 | Discharge: 2022-04-24 | Disposition: A | Discharge status: Home / Self Care | Payer: Managed Care, Other (non HMO) | Source: Ambulatory Visit | Attending: Family | Admitting: Family

## 2022-04-24 DIAGNOSIS — C162 Malignant neoplasm of body of stomach: Secondary | ICD-10-CM

## 2022-04-24 DIAGNOSIS — Z1509 Genetic susceptibility to other malignant neoplasm: Secondary | ICD-10-CM

## 2022-04-24 DIAGNOSIS — C178 Malignant neoplasm of overlapping sites of small intestine: Secondary | ICD-10-CM

## 2022-04-24 MED ORDER — IOPAMIDOL (ISOVUE-300) INJECTION 61%
100.0000 mL | Freq: Once | INTRAVENOUS | Status: AC | PRN
  Administered 2022-04-24: 100 mL via INTRAVENOUS

## 2022-04-28 ENCOUNTER — Telehealth: Payer: Self-pay

## 2022-04-28 NOTE — Telephone Encounter (Signed)
-----   Message from Celso Amy, NP sent at 04/28/2022  1:19 PM EDT ----- CT scan stable. No evidence of recurrent or metastatic disease. He does need a follow-up scheduled please for in 1 month please. Thank you! ----- Message ----- From: Interface, Rad Results In Sent: 04/24/2022   9:07 AM EDT To: Celso Amy, NP

## 2022-05-28 ENCOUNTER — Telehealth: Payer: Self-pay | Admitting: *Deleted

## 2022-05-28 ENCOUNTER — Other Ambulatory Visit: Payer: Self-pay

## 2022-05-28 ENCOUNTER — Inpatient Hospital Stay: Payer: Managed Care, Other (non HMO) | Attending: Hematology & Oncology

## 2022-05-28 ENCOUNTER — Inpatient Hospital Stay (HOSPITAL_BASED_OUTPATIENT_CLINIC_OR_DEPARTMENT_OTHER): Payer: Managed Care, Other (non HMO) | Admitting: Hematology & Oncology

## 2022-05-28 ENCOUNTER — Encounter: Payer: Self-pay | Admitting: Hematology & Oncology

## 2022-05-28 VITALS — BP 131/88 | HR 62 | Temp 98.6°F | Resp 18 | Ht 66.0 in | Wt 126.0 lb

## 2022-05-28 DIAGNOSIS — C169 Malignant neoplasm of stomach, unspecified: Secondary | ICD-10-CM | POA: Diagnosis not present

## 2022-05-28 DIAGNOSIS — C162 Malignant neoplasm of body of stomach: Secondary | ICD-10-CM

## 2022-05-28 DIAGNOSIS — Z85068 Personal history of other malignant neoplasm of small intestine: Secondary | ICD-10-CM | POA: Insufficient documentation

## 2022-05-28 DIAGNOSIS — C178 Malignant neoplasm of overlapping sites of small intestine: Secondary | ICD-10-CM

## 2022-05-28 DIAGNOSIS — Z1509 Genetic susceptibility to other malignant neoplasm: Secondary | ICD-10-CM | POA: Diagnosis not present

## 2022-05-28 DIAGNOSIS — Z923 Personal history of irradiation: Secondary | ICD-10-CM | POA: Diagnosis not present

## 2022-05-28 DIAGNOSIS — Z9221 Personal history of antineoplastic chemotherapy: Secondary | ICD-10-CM | POA: Insufficient documentation

## 2022-05-28 DIAGNOSIS — C662 Malignant neoplasm of left ureter: Secondary | ICD-10-CM | POA: Diagnosis present

## 2022-05-28 DIAGNOSIS — Z86718 Personal history of other venous thrombosis and embolism: Secondary | ICD-10-CM | POA: Diagnosis not present

## 2022-05-28 DIAGNOSIS — C689 Malignant neoplasm of urinary organ, unspecified: Secondary | ICD-10-CM

## 2022-05-28 LAB — CBC WITH DIFFERENTIAL (CANCER CENTER ONLY)
Abs Immature Granulocytes: 0.01 10*3/uL (ref 0.00–0.07)
Basophils Absolute: 0 10*3/uL (ref 0.0–0.1)
Basophils Relative: 1 %
Eosinophils Absolute: 0.3 10*3/uL (ref 0.0–0.5)
Eosinophils Relative: 8 %
HCT: 39.5 % (ref 39.0–52.0)
Hemoglobin: 12.8 g/dL — ABNORMAL LOW (ref 13.0–17.0)
Immature Granulocytes: 0 %
Lymphocytes Relative: 33 %
Lymphs Abs: 1.4 10*3/uL (ref 0.7–4.0)
MCH: 29.3 pg (ref 26.0–34.0)
MCHC: 32.4 g/dL (ref 30.0–36.0)
MCV: 90.4 fL (ref 80.0–100.0)
Monocytes Absolute: 0.2 10*3/uL (ref 0.1–1.0)
Monocytes Relative: 6 %
Neutro Abs: 2.2 10*3/uL (ref 1.7–7.7)
Neutrophils Relative %: 52 %
Platelet Count: 120 10*3/uL — ABNORMAL LOW (ref 150–400)
RBC: 4.37 MIL/uL (ref 4.22–5.81)
RDW: 14.3 % (ref 11.5–15.5)
WBC Count: 4.2 10*3/uL (ref 4.0–10.5)
nRBC: 0 % (ref 0.0–0.2)

## 2022-05-28 LAB — CMP (CANCER CENTER ONLY)
ALT: 17 U/L (ref 0–44)
AST: 20 U/L (ref 15–41)
Albumin: 4.1 g/dL (ref 3.5–5.0)
Alkaline Phosphatase: 136 U/L — ABNORMAL HIGH (ref 38–126)
Anion gap: 8 (ref 5–15)
BUN: 12 mg/dL (ref 6–20)
CO2: 25 mmol/L (ref 22–32)
Calcium: 8.9 mg/dL (ref 8.9–10.3)
Chloride: 108 mmol/L (ref 98–111)
Creatinine: 1.78 mg/dL — ABNORMAL HIGH (ref 0.61–1.24)
GFR, Estimated: 45 mL/min — ABNORMAL LOW (ref >60)
Glucose, Bld: 154 mg/dL — ABNORMAL HIGH (ref 70–99)
Potassium: 4 mmol/L (ref 3.5–5.1)
Sodium: 141 mmol/L (ref 135–145)
Total Bilirubin: 0.9 mg/dL (ref 0.3–1.2)
Total Protein: 6.7 g/dL (ref 6.5–8.1)

## 2022-05-28 LAB — CEA (IN HOUSE-CHCC): CEA (CHCC-In House): 6.36 ng/mL — ABNORMAL HIGH (ref 0.00–5.00)

## 2022-05-28 NOTE — Progress Notes (Signed)
Hematology and Oncology Follow Up Visit  Keith [name] [id number] 1971-04-17 51 y.o. 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  He is doing pretty well.  He and his family did go on a [place] cruise.  He did enjoy this.  He was not all that active but yet, he is still able to have a good time.  He has last CT scan that was done back in August.  The CT scan not show any evidence of recurrent malignancy.  His last CEA level that was drawn back in February was 6.3.  He has his new hyperpigmented lesion in the right suprapubic region.  This is somewhat nodular.  Given his past history, this is going to have to be removed to make sure he does not have melanoma.  He has had no change in bowel or bladder habits.  On his CT scan, he did have a lot of constipation.  He has had no fever.  There has been no bleeding.  He has had no leg swelling.  He is still working.  He would like to be able to work half days at home.  I think this is reasonable just because of his high risk of infection due to his past chemotherapy that he had.  His appetite is okay.  I think his weight is holding steady.  Overall, I would say his performance status is probably ECOG 1.     Medications:  Allergies as of 05/28/2022   No Known Allergies      Medication List        Accurate as of May 28, 2022 11:00 AM. If you have any  questions, ask your nurse or doctor.          carboxymethylcellulose 0.5 % Soln Commonly known as: REFRESH PLUS Place 1 drop into both eyes 3 (three) times daily as needed (dry/irritated eyes.).   co-enzyme Q-10 30 MG capsule Take 30 mg by mouth as needed. 08/07/2020 Takes 2 times/weekly.   lipase/protease/amylase 36000 UNITS Cpep capsule Commonly known as: Creon Take 1 capsule (36,000 Units total) by mouth 3 (three) times daily with meals.   VITAMIN B-12 PO Take by mouth daily.   VITAMIN D (ERGOCALCIFEROL) PO Take by mouth daily. 01/19/2022 Patient does not know dose.        Allergies: No Known Allergies  Past Medical History, Surgical history, Social history, and Family History were reviewed and updated.  Review of Systems: Review of Systems  Constitutional:  Positive for malaise/fatigue.  HENT: Negative.    Eyes: Negative.   Respiratory: Negative.    Cardiovascular: Negative.   Gastrointestinal: Negative.   Genitourinary: Negative.   Musculoskeletal: Negative.   Skin:  Positive for rash.  Neurological: Negative.   Endo/Heme/Allergies: Negative.   Psychiatric/Behavioral: Negative.       Physical Exam:  height is '5\' 6"'  (1.676 m) and weight is  126 lb (57.2 kg). His oral temperature is 98.6 F (37 C). His blood pressure is 131/88 and his pulse is 62. His respiration is 18 and oxygen saturation is 100%.   Wt Readings from Last 3 Encounters:  05/28/22 126 lb (57.2 kg)  01/19/22 126 lb 12.8 oz (57.5 kg)  01/12/22 122 lb 6 oz (55.5 kg)    Physical Exam Vitals reviewed.  HENT:     Head: Normocephalic and atraumatic.  Eyes:     Pupils: Pupils are equal, round, and reactive to light.  Cardiovascular:     Rate and Rhythm: Normal rate and regular rhythm.     Heart sounds: Normal heart sounds.  Pulmonary:     Effort: Pulmonary effort is normal.     Breath sounds: Normal breath sounds.  Abdominal:     General: Bowel sounds are normal.     Palpations: Abdomen  is soft.     Comments: Abdominal exam shows a well-healed laparotomy scar.  He has no masses.  There is no fluid wave.  There is no palpable hepatosplenomegaly.  Musculoskeletal:        General: No tenderness or deformity. Normal range of motion.     Cervical back: Normal range of motion.  Lymphadenopathy:     Cervical: No cervical adenopathy.  Skin:    General: Skin is warm and dry.     Findings: No erythema or rash.     Comments: In the right suprapubic area, there is a nodular dark lesion.  It probably measures about 3 x 3 mm.  It is nontender.  Neurological:     Mental Status: He is alert and oriented to person, place, and time.  Psychiatric:        Behavior: Behavior normal.        Thought Content: Thought content normal.        Judgment: Judgment normal.      Lab Results  Component Value Date   WBC 4.2 05/28/2022   HGB 12.8 (L) 05/28/2022   HCT 39.5 05/28/2022   MCV 90.4 05/28/2022   PLT 120 (L) 05/28/2022   Lab Results  Component Value Date   FERRITIN 20 (L) 05/22/2021   IRON 71 05/22/2021   TIBC 384 05/22/2021   UIBC 313 05/22/2021   IRONPCTSAT 18 (L) 05/22/2021   Lab Results  Component Value Date   RETICCTPCT 1.6 05/22/2021   RBC 4.37 05/28/2022   No results found for: "KPAFRELGTCHN", "LAMBDASER", "KAPLAMBRATIO" No results found for: "IGGSERUM", "IGA", "IGMSERUM" No results found for: "TOTALPROTELP", "ALBUMINELP", "A1GS", "A2GS", "BETS", "BETA2SER", "GAMS", "MSPIKE", "SPEI"   Chemistry      Component Value Date/Time   NA 141 05/28/2022 1008   NA 147 (H) 08/06/2017 0816   NA 144 02/04/2017 0758   K 4.0 05/28/2022 1008   K 4.6 08/06/2017 0816   K 4.6 02/04/2017 0758   CL 108 05/28/2022 1008   CL 104 08/06/2017 0816   CO2 25 05/28/2022 1008   CO2 30 08/06/2017 0816   CO2 25 02/04/2017 0758   BUN 12 05/28/2022 1008   BUN 12 08/06/2017 0816   BUN 14.2 02/04/2017 0758   CREATININE 1.78 (H) 05/28/2022 1008   CREATININE 0.9 08/06/2017 0816   CREATININE  0.9 02/04/2017 0758      Component Value Date/Time   CALCIUM 8.9 05/28/2022 1008   CALCIUM 9.0 08/06/2017 0816   CALCIUM 8.9 02/04/2017 0758   ALKPHOS 136 (H) 05/28/2022 1008   ALKPHOS 108 (H) 08/06/2017 2536  ALKPHOS 113 02/04/2017 0758   AST 20 05/28/2022 1008   AST 24 02/04/2017 0758   ALT 17 05/28/2022 1008   ALT 27 08/06/2017 0816   ALT 20 02/04/2017 0758   BILITOT 0.9 05/28/2022 1008   BILITOT 0.91 02/04/2017 0758       Impression and Plan: Keith [name] is 51 yo [place] gentleman returns for a follow up for history of multiple maglinancies in the setting of Lynch syndrome.  I do not see any problems with recurrence of his past small bowel adenocarcinoma nor the left ureteral cancer.  He also had stomach cancer.  I do worry about this lesion in the right inguinal area.  This is in the right suprapubic region.  Again given the fact that he has his Lynch syndrome, he clearly is at risk for melanoma.  We are going to have to have dermatology take a look at this.  It would not surprise me if this is melanoma.  We will going to have to get him back to be seen probably in about 4 to 6 weeks.  We will will have to stay on top of this skin lesion.  We will see about making referral to dermatology.   Volanda Napoleon, MD 9/21/202311:00 AM

## 2022-05-28 NOTE — Telephone Encounter (Signed)
Per Dr. Marin Olp - Faxed urgent referral to Dermatology & [place] [phone]

## 2022-05-28 NOTE — Telephone Encounter (Signed)
Per 05/28/22 los - gave upcoming appointment - confirmed

## 2022-06-01 ENCOUNTER — Encounter: Payer: Self-pay | Admitting: *Deleted

## 2022-07-10 ENCOUNTER — Other Ambulatory Visit: Payer: Self-pay | Admitting: Oncology

## 2022-07-10 ENCOUNTER — Inpatient Hospital Stay: Payer: Managed Care, Other (non HMO) | Attending: Hematology & Oncology

## 2022-07-10 ENCOUNTER — Inpatient Hospital Stay: Payer: Managed Care, Other (non HMO) | Attending: Hematology & Oncology | Admitting: Hematology & Oncology

## 2022-07-10 ENCOUNTER — Encounter: Payer: Self-pay | Admitting: Hematology & Oncology

## 2022-07-10 VITALS — BP 137/78 | HR 62 | Temp 98.0°F | Resp 18 | Ht 67.5 in | Wt 127.2 lb

## 2022-07-10 DIAGNOSIS — Z9221 Personal history of antineoplastic chemotherapy: Secondary | ICD-10-CM | POA: Diagnosis not present

## 2022-07-10 DIAGNOSIS — Z86718 Personal history of other venous thrombosis and embolism: Secondary | ICD-10-CM | POA: Diagnosis not present

## 2022-07-10 DIAGNOSIS — C689 Malignant neoplasm of urinary organ, unspecified: Secondary | ICD-10-CM | POA: Diagnosis not present

## 2022-07-10 DIAGNOSIS — Z85068 Personal history of other malignant neoplasm of small intestine: Secondary | ICD-10-CM | POA: Diagnosis not present

## 2022-07-10 DIAGNOSIS — C169 Malignant neoplasm of stomach, unspecified: Secondary | ICD-10-CM | POA: Insufficient documentation

## 2022-07-10 DIAGNOSIS — Z1509 Genetic susceptibility to other malignant neoplasm: Secondary | ICD-10-CM | POA: Diagnosis not present

## 2022-07-10 DIAGNOSIS — C178 Malignant neoplasm of overlapping sites of small intestine: Secondary | ICD-10-CM

## 2022-07-10 DIAGNOSIS — Z923 Personal history of irradiation: Secondary | ICD-10-CM | POA: Insufficient documentation

## 2022-07-10 DIAGNOSIS — C662 Malignant neoplasm of left ureter: Secondary | ICD-10-CM | POA: Diagnosis present

## 2022-07-10 LAB — CMP (CANCER CENTER ONLY)
ALT: 11 U/L (ref 0–44)
AST: 19 U/L (ref 15–41)
Albumin: 4.4 g/dL (ref 3.5–5.0)
Alkaline Phosphatase: 149 U/L — ABNORMAL HIGH (ref 38–126)
Anion gap: 8 (ref 5–15)
BUN: 21 mg/dL — ABNORMAL HIGH (ref 6–20)
CO2: 25 mmol/L (ref 22–32)
Calcium: 9.4 mg/dL (ref 8.9–10.3)
Chloride: 107 mmol/L (ref 98–111)
Creatinine: 1.69 mg/dL — ABNORMAL HIGH (ref 0.61–1.24)
GFR, Estimated: 48 mL/min — ABNORMAL LOW (ref >60)
Glucose, Bld: 111 mg/dL — ABNORMAL HIGH (ref 70–99)
Potassium: 4.3 mmol/L (ref 3.5–5.1)
Sodium: 140 mmol/L (ref 135–145)
Total Bilirubin: 0.7 mg/dL (ref 0.3–1.2)
Total Protein: 7.3 g/dL (ref 6.5–8.1)

## 2022-07-10 LAB — CBC WITH DIFFERENTIAL (CANCER CENTER ONLY)
Abs Immature Granulocytes: 0.01 10*3/uL (ref 0.00–0.07)
Basophils Absolute: 0 10*3/uL (ref 0.0–0.1)
Basophils Relative: 1 %
Eosinophils Absolute: 0.3 10*3/uL (ref 0.0–0.5)
Eosinophils Relative: 8 %
HCT: 42.4 % (ref 39.0–52.0)
Hemoglobin: 13.7 g/dL (ref 13.0–17.0)
Immature Granulocytes: 0 %
Lymphocytes Relative: 24 %
Lymphs Abs: 1 10*3/uL (ref 0.7–4.0)
MCH: 29.1 pg (ref 26.0–34.0)
MCHC: 32.3 g/dL (ref 30.0–36.0)
MCV: 90 fL (ref 80.0–100.0)
Monocytes Absolute: 0.3 10*3/uL (ref 0.1–1.0)
Monocytes Relative: 7 %
Neutro Abs: 2.5 10*3/uL (ref 1.7–7.7)
Neutrophils Relative %: 60 %
Platelet Count: 125 10*3/uL — ABNORMAL LOW (ref 150–400)
RBC: 4.71 MIL/uL (ref 4.22–5.81)
RDW: 13.6 % (ref 11.5–15.5)
WBC Count: 4.2 10*3/uL (ref 4.0–10.5)
nRBC: 0 % (ref 0.0–0.2)

## 2022-07-10 LAB — LACTATE DEHYDROGENASE: LDH: 153 U/L (ref 98–192)

## 2022-07-10 LAB — CEA (IN HOUSE-CHCC): CEA (CHCC-In House): 7.83 ng/mL — ABNORMAL HIGH (ref 0.00–5.00)

## 2022-07-10 NOTE — Progress Notes (Signed)
Hematology and Oncology Follow Up Visit  Keith [name] [id number] 09-21-1970 51 y.o. 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  He looks fantastic.  He seems to be doing pretty well.  He has been running.  He is still working.  He has been very busy at work.  He has had no problems with bowels or bladder.  He has had no melena or bright red blood per rectum.  He has had no urinary issues.  He has had no nausea or vomiting.  His last CEA back in July was 6.4.  He has had no issues with leg swelling.  There is been no fever.  I forgot to mention that he did go to a dermatologist for the right inguinal lesion.  This was removed.  Thankfully, there is no malignancy.  Next year, he may go back to [place] for a visit.  We have the lab work on him today.  Overall, his appetite is doing quite well.  Currently, I would say his performance status is probably ECOG 0.     Medications:  Allergies as of 07/10/2022   No Known Allergies      Medication List        Accurate as of July 10, 2022 10:16 AM. If you have any questions, ask your nurse or doctor.          carboxymethylcellulose 0.5 % Soln Commonly known as: REFRESH PLUS Place 1 drop into both eyes 3 (three) times daily as needed (dry/irritated eyes.).   co-enzyme Q-10 30 MG capsule Take 30 mg by mouth as needed. 08/07/2020 Takes 2  times/weekly.   lipase/protease/amylase 36000 UNITS Cpep capsule Commonly known as: Creon Take 1 capsule (36,000 Units total) by mouth 3 (three) times daily with meals.   VITAMIN B-12 PO Take by mouth daily.   VITAMIN D (ERGOCALCIFEROL) PO Take by mouth daily. 01/19/2022 Patient does not know dose.        Allergies: No Known Allergies  Past Medical History, Surgical history, Social history, and Family History were reviewed and updated.  Review of Systems: Review of Systems  Constitutional:  Positive for malaise/fatigue.  HENT: Negative.    Eyes: Negative.   Respiratory: Negative.    Cardiovascular: Negative.   Gastrointestinal: Negative.   Genitourinary: Negative.   Musculoskeletal: Negative.   Skin:  Positive for rash.  Neurological: Negative.   Endo/Heme/Allergies: Negative.   Psychiatric/Behavioral: Negative.       Physical Exam:  height is 5' 7.5" (1.715 m) and weight is 127 lb 4 oz (57.7 kg). His oral temperature is 98 F (36.7 C). His blood pressure is 137/78 and his pulse is 62. His respiration is 18 and oxygen saturation is 98%.   Wt Readings from Last 3 Encounters:  07/10/22 127 lb 4 oz (57.7 kg)  05/28/22 126 lb (57.2 kg)  01/19/22 126 lb 12.8 oz (57.5  kg)    Physical Exam Vitals reviewed.  HENT:     Head: Normocephalic and atraumatic.  Eyes:     Pupils: Pupils are equal, round, and reactive to light.  Cardiovascular:     Rate and Rhythm: Normal rate and regular rhythm.     Heart sounds: Normal heart sounds.  Pulmonary:     Effort: Pulmonary effort is normal.     Breath sounds: Normal breath sounds.  Abdominal:     General: Bowel sounds are normal.     Palpations: Abdomen is soft.     Comments: Abdominal exam shows a well-healed laparotomy scar.  He has no masses.  There is no fluid wave.  There is no palpable hepatosplenomegaly.  Musculoskeletal:        General: No tenderness or deformity. Normal range of motion.     Cervical back: Normal  range of motion.  Lymphadenopathy:     Cervical: No cervical adenopathy.  Skin:    General: Skin is warm and dry.     Findings: No erythema or rash.     Comments: In the right suprapubic area, there is a nodular dark lesion.  It probably measures about 3 x 3 mm.  It is nontender.  Neurological:     Mental Status: He is alert and oriented to person, place, and time.  Psychiatric:        Behavior: Behavior normal.        Thought Content: Thought content normal.        Judgment: Judgment normal.      Lab Results  Component Value Date   WBC 4.2 05/28/2022   HGB 12.8 (L) 05/28/2022   HCT 39.5 05/28/2022   MCV 90.4 05/28/2022   PLT 120 (L) 05/28/2022   Lab Results  Component Value Date   FERRITIN 20 (L) 05/22/2021   IRON 71 05/22/2021   TIBC 384 05/22/2021   UIBC 313 05/22/2021   IRONPCTSAT 18 (L) 05/22/2021   Lab Results  Component Value Date   RETICCTPCT 1.6 05/22/2021   RBC 4.37 05/28/2022   No results found for: "KPAFRELGTCHN", "LAMBDASER", "KAPLAMBRATIO" No results found for: "IGGSERUM", "IGA", "IGMSERUM" No results found for: "TOTALPROTELP", "ALBUMINELP", "A1GS", "A2GS", "BETS", "BETA2SER", "GAMS", "MSPIKE", "SPEI"   Chemistry      Component Value Date/Time   NA 141 05/28/2022 1008   NA 147 (H) 08/06/2017 0816   NA 144 02/04/2017 0758   K 4.0 05/28/2022 1008   K 4.6 08/06/2017 0816   K 4.6 02/04/2017 0758   CL 108 05/28/2022 1008   CL 104 08/06/2017 0816   CO2 25 05/28/2022 1008   CO2 30 08/06/2017 0816   CO2 25 02/04/2017 0758   BUN 12 05/28/2022 1008   BUN 12 08/06/2017 0816   BUN 14.2 02/04/2017 0758   CREATININE 1.78 (H) 05/28/2022 1008   CREATININE 0.9 08/06/2017 0816   CREATININE 0.9 02/04/2017 0758      Component Value Date/Time   CALCIUM 8.9 05/28/2022 1008   CALCIUM 9.0 08/06/2017 0816   CALCIUM 8.9 02/04/2017 0758   ALKPHOS 136 (H) 05/28/2022 1008   ALKPHOS 108 (H) 08/06/2017 0816   ALKPHOS 113 02/04/2017 0758   AST 20 05/28/2022 1008    AST 24 02/04/2017 0758   ALT 17 05/28/2022 1008   ALT 27 08/06/2017 0816   ALT 20 02/04/2017 0758   BILITOT 0.9 05/28/2022 1008   BILITOT 0.91 02/04/2017 0758       Impression and Plan: Keith [name] is  51 yo [place] gentleman returns for a follow up for history of multiple maglinancies in the setting of Lynch syndrome.  I do not see any problems with recurrence of his past small bowel adenocarcinoma nor the left ureteral cancer.  He also had stomach cancer.  We really do need to get another CT scan on him.  This is for surveillance.  Given that he has a Lynch syndrome, he is at risk for recurrence or for additional primary malignancies.  I will go ahead and get a CT scan in December.  We will have to get him back to see [place] in 3 to 4 months.  He will go back to the lab today for lab work.  Volanda Napoleon, MD 11/3/202310:16 AM

## 2022-07-13 ENCOUNTER — Telehealth: Payer: Self-pay | Admitting: Hematology & Oncology

## 2022-07-13 NOTE — Telephone Encounter (Signed)
Patient has been scheduled for follow-up visit per 07/10/22 los. Pt given appt information via phone.

## 2022-08-25 ENCOUNTER — Ambulatory Visit (HOSPITAL_BASED_OUTPATIENT_CLINIC_OR_DEPARTMENT_OTHER): Payer: Managed Care, Other (non HMO)

## 2022-09-01 ENCOUNTER — Encounter (HOSPITAL_BASED_OUTPATIENT_CLINIC_OR_DEPARTMENT_OTHER): Payer: Self-pay

## 2022-09-01 ENCOUNTER — Ambulatory Visit (HOSPITAL_BASED_OUTPATIENT_CLINIC_OR_DEPARTMENT_OTHER)
Admission: RE | Admit: 2022-09-01 | Discharge: 2022-09-01 | Disposition: A | Discharge status: Home / Self Care | Payer: Managed Care, Other (non HMO) | Source: Ambulatory Visit | Attending: Hematology & Oncology | Admitting: Hematology & Oncology

## 2022-09-01 DIAGNOSIS — C689 Malignant neoplasm of urinary organ, unspecified: Secondary | ICD-10-CM | POA: Diagnosis present

## 2022-09-01 DIAGNOSIS — C178 Malignant neoplasm of overlapping sites of small intestine: Secondary | ICD-10-CM | POA: Diagnosis present

## 2022-09-01 MED ORDER — IOHEXOL 300 MG/ML  SOLN
100.0000 mL | Freq: Once | INTRAMUSCULAR | Status: AC | PRN
  Administered 2022-09-01: 75 mL via INTRAVENOUS

## 2022-09-03 ENCOUNTER — Encounter: Payer: Self-pay | Admitting: *Deleted

## 2022-09-13 IMAGING — CT CT ABD-PELV W/ CM
1 of 3 series · 13 of 32 positions shown, 19 images · IV contrast (iopamidol)
Comparison: 07/01/2020

CLINICAL DATA: Abdominal pain, nausea, and fever for 3 weeks.
Personal history of gastric carcinoma and urothelial carcinoma.

EXAM:
CT ABDOMEN AND PELVIS WITH CONTRAST
TECHNIQUE: Multidetector CT imaging of the abdomen and pelvis was performed
using the standard protocol following bolus administration of
intravenous contrast.
CONTRAST:  100mL [id number] IOPAMIDOL ([id number]) INJECTION 61%

[Series 2: abd/pelvis w/cm · axial · 0.68mm/px · z∈[-500,-105]mm · 13 of 93 slices shown, 19 images]
[im 7/93  soft-tissue]
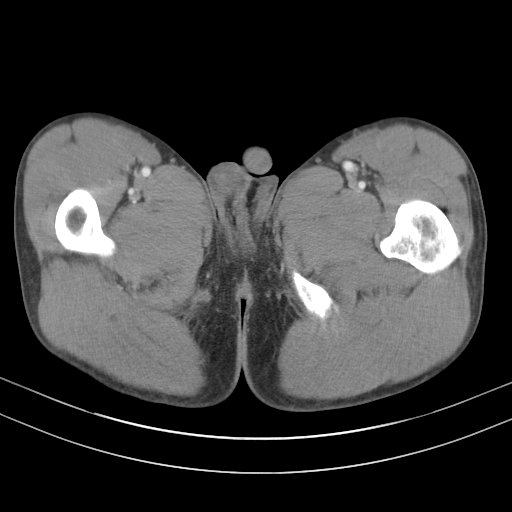
[im 7/93  bone]
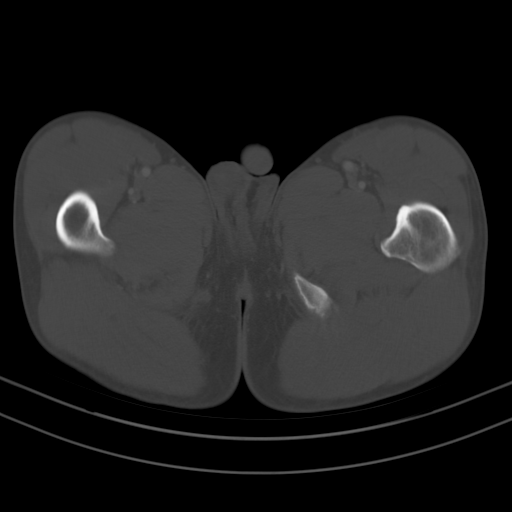
[im 13/93  soft-tissue]
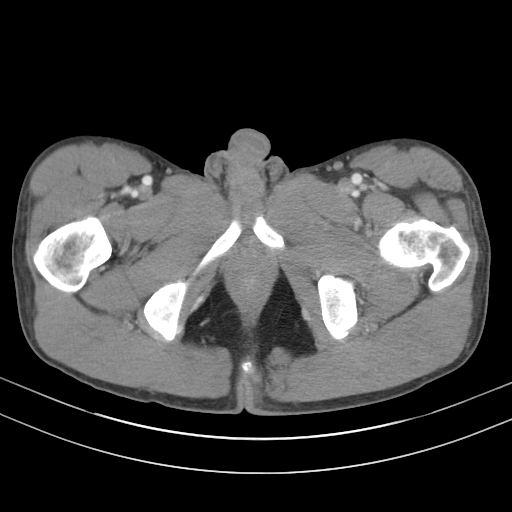
[im 19/93  soft-tissue]
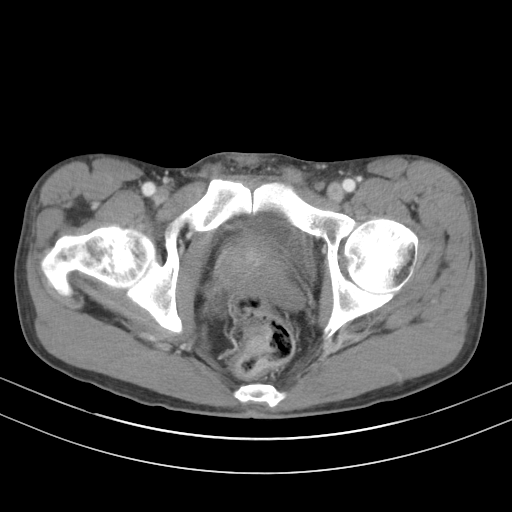
[im 25/93  soft-tissue]
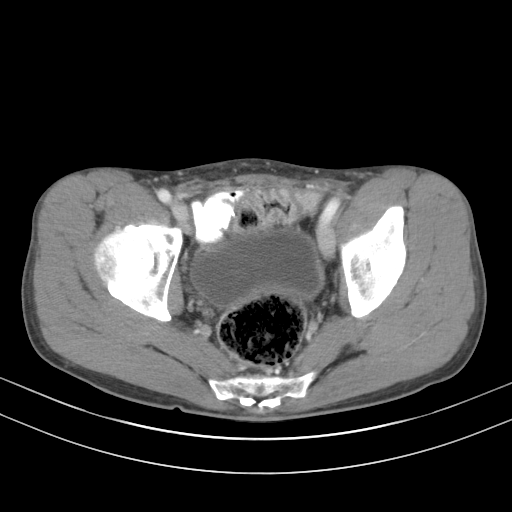
[im 31/93  soft-tissue]
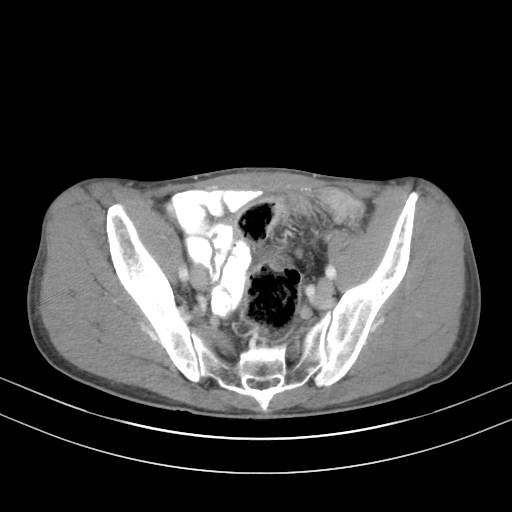
[im 37/93  soft-tissue]
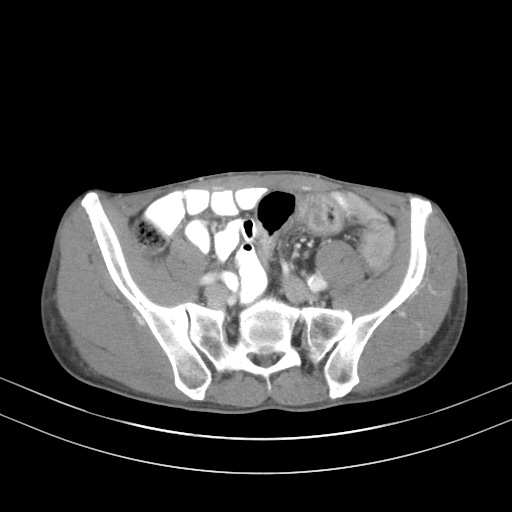
[im 50/93  soft-tissue]
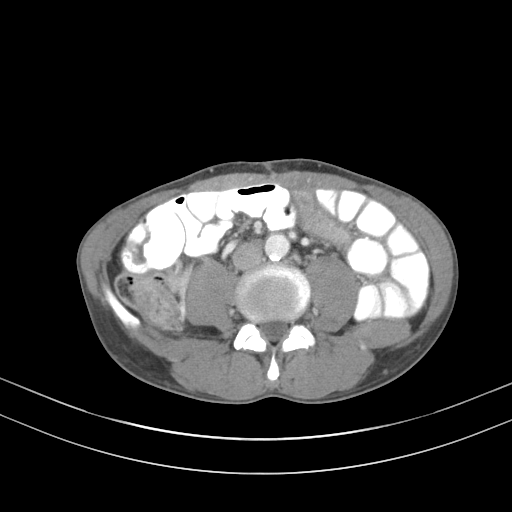
[im 56/93  soft-tissue]
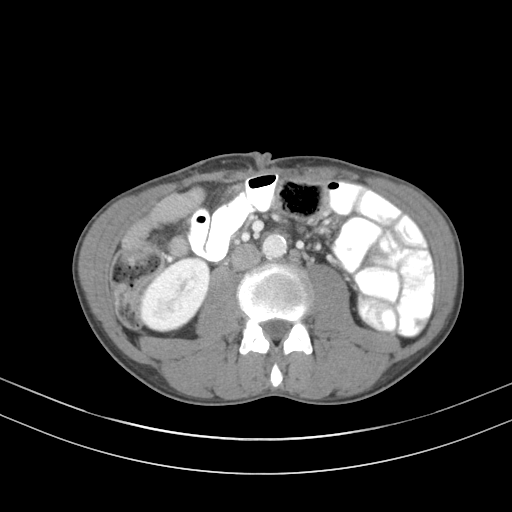
[im 62/93  soft-tissue]
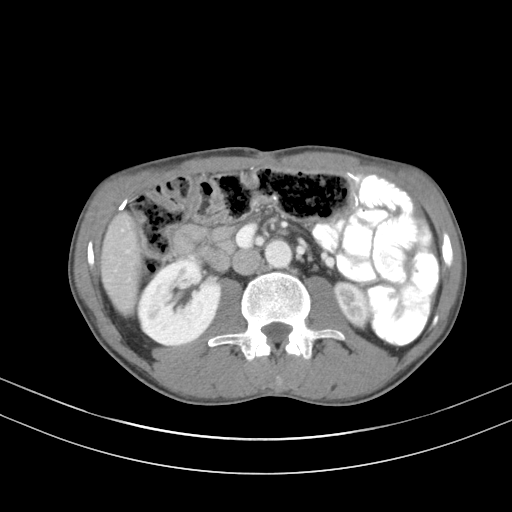
[im 62/93  bone]
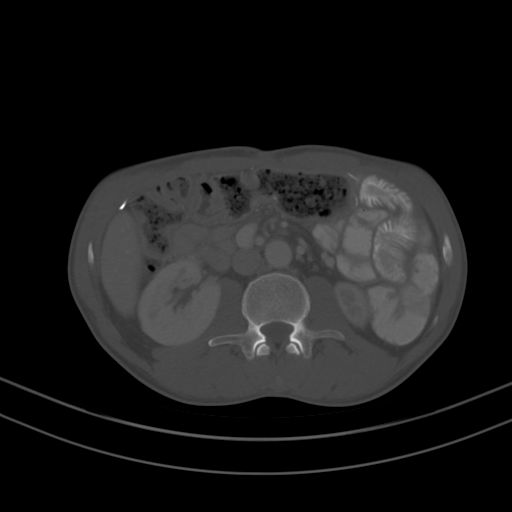
[im 68/93  soft-tissue]
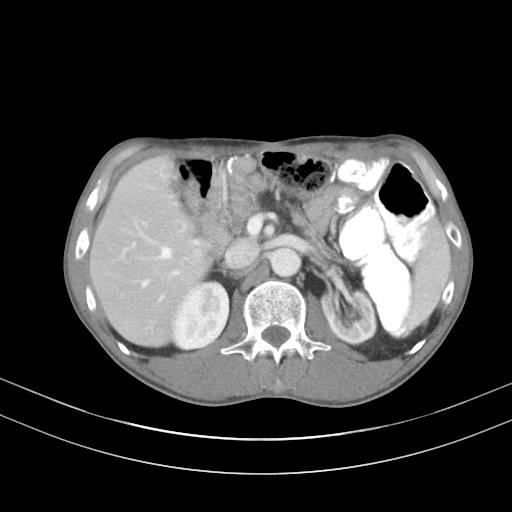
[im 68/93  lung]
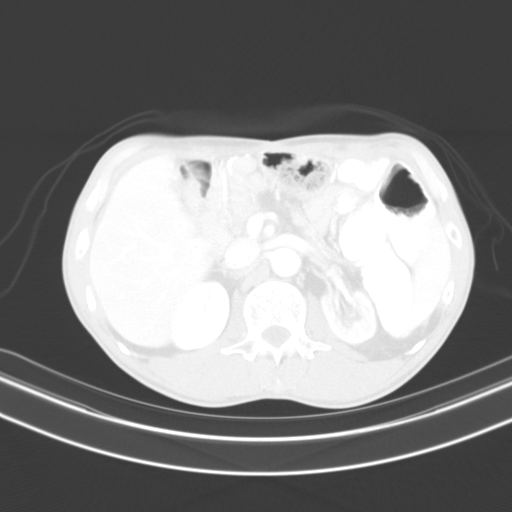
[im 74/93  soft-tissue]
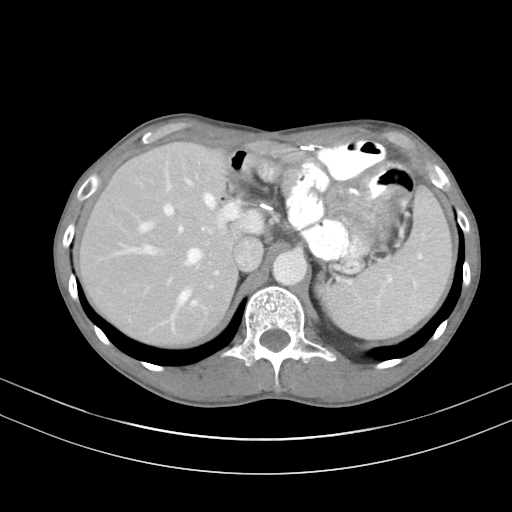
[im 74/93  lung]
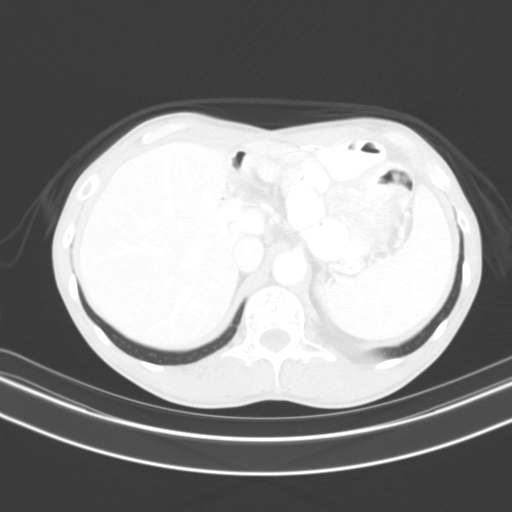
[im 80/93  soft-tissue]
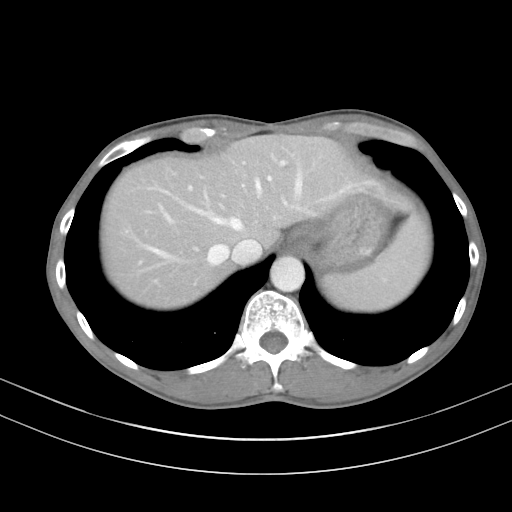
[im 80/93  lung]
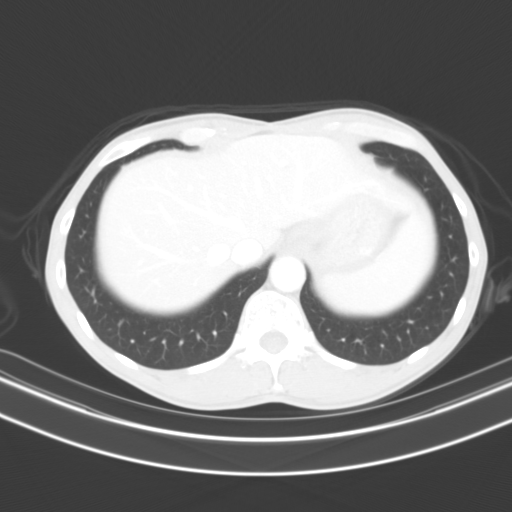
[im 86/93  soft-tissue]
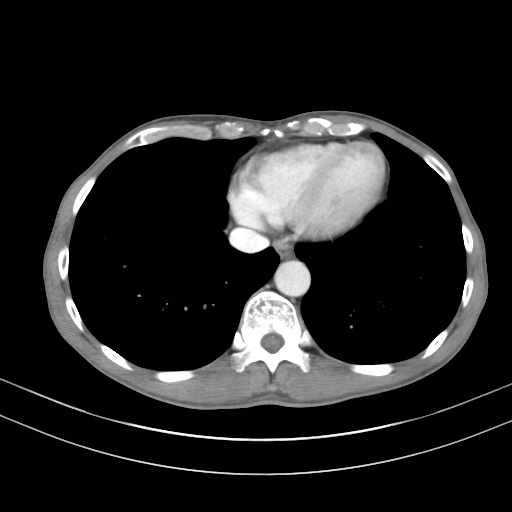
[im 86/93  lung]
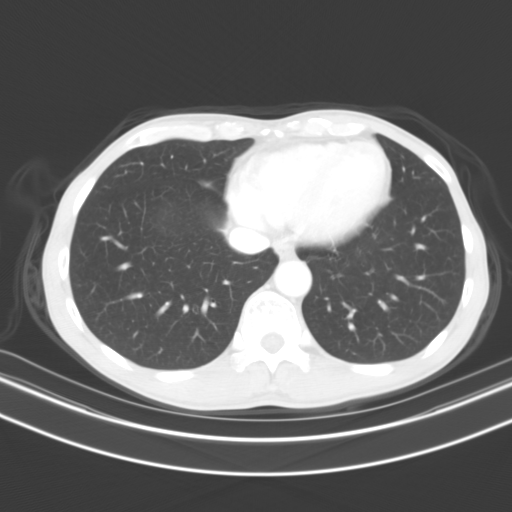

[13 of 32 positions shown; findings below may reference images not displayed]

FINDINGS: Lower Chest: No acute findings.

Hepatobiliary: No hepatic masses identified. Prior cholecystectomy
again noted. No evidence of biliary ductal dilatation.

Pancreas: Stable postop changes from Whipple procedure. No mass or
inflammatory changes identified. No abnormal fluid collections seen.

Spleen: Within normal limits in size and appearance.

Adrenals/Urinary Tract: Normal appearance of adrenal glands and
right kidney. Moderate diffuse left renal atrophy is unchanged. No
evidence of ureteral calculi or hydronephrosis. Mild diffuse bladder
wall thickening is unchanged, likely due to chronic bladder outlet
obstruction given enlarged prostate.

Stomach/Bowel: Stable postop changes from partial gastrectomy.
Stable mild wall thickening involving the residual stomach. No
evidence of obstruction, inflammatory process or abnormal fluid
collections.

Vascular/Lymphatic: Several Sub-cm retroperitoneal lymph nodes in
the left paraaortic region are stable. No pathologically enlarged
lymph nodes. No acute vascular findings. Aortic atherosclerotic
calcification noted.

Reproductive:  Stable mildly enlarged prostate.

Other:  None.

Musculoskeletal:  No suspicious bone lesions identified.
IMPRESSION: No acute findings.

Stable postop changes from Whipple procedure and partial
gastrectomy. No evidence of recurrent or metastatic carcinoma.

Stable mildly enlarged prostate and findings of chronic bladder
outlet obstruction.

Aortic Atherosclerosis ([id number]).

## 2022-10-12 ENCOUNTER — Encounter: Payer: Self-pay | Admitting: *Deleted

## 2022-10-12 ENCOUNTER — Inpatient Hospital Stay: Payer: Managed Care, Other (non HMO)

## 2022-10-12 ENCOUNTER — Inpatient Hospital Stay: Payer: Managed Care, Other (non HMO) | Attending: Hematology & Oncology | Admitting: Hematology & Oncology

## 2022-10-12 ENCOUNTER — Other Ambulatory Visit: Payer: Self-pay

## 2022-10-12 ENCOUNTER — Encounter: Payer: Self-pay | Admitting: Hematology & Oncology

## 2022-10-12 VITALS — BP 152/94 | HR 58 | Temp 97.6°F | Resp 16 | Ht 67.5 in | Wt 131.0 lb

## 2022-10-12 DIAGNOSIS — Z923 Personal history of irradiation: Secondary | ICD-10-CM | POA: Insufficient documentation

## 2022-10-12 DIAGNOSIS — C178 Malignant neoplasm of overlapping sites of small intestine: Secondary | ICD-10-CM | POA: Diagnosis not present

## 2022-10-12 DIAGNOSIS — Z9221 Personal history of antineoplastic chemotherapy: Secondary | ICD-10-CM | POA: Diagnosis not present

## 2022-10-12 DIAGNOSIS — Z86718 Personal history of other venous thrombosis and embolism: Secondary | ICD-10-CM | POA: Diagnosis not present

## 2022-10-12 DIAGNOSIS — C662 Malignant neoplasm of left ureter: Secondary | ICD-10-CM | POA: Insufficient documentation

## 2022-10-12 DIAGNOSIS — Z1509 Genetic susceptibility to other malignant neoplasm: Secondary | ICD-10-CM | POA: Insufficient documentation

## 2022-10-12 DIAGNOSIS — R5383 Other fatigue: Secondary | ICD-10-CM | POA: Diagnosis not present

## 2022-10-12 DIAGNOSIS — Z85068 Personal history of other malignant neoplasm of small intestine: Secondary | ICD-10-CM | POA: Insufficient documentation

## 2022-10-12 DIAGNOSIS — C689 Malignant neoplasm of urinary organ, unspecified: Secondary | ICD-10-CM

## 2022-10-12 DIAGNOSIS — R970 Elevated carcinoembryonic antigen [CEA]: Secondary | ICD-10-CM | POA: Insufficient documentation

## 2022-10-12 DIAGNOSIS — C169 Malignant neoplasm of stomach, unspecified: Secondary | ICD-10-CM | POA: Diagnosis not present

## 2022-10-12 LAB — CBC WITH DIFFERENTIAL (CANCER CENTER ONLY)
Abs Immature Granulocytes: 0.01 10*3/uL (ref 0.00–0.07)
Basophils Absolute: 0 10*3/uL (ref 0.0–0.1)
Basophils Relative: 1 %
Eosinophils Absolute: 0.4 10*3/uL (ref 0.0–0.5)
Eosinophils Relative: 8 %
HCT: 40.3 % (ref 39.0–52.0)
Hemoglobin: 13 g/dL (ref 13.0–17.0)
Immature Granulocytes: 0 %
Lymphocytes Relative: 34 %
Lymphs Abs: 1.6 10*3/uL (ref 0.7–4.0)
MCH: 29.8 pg (ref 26.0–34.0)
MCHC: 32.3 g/dL (ref 30.0–36.0)
MCV: 92.4 fL (ref 80.0–100.0)
Monocytes Absolute: 0.4 10*3/uL (ref 0.1–1.0)
Monocytes Relative: 8 %
Neutro Abs: 2.4 10*3/uL (ref 1.7–7.7)
Neutrophils Relative %: 49 %
Platelet Count: 117 10*3/uL — ABNORMAL LOW (ref 150–400)
RBC: 4.36 MIL/uL (ref 4.22–5.81)
RDW: 13.3 % (ref 11.5–15.5)
WBC Count: 4.8 10*3/uL (ref 4.0–10.5)
nRBC: 0 % (ref 0.0–0.2)

## 2022-10-12 LAB — CMP (CANCER CENTER ONLY)
ALT: 20 U/L (ref 0–44)
AST: 26 U/L (ref 15–41)
Albumin: 4.2 g/dL (ref 3.5–5.0)
Alkaline Phosphatase: 117 U/L (ref 38–126)
Anion gap: 8 (ref 5–15)
BUN: 13 mg/dL (ref 6–20)
CO2: 27 mmol/L (ref 22–32)
Calcium: 9.3 mg/dL (ref 8.9–10.3)
Chloride: 106 mmol/L (ref 98–111)
Creatinine: 1.72 mg/dL — ABNORMAL HIGH (ref 0.61–1.24)
GFR, Estimated: 47 mL/min — ABNORMAL LOW (ref >60)
Glucose, Bld: 101 mg/dL — ABNORMAL HIGH (ref 70–99)
Potassium: 4.8 mmol/L (ref 3.5–5.1)
Sodium: 141 mmol/L (ref 135–145)
Total Bilirubin: 0.9 mg/dL (ref 0.3–1.2)
Total Protein: 6.9 g/dL (ref 6.5–8.1)

## 2022-10-12 LAB — LACTATE DEHYDROGENASE: LDH: 157 U/L (ref 98–192)

## 2022-10-12 LAB — CEA (IN HOUSE-CHCC): CEA (CHCC-In House): 5.71 ng/mL — ABNORMAL HIGH (ref 0.00–5.00)

## 2022-10-12 NOTE — Progress Notes (Signed)
Hematology and Oncology Follow Up Visit  Denim Kalmbach [id number] [birth year] 52 y.o. 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 Melichar is here today for follow-up.  We last saw him back in November.  In December, we did do a CT scan on him.  The CT scan was done right before New Year's.  Thankfully, the CT scan not show any evidence of obvious recurrent disease.   He feels well.  He has had no problems with pain.  Is been no change in bowel or bladder habits.  He has had no cough or shortness of breath.  He has had no nausea or vomiting.  His last CEA back in November was up a little bit at 7.6.  We still have to monitor this.  He has had no leg swelling.  He has had no rashes.  The big news is that he will hopefully go to [place] to see family this summer.  It has been 5 years since has been there.  Overall, I would say that his performance status is probably ECOG 0.   Medications:  Allergies as of 10/12/2022   No Known Allergies      Medication List        Accurate as of October 12, 2022  9:01 AM. If you have any questions, ask your nurse or doctor.          carboxymethylcellulose 0.5 % Soln Commonly known as: REFRESH PLUS Place 1 drop into both eyes 3 (three) times daily as needed (dry/irritated eyes.).   co-enzyme Q-10 30 MG capsule Take 30 mg by mouth as needed. 08/07/2020 Takes 2 times/weekly.   lipase/protease/amylase 36000  UNITS Cpep capsule Commonly known as: Creon Take 1 capsule (36,000 Units total) by mouth 3 (three) times daily with meals.   VITAMIN B-12 PO Take by mouth daily.   VITAMIN D (ERGOCALCIFEROL) PO Take by mouth daily. 01/19/2022 Patient does not know dose.        Allergies: No Known Allergies  Past Medical History, Surgical history, Social history, and Family History were reviewed and updated.  Review of Systems: Review of Systems  Constitutional:  Positive for malaise/fatigue.  HENT: Negative.    Eyes: Negative.   Respiratory: Negative.    Cardiovascular: Negative.   Gastrointestinal: Negative.   Genitourinary: Negative.   Musculoskeletal: Negative.   Skin:  Positive for rash.  Neurological: Negative.   Endo/Heme/Allergies: Negative.   Psychiatric/Behavioral: Negative.       Physical Exam:  height is 5' 7.5" (1.715 m) and weight is 131 lb (59.4 kg). His oral temperature is 97.6 F (36.4 C). His blood pressure is 152/94 (abnormal) and his pulse is 58 (abnormal). His respiration is 16 and oxygen saturation is 100%.   Wt Readings from Last 3 Encounters:  10/12/22 131 lb (59.4 kg)  07/10/22 127 lb 4 oz (57.7 kg)  05/28/22 126 lb (57.2 kg)    Physical  Exam Vitals reviewed.  HENT:     Head: Normocephalic and atraumatic.  Eyes:     Pupils: Pupils are equal, round, and reactive to light.  Cardiovascular:     Rate and Rhythm: Normal rate and regular rhythm.     Heart sounds: Normal heart sounds.  Pulmonary:     Effort: Pulmonary effort is normal.     Breath sounds: Normal breath sounds.  Abdominal:     General: Bowel sounds are normal.     Palpations: Abdomen is soft.     Comments: Abdominal exam shows a well-healed laparotomy scar.  He has no masses.  There is no fluid wave.  There is no palpable hepatosplenomegaly.  Musculoskeletal:        General: No tenderness or deformity. Normal range of motion.     Cervical back: Normal range of motion.  Lymphadenopathy:      Cervical: No cervical adenopathy.  Skin:    General: Skin is warm and dry.     Findings: No erythema or rash.     Comments: In the right suprapubic area, there is a nodular dark lesion.  It probably measures about 3 x 3 mm.  It is nontender.  Neurological:     Mental Status: He is alert and oriented to person, place, and time.  Psychiatric:        Behavior: Behavior normal.        Thought Content: Thought content normal.        Judgment: Judgment normal.      Lab Results  Component Value Date   WBC 4.8 10/12/2022   HGB 13.0 10/12/2022   HCT 40.3 10/12/2022   MCV 92.4 10/12/2022   PLT 117 (L) 10/12/2022   Lab Results  Component Value Date   FERRITIN 20 (L) 05/22/2021   IRON 71 05/22/2021   TIBC 384 05/22/2021   UIBC 313 05/22/2021   IRONPCTSAT 18 (L) 05/22/2021   Lab Results  Component Value Date   RETICCTPCT 1.6 05/22/2021   RBC 4.36 10/12/2022   No results found for: "KPAFRELGTCHN", "LAMBDASER", "KAPLAMBRATIO" No results found for: "IGGSERUM", "IGA", "IGMSERUM" No results found for: "TOTALPROTELP", "ALBUMINELP", "A1GS", "A2GS", "BETS", "BETA2SER", "GAMS", "MSPIKE", "SPEI"   Chemistry      Component Value Date/Time   NA 140 07/10/2022 1036   NA 147 (H) 08/06/2017 0816   NA 144 02/04/2017 0758   K 4.3 07/10/2022 1036   K 4.6 08/06/2017 0816   K 4.6 02/04/2017 0758   CL 107 07/10/2022 1036   CL 104 08/06/2017 0816   CO2 25 07/10/2022 1036   CO2 30 08/06/2017 0816   CO2 25 02/04/2017 0758   BUN 21 (H) 07/10/2022 1036   BUN 12 08/06/2017 0816   BUN 14.2 02/04/2017 0758   CREATININE 1.69 (H) 07/10/2022 1036   CREATININE 0.9 08/06/2017 0816   CREATININE 0.9 02/04/2017 0758      Component Value Date/Time   CALCIUM 9.4 07/10/2022 1036   CALCIUM 9.0 08/06/2017 0816   CALCIUM 8.9 02/04/2017 0758   ALKPHOS 149 (H) 07/10/2022 1036   ALKPHOS 108 (H) 08/06/2017 0816   ALKPHOS 113 02/04/2017 0758   AST 19 07/10/2022 1036   AST 24 02/04/2017 0758   ALT 11  07/10/2022 1036   ALT 27 08/06/2017 0816   ALT 20 02/04/2017 0758   BILITOT 0.7 07/10/2022 1036   BILITOT 0.91 02/04/2017 0758       Impression and Plan: Mr. Hemp is 52 yo [place] gentleman returns  for a follow up for history of multiple maglinancies in the setting of Lynch syndrome.  I do not see any problems with recurrence of his past small bowel adenocarcinoma nor the left ureteral cancer.  He also has had stomach cancer.  We really do need to get another CT scan on him.  I will set 1 up for him in May.  We will see him back after he has a CT scan done.  We will have to monitor the CEA level.   Volanda Napoleon, MD 2/5/20249:01 AM

## 2023-01-11 ENCOUNTER — Encounter (HOSPITAL_BASED_OUTPATIENT_CLINIC_OR_DEPARTMENT_OTHER): Payer: Self-pay

## 2023-01-11 ENCOUNTER — Ambulatory Visit (HOSPITAL_BASED_OUTPATIENT_CLINIC_OR_DEPARTMENT_OTHER)
Admission: RE | Admit: 2023-01-11 | Discharge: 2023-01-11 | Disposition: A | Discharge status: Home / Self Care | Payer: Managed Care, Other (non HMO) | Source: Ambulatory Visit | Attending: Hematology & Oncology | Admitting: Hematology & Oncology

## 2023-01-11 DIAGNOSIS — C178 Malignant neoplasm of overlapping sites of small intestine: Secondary | ICD-10-CM

## 2023-01-11 MED ORDER — IOHEXOL 300 MG/ML  SOLN
100.0000 mL | Freq: Once | INTRAMUSCULAR | Status: AC | PRN
  Administered 2023-01-11: 75 mL via INTRAVENOUS

## 2023-01-14 ENCOUNTER — Encounter: Payer: Self-pay | Admitting: *Deleted

## 2023-01-20 ENCOUNTER — Inpatient Hospital Stay (HOSPITAL_BASED_OUTPATIENT_CLINIC_OR_DEPARTMENT_OTHER): Payer: Managed Care, Other (non HMO) | Admitting: Hematology & Oncology

## 2023-01-20 ENCOUNTER — Other Ambulatory Visit: Payer: Self-pay

## 2023-01-20 ENCOUNTER — Inpatient Hospital Stay: Payer: Managed Care, Other (non HMO) | Attending: Hematology & Oncology

## 2023-01-20 ENCOUNTER — Encounter: Payer: Self-pay | Admitting: Hematology & Oncology

## 2023-01-20 VITALS — BP 142/96 | HR 50 | Temp 97.9°F | Resp 16 | Ht 66.0 in | Wt 126.1 lb

## 2023-01-20 DIAGNOSIS — C689 Malignant neoplasm of urinary organ, unspecified: Secondary | ICD-10-CM

## 2023-01-20 DIAGNOSIS — Z923 Personal history of irradiation: Secondary | ICD-10-CM | POA: Diagnosis not present

## 2023-01-20 DIAGNOSIS — Z85068 Personal history of other malignant neoplasm of small intestine: Secondary | ICD-10-CM | POA: Insufficient documentation

## 2023-01-20 DIAGNOSIS — C169 Malignant neoplasm of stomach, unspecified: Secondary | ICD-10-CM | POA: Insufficient documentation

## 2023-01-20 DIAGNOSIS — C160 Malignant neoplasm of cardia: Secondary | ICD-10-CM | POA: Diagnosis not present

## 2023-01-20 DIAGNOSIS — Z1509 Genetic susceptibility to other malignant neoplasm: Secondary | ICD-10-CM | POA: Insufficient documentation

## 2023-01-20 DIAGNOSIS — R5383 Other fatigue: Secondary | ICD-10-CM | POA: Diagnosis not present

## 2023-01-20 DIAGNOSIS — C662 Malignant neoplasm of left ureter: Secondary | ICD-10-CM | POA: Insufficient documentation

## 2023-01-20 DIAGNOSIS — C178 Malignant neoplasm of overlapping sites of small intestine: Secondary | ICD-10-CM | POA: Diagnosis not present

## 2023-01-20 DIAGNOSIS — R21 Rash and other nonspecific skin eruption: Secondary | ICD-10-CM | POA: Insufficient documentation

## 2023-01-20 DIAGNOSIS — Z9221 Personal history of antineoplastic chemotherapy: Secondary | ICD-10-CM | POA: Insufficient documentation

## 2023-01-20 DIAGNOSIS — Z86718 Personal history of other venous thrombosis and embolism: Secondary | ICD-10-CM | POA: Diagnosis not present

## 2023-01-20 LAB — CBC WITH DIFFERENTIAL (CANCER CENTER ONLY)
Abs Immature Granulocytes: 0 10*3/uL (ref 0.00–0.07)
Basophils Absolute: 0.1 10*3/uL (ref 0.0–0.1)
Basophils Relative: 1 %
Eosinophils Absolute: 0.3 10*3/uL (ref 0.0–0.5)
Eosinophils Relative: 8 %
HCT: 39.7 % (ref 39.0–52.0)
Hemoglobin: 13.2 g/dL (ref 13.0–17.0)
Immature Granulocytes: 0 %
Lymphocytes Relative: 35 %
Lymphs Abs: 1.4 10*3/uL (ref 0.7–4.0)
MCH: 30.8 pg (ref 26.0–34.0)
MCHC: 33.2 g/dL (ref 30.0–36.0)
MCV: 92.8 fL (ref 80.0–100.0)
Monocytes Absolute: 0.3 10*3/uL (ref 0.1–1.0)
Monocytes Relative: 7 %
Neutro Abs: 1.9 10*3/uL (ref 1.7–7.7)
Neutrophils Relative %: 49 %
Platelet Count: 135 10*3/uL — ABNORMAL LOW (ref 150–400)
RBC: 4.28 MIL/uL (ref 4.22–5.81)
RDW: 13.2 % (ref 11.5–15.5)
WBC Count: 4 10*3/uL (ref 4.0–10.5)
nRBC: 0 % (ref 0.0–0.2)

## 2023-01-20 LAB — CMP (CANCER CENTER ONLY)
ALT: 18 U/L (ref 0–44)
AST: 23 U/L (ref 15–41)
Albumin: 4.3 g/dL (ref 3.5–5.0)
Alkaline Phosphatase: 131 U/L — ABNORMAL HIGH (ref 38–126)
Anion gap: 10 (ref 5–15)
BUN: 11 mg/dL (ref 6–20)
CO2: 28 mmol/L (ref 22–32)
Calcium: 9.2 mg/dL (ref 8.9–10.3)
Chloride: 104 mmol/L (ref 98–111)
Creatinine: 1.68 mg/dL — ABNORMAL HIGH (ref 0.61–1.24)
GFR, Estimated: 48 mL/min — ABNORMAL LOW (ref >60)
Glucose, Bld: 102 mg/dL — ABNORMAL HIGH (ref 70–99)
Potassium: 4.3 mmol/L (ref 3.5–5.1)
Sodium: 142 mmol/L (ref 135–145)
Total Bilirubin: 0.9 mg/dL (ref 0.3–1.2)
Total Protein: 6.7 g/dL (ref 6.5–8.1)

## 2023-01-20 LAB — CEA (IN HOUSE-CHCC): CEA (CHCC-In House): 4.83 ng/mL (ref 0.00–5.00)

## 2023-01-20 LAB — LACTATE DEHYDROGENASE: LDH: 153 U/L (ref 98–192)

## 2023-01-20 NOTE — Progress Notes (Signed)
Hematology and Oncology Follow Up Visit  Shoji Dize [id number] 1970/11/06 52 y.o. 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 Biagini is here today for follow-up.  Overall, I think is doing pretty well.  He did have a CT scan that was done.  This was done on 01/11/2023.  There is no evidence of recurrent cancer.  He did have some atrophy of the left kidney.  I do not know if there is anything that was related to the left ureter cancer.  He has had no problems urinating.  He is exercising.  He is running.  He has had no nausea or vomiting.  He has had no change in bowel or bladder habits.  There has been no bleeding.  He has had no rashes.  There is been no leg swelling.  His last CEA level was 5.7.  Currently, I would have to say that his performance status is probably ECOG 1.   Medications:  Allergies as of 01/20/2023   No Known Allergies      Medication List        Accurate as of Jan 20, 2023  9:30 AM. If you have any questions, ask your nurse or doctor.          acetaminophen 325 MG tablet Commonly known as: TYLENOL Take 650 mg by mouth every 6 (six) hours as needed for headache.   carboxymethylcellulose 0.5 % Soln Commonly known as: REFRESH PLUS Place 1 drop into both eyes 3 (three) times daily as needed (dry/irritated eyes.).   co-enzyme Q-10 30 MG capsule Take 30 mg by mouth as needed. 08/07/2020 Takes 2 times/weekly.    lipase/protease/amylase 95621 UNITS Cpep capsule Commonly known as: Creon Take 1 capsule (36,000 Units total) by mouth 3 (three) times daily with meals.   pantoprazole 40 MG tablet Commonly known as: PROTONIX Take 40 mg by mouth 2 (two) times daily.   VITAMIN B-12 PO Take by mouth daily.   VITAMIN D (ERGOCALCIFEROL) PO Take by mouth daily. 01/19/2022 Patient does not know dose.        Allergies: No Known Allergies  Past Medical History, Surgical history, Social history, and Family History were reviewed and updated.  Review of Systems: Review of Systems  Constitutional:  Positive for malaise/fatigue.  HENT: Negative.    Eyes: Negative.   Respiratory: Negative.    Cardiovascular: Negative.   Gastrointestinal: Negative.   Genitourinary: Negative.   Musculoskeletal: Negative.   Skin:  Positive for rash.  Neurological: Negative.   Endo/Heme/Allergies: Negative.   Psychiatric/Behavioral: Negative.       Physical Exam:  height is 5\' 6"  (1.676 m) and weight is 126 lb 1.9 oz (57.2 kg). His oral temperature is 97.9 F (36.6 C). His blood pressure is 142/96 (abnormal) and his pulse is 50 (abnormal). His respiration is 16 and oxygen saturation is 100%.   Wt Readings from Last 3 Encounters:  01/20/23 126  lb 1.9 oz (57.2 kg)  10/12/22 131 lb (59.4 kg)  07/10/22 127 lb 4 oz (57.7 kg)    Physical Exam Vitals reviewed.  HENT:     Head: Normocephalic and atraumatic.  Eyes:     Pupils: Pupils are equal, round, and reactive to light.  Cardiovascular:     Rate and Rhythm: Normal rate and regular rhythm.     Heart sounds: Normal heart sounds.  Pulmonary:     Effort: Pulmonary effort is normal.     Breath sounds: Normal breath sounds.  Abdominal:     General: Bowel sounds are normal.     Palpations: Abdomen is soft.     Comments: Abdominal exam shows a well-healed laparotomy scar.  He has no masses.  There is no fluid wave.  There is no palpable hepatosplenomegaly.   Musculoskeletal:        General: No tenderness or deformity. Normal range of motion.     Cervical back: Normal range of motion.  Lymphadenopathy:     Cervical: No cervical adenopathy.  Skin:    General: Skin is warm and dry.     Findings: No erythema or rash.     Comments: In the right suprapubic area, there is a nodular dark lesion.  It probably measures about 3 x 3 mm.  It is nontender.  Neurological:     Mental Status: He is alert and oriented to person, place, and time.  Psychiatric:        Behavior: Behavior normal.        Thought Content: Thought content normal.        Judgment: Judgment normal.      Lab Results  Component Value Date   WBC 4.0 01/20/2023   HGB 13.2 01/20/2023   HCT 39.7 01/20/2023   MCV 92.8 01/20/2023   PLT 135 (L) 01/20/2023   Lab Results  Component Value Date   FERRITIN 20 (L) 05/22/2021   IRON 71 05/22/2021   TIBC 384 05/22/2021   UIBC 313 05/22/2021   IRONPCTSAT 18 (L) 05/22/2021   Lab Results  Component Value Date   RETICCTPCT 1.6 05/22/2021   RBC 4.28 01/20/2023   No results found for: "KPAFRELGTCHN", "LAMBDASER", "KAPLAMBRATIO" No results found for: "IGGSERUM", "IGA", "IGMSERUM" No results found for: "TOTALPROTELP", "ALBUMINELP", "A1GS", "A2GS", "BETS", "BETA2SER", "GAMS", "MSPIKE", "SPEI"   Chemistry      Component Value Date/Time   NA 142 01/20/2023 0817   NA 147 (H) 08/06/2017 0816   NA 144 02/04/2017 0758   K 4.3 01/20/2023 0817   K 4.6 08/06/2017 0816   K 4.6 02/04/2017 0758   CL 104 01/20/2023 0817   CL 104 08/06/2017 0816   CO2 28 01/20/2023 0817   CO2 30 08/06/2017 0816   CO2 25 02/04/2017 0758   BUN 11 01/20/2023 0817   BUN 12 08/06/2017 0816   BUN 14.2 02/04/2017 0758   CREATININE 1.68 (H) 01/20/2023 0817   CREATININE 0.9 08/06/2017 0816   CREATININE 0.9 02/04/2017 0758      Component Value Date/Time   CALCIUM 9.2 01/20/2023 0817   CALCIUM 9.0 08/06/2017 0816   CALCIUM 8.9 02/04/2017 0758   ALKPHOS 131 (H)  01/20/2023 0817   ALKPHOS 108 (H) 08/06/2017 0816   ALKPHOS 113 02/04/2017 0758   AST 23 01/20/2023 0817   AST 24 02/04/2017 0758   ALT 18 01/20/2023 0817   ALT 27 08/06/2017 0816   ALT 20 02/04/2017 0758   BILITOT 0.9 01/20/2023 0817  BILITOT 0.91 02/04/2017 0758       Impression and Plan: Mr. Jackett is 52 yo [place] gentleman returns for a follow up for history of multiple maglinancies in the setting of Lynch syndrome.  I do not see any problems with recurrence of his past small bowel adenocarcinoma nor the left ureteral cancer.  He also has had stomach cancer.  I told him that he really needs to see his family doctor about his blood pressure.  I offered to put him on some blood pressure medication.  He said he would wait until he sees his family doctor.  He says he probably has not seen her for a few years.  He sees Dr. Berneice Heinrich of Urology next month.  His CEA level is pending.  This is what we usually follow as far as getting scans.  I do not see that we have to do scans on him in the near future.  I will plan to see him back after Summer is completed.  Hopefully, he will still go to [place].   Josph Macho, MD 5/15/20249:30 AM

## 2023-02-09 ENCOUNTER — Ambulatory Visit: Payer: Managed Care, Other (non HMO) | Admitting: Family Medicine

## 2023-05-24 ENCOUNTER — Inpatient Hospital Stay (HOSPITAL_BASED_OUTPATIENT_CLINIC_OR_DEPARTMENT_OTHER): Payer: Managed Care, Other (non HMO) | Admitting: Hematology & Oncology

## 2023-05-24 ENCOUNTER — Inpatient Hospital Stay: Payer: Managed Care, Other (non HMO) | Attending: Hematology & Oncology

## 2023-05-24 VITALS — BP 153/95 | HR 61 | Temp 98.0°F | Resp 17 | Ht 66.0 in | Wt 125.0 lb

## 2023-05-24 DIAGNOSIS — Z86718 Personal history of other venous thrombosis and embolism: Secondary | ICD-10-CM | POA: Insufficient documentation

## 2023-05-24 DIAGNOSIS — Z923 Personal history of irradiation: Secondary | ICD-10-CM | POA: Insufficient documentation

## 2023-05-24 DIAGNOSIS — C689 Malignant neoplasm of urinary organ, unspecified: Secondary | ICD-10-CM | POA: Diagnosis not present

## 2023-05-24 DIAGNOSIS — Z85068 Personal history of other malignant neoplasm of small intestine: Secondary | ICD-10-CM | POA: Diagnosis not present

## 2023-05-24 DIAGNOSIS — Z1509 Genetic susceptibility to other malignant neoplasm: Secondary | ICD-10-CM | POA: Diagnosis not present

## 2023-05-24 DIAGNOSIS — Z8554 Personal history of malignant neoplasm of ureter: Secondary | ICD-10-CM | POA: Insufficient documentation

## 2023-05-24 DIAGNOSIS — C160 Malignant neoplasm of cardia: Secondary | ICD-10-CM | POA: Diagnosis not present

## 2023-05-24 DIAGNOSIS — Z85028 Personal history of other malignant neoplasm of stomach: Secondary | ICD-10-CM | POA: Insufficient documentation

## 2023-05-24 DIAGNOSIS — C178 Malignant neoplasm of overlapping sites of small intestine: Secondary | ICD-10-CM | POA: Diagnosis not present

## 2023-05-24 DIAGNOSIS — Z9221 Personal history of antineoplastic chemotherapy: Secondary | ICD-10-CM | POA: Diagnosis not present

## 2023-05-24 DIAGNOSIS — C662 Malignant neoplasm of left ureter: Secondary | ICD-10-CM | POA: Diagnosis present

## 2023-05-24 DIAGNOSIS — Z08 Encounter for follow-up examination after completed treatment for malignant neoplasm: Secondary | ICD-10-CM | POA: Diagnosis present

## 2023-05-24 LAB — CMP (CANCER CENTER ONLY)
ALT: 17 U/L (ref 0–44)
AST: 21 U/L (ref 15–41)
Albumin: 4 g/dL (ref 3.5–5.0)
Alkaline Phosphatase: 115 U/L (ref 38–126)
Anion gap: 6 (ref 5–15)
BUN: 14 mg/dL (ref 6–20)
CO2: 28 mmol/L (ref 22–32)
Calcium: 9.1 mg/dL (ref 8.9–10.3)
Chloride: 109 mmol/L (ref 98–111)
Creatinine: 1.57 mg/dL — ABNORMAL HIGH (ref 0.61–1.24)
GFR, Estimated: 52 mL/min — ABNORMAL LOW (ref >60)
Glucose, Bld: 96 mg/dL (ref 70–99)
Potassium: 5.1 mmol/L (ref 3.5–5.1)
Sodium: 143 mmol/L (ref 135–145)
Total Bilirubin: 0.6 mg/dL (ref 0.3–1.2)
Total Protein: 6.4 g/dL — ABNORMAL LOW (ref 6.5–8.1)

## 2023-05-24 LAB — CEA (IN HOUSE-CHCC): CEA (CHCC-In House): 5.65 ng/mL — ABNORMAL HIGH (ref 0.00–5.00)

## 2023-05-24 LAB — CBC WITH DIFFERENTIAL (CANCER CENTER ONLY)
Abs Immature Granulocytes: 0.01 10*3/uL (ref 0.00–0.07)
Basophils Absolute: 0.1 10*3/uL (ref 0.0–0.1)
Basophils Relative: 1 %
Eosinophils Absolute: 0.4 10*3/uL (ref 0.0–0.5)
Eosinophils Relative: 7 %
HCT: 39.1 % (ref 39.0–52.0)
Hemoglobin: 12.9 g/dL — ABNORMAL LOW (ref 13.0–17.0)
Immature Granulocytes: 0 %
Lymphocytes Relative: 30 %
Lymphs Abs: 1.6 10*3/uL (ref 0.7–4.0)
MCH: 29.8 pg (ref 26.0–34.0)
MCHC: 33 g/dL (ref 30.0–36.0)
MCV: 90.3 fL (ref 80.0–100.0)
Monocytes Absolute: 0.3 10*3/uL (ref 0.1–1.0)
Monocytes Relative: 6 %
Neutro Abs: 2.9 10*3/uL (ref 1.7–7.7)
Neutrophils Relative %: 56 %
Platelet Count: 143 10*3/uL — ABNORMAL LOW (ref 150–400)
RBC: 4.33 MIL/uL (ref 4.22–5.81)
RDW: 13.5 % (ref 11.5–15.5)
WBC Count: 5.2 10*3/uL (ref 4.0–10.5)
nRBC: 0 % (ref 0.0–0.2)

## 2023-05-24 LAB — LACTATE DEHYDROGENASE: LDH: 147 U/L (ref 98–192)

## 2023-05-24 NOTE — Progress Notes (Signed)
Hematology and Oncology Follow Up Visit  Keith [name] [id number] Jan 05, 1971 52 y.o. 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  The big news is that he and his family will be going to [place], [place] for Christmas.  I know that they will have a wonderful time there.  I am happy that he is able to go.  He feels well.  He is eating well.  He has had no problems with nausea or vomiting.  He has had no fever.  He has had no problems with COVID.  There is been no problems with bleeding.  He has had no change in bowel or bladder habits.  He is still quite busy at work.  He is exercising.  He runs.  His last CEA was 4.7.  Overall, I would say that his performance status is probably ECOG 0.   Medications:  Allergies as of 05/24/2023   No Known Allergies      Medication List        Accurate as of May 24, 2023  9:05 AM. If you have any questions, ask your nurse or doctor.          STOP taking these medications    pantoprazole 40 MG tablet Commonly known as: PROTONIX Stopped by: Josph Macho       TAKE these medications    acetaminophen 325 MG tablet Commonly known as: TYLENOL Take 650 mg by mouth every 6 (six) hours as needed for headache.   carboxymethylcellulose 0.5 % Soln Commonly known as: REFRESH PLUS Place 1 drop into both eyes 3 (three) times daily as needed (dry/irritated  eyes.).   co-enzyme Q-10 30 MG capsule Take 30 mg by mouth as needed. 08/07/2020 Takes 2 times/weekly.   lipase/protease/amylase 95188 UNITS Cpep capsule Commonly known as: Creon Take 1 capsule (36,000 Units total) by mouth 3 (three) times daily with meals.   VITAMIN B-12 PO Take by mouth daily.   VITAMIN D (ERGOCALCIFEROL) PO Take by mouth daily. 01/19/2022 Patient does not know dose.        Allergies: No Known Allergies  Past Medical History, Surgical history, Social history, and Family History were reviewed and updated.  Review of Systems: Review of Systems  Constitutional:  Positive for malaise/fatigue.  HENT: Negative.    Eyes: Negative.   Respiratory: Negative.    Cardiovascular: Negative.   Gastrointestinal: Negative.   Genitourinary: Negative.   Musculoskeletal: Negative.   Skin:  Positive for rash.  Neurological: Negative.   Endo/Heme/Allergies: Negative.   Psychiatric/Behavioral: Negative.       Physical Exam:  height is 5\' 6"  (1.676 m) and weight is 125 lb (56.7 kg). His oral temperature is 98 F (36.7 C). His blood pressure is 153/95 (abnormal) and his pulse is 61. His respiration is 17 and oxygen saturation is 100%.   Wt Readings from Last 3 Encounters:  05/24/23  125 lb (56.7 kg)  01/20/23 126 lb 1.9 oz (57.2 kg)  10/12/22 131 lb (59.4 kg)    Physical Exam Vitals reviewed.  HENT:     Head: Normocephalic and atraumatic.  Eyes:     Pupils: Pupils are equal, round, and reactive to light.  Cardiovascular:     Rate and Rhythm: Normal rate and regular rhythm.     Heart sounds: Normal heart sounds.  Pulmonary:     Effort: Pulmonary effort is normal.     Breath sounds: Normal breath sounds.  Abdominal:     General: Bowel sounds are normal.     Palpations: Abdomen is soft.     Comments: Abdominal exam shows a well-healed laparotomy scar.  He has no masses.  There is no fluid wave.  There is no palpable hepatosplenomegaly.  Musculoskeletal:         General: No tenderness or deformity. Normal range of motion.     Cervical back: Normal range of motion.  Lymphadenopathy:     Cervical: No cervical adenopathy.  Skin:    General: Skin is warm and dry.     Findings: No erythema or rash.     Comments: In the right suprapubic area, there is a nodular dark lesion.  It probably measures about 3 x 3 mm.  It is nontender.  Neurological:     Mental Status: He is alert and oriented to person, place, and time.  Psychiatric:        Behavior: Behavior normal.        Thought Content: Thought content normal.        Judgment: Judgment normal.      Lab Results  Component Value Date   WBC 5.2 05/24/2023   HGB 12.9 (L) 05/24/2023   HCT 39.1 05/24/2023   MCV 90.3 05/24/2023   PLT 143 (L) 05/24/2023   Lab Results  Component Value Date   FERRITIN 20 (L) 05/22/2021   IRON 71 05/22/2021   TIBC 384 05/22/2021   UIBC 313 05/22/2021   IRONPCTSAT 18 (L) 05/22/2021   Lab Results  Component Value Date   RETICCTPCT 1.6 05/22/2021   RBC 4.33 05/24/2023   No results found for: "KPAFRELGTCHN", "LAMBDASER", "KAPLAMBRATIO" No results found for: "IGGSERUM", "IGA", "IGMSERUM" No results found for: "TOTALPROTELP", "ALBUMINELP", "A1GS", "A2GS", "BETS", "BETA2SER", "GAMS", "MSPIKE", "SPEI"   Chemistry      Component Value Date/Time   NA 142 01/20/2023 0817   NA 147 (H) 08/06/2017 0816   NA 144 02/04/2017 0758   K 4.3 01/20/2023 0817   K 4.6 08/06/2017 0816   K 4.6 02/04/2017 0758   CL 104 01/20/2023 0817   CL 104 08/06/2017 0816   CO2 28 01/20/2023 0817   CO2 30 08/06/2017 0816   CO2 25 02/04/2017 0758   BUN 11 01/20/2023 0817   BUN 12 08/06/2017 0816   BUN 14.2 02/04/2017 0758   CREATININE 1.68 (H) 01/20/2023 0817   CREATININE 0.9 08/06/2017 0816   CREATININE 0.9 02/04/2017 0758      Component Value Date/Time   CALCIUM 9.2 01/20/2023 0817   CALCIUM 9.0 08/06/2017 0816   CALCIUM 8.9 02/04/2017 0758   ALKPHOS 131 (H) 01/20/2023 0817    ALKPHOS 108 (H) 08/06/2017 0816   ALKPHOS 113 02/04/2017 0758   AST 23 01/20/2023 0817   AST 24 02/04/2017 0758   ALT 18 01/20/2023 0817   ALT 27 08/06/2017 0816   ALT 20 02/04/2017 0758   BILITOT 0.9 01/20/2023 0817  BILITOT 0.91 02/04/2017 0758       Impression and Plan: Keith [name] is 52 yo [place] gentleman returns for a follow up for history of multiple maglinancies in the setting of Lynch syndrome.  I do not see any problems with recurrence of his past small bowel adenocarcinoma nor the left ureteral cancer.  He also has had stomach cancer.  Again, the diastolic blood pressure is on the high side.  I told him that he really needs to see his family doctor to have this manage.  We will see about doing a CT scan on him before we see him back.  We will see him back in mid December, before he goes down to [place] for Christmas.  I am just happy that so far, there is been no evidence of cancer recurrence.   Josph Macho, MD 9/16/20249:05 AM

## 2023-06-21 ENCOUNTER — Encounter: Payer: Self-pay | Admitting: Family Medicine

## 2023-06-21 ENCOUNTER — Ambulatory Visit: Payer: Managed Care, Other (non HMO) | Admitting: Family Medicine

## 2023-06-21 VITALS — BP 130/80 | HR 60 | Temp 98.5°F | Resp 12 | Ht 66.0 in | Wt 125.2 lb

## 2023-06-21 DIAGNOSIS — I10 Essential (primary) hypertension: Secondary | ICD-10-CM

## 2023-06-21 DIAGNOSIS — Z1329 Encounter for screening for other suspected endocrine disorder: Secondary | ICD-10-CM

## 2023-06-21 DIAGNOSIS — Z130 Encounter for screening for diseases of the blood and blood-forming organs and certain disorders involving the immune mechanism: Secondary | ICD-10-CM

## 2023-06-21 DIAGNOSIS — Z1159 Encounter for screening for other viral diseases: Secondary | ICD-10-CM | POA: Diagnosis not present

## 2023-06-21 DIAGNOSIS — Z0000 Encounter for general adult medical examination without abnormal findings: Secondary | ICD-10-CM | POA: Insufficient documentation

## 2023-06-21 DIAGNOSIS — D696 Thrombocytopenia, unspecified: Secondary | ICD-10-CM

## 2023-06-21 DIAGNOSIS — C779 Secondary and unspecified malignant neoplasm of lymph node, unspecified: Secondary | ICD-10-CM | POA: Diagnosis not present

## 2023-06-21 DIAGNOSIS — Z13228 Encounter for screening for other metabolic disorders: Secondary | ICD-10-CM

## 2023-06-21 DIAGNOSIS — Z13220 Encounter for screening for lipoid disorders: Secondary | ICD-10-CM

## 2023-06-21 NOTE — Assessment & Plan Note (Signed)
We discussed the importance of regular physical activity and healthy diet for prevention of chronic illness and/or complications. Preventive guidelines reviewed. Vaccination: Declined vaccination. Next CPE in a year.

## 2023-06-21 NOTE — Patient Instructions (Addendum)
A few things to remember from today's visit:  Routine general medical examination at a health care facility  Encounter for HCV screening test for low risk patient - Plan: Hepatitis C antibody  Essential hypertension, benign  Screening for lipoid disorders - Plan: Lipid panel  Screening for endocrine, metabolic and immunity disorder - Plan: Hemoglobin A1c  Monitor blood pressure at home , goal under 130/80. I am adding labs to be done, if possible, at your oncologist's office.  If you need refills for medications you take chronically, please call your pharmacy. Do not use My Chart to request refills or for acute issues that need immediate attention. If you send a my chart message, it may take a few days to be addressed, specially if I am not in the office.  Please be sure medication list is accurate. If a new problem present, please set up appointment sooner than planned today.  Health Maintenance, Male Adopting a healthy lifestyle and getting preventive care are important in promoting health and wellness. Ask your health care provider about: The right schedule for you to have regular tests and exams. Things you can do on your own to prevent diseases and keep yourself healthy. What should I know about diet, weight, and exercise? Eat a healthy diet  Eat a diet that includes plenty of vegetables, fruits, low-fat dairy products, and lean protein. Do not eat a lot of foods that are high in solid fats, added sugars, or sodium. Maintain a healthy weight Body mass index (BMI) is a measurement that can be used to identify possible weight problems. It estimates body fat based on height and weight. Your health care provider can help determine your BMI and help you achieve or maintain a healthy weight. Get regular exercise Get regular exercise. This is one of the most important things you can do for your health. Most adults should: Exercise for at least 150 minutes each week. The exercise  should increase your heart rate and make you sweat (moderate-intensity exercise). Do strengthening exercises at least twice a week. This is in addition to the moderate-intensity exercise. Spend less time sitting. Even light physical activity can be beneficial. Watch cholesterol and blood lipids Have your blood tested for lipids and cholesterol at 54 years of age, then have this test every 5 years. You may need to have your cholesterol levels checked more often if: Your lipid or cholesterol levels are high. You are older than 54 years of age. You are at high risk for heart disease. What should I know about cancer screening? Many types of cancers can be detected early and may often be prevented. Depending on your health history and family history, you may need to have cancer screening at various ages. This may include screening for: Colorectal cancer. Prostate cancer. Skin cancer. Lung cancer. What should I know about heart disease, diabetes, and high blood pressure? Blood pressure and heart disease High blood pressure causes heart disease and increases the risk of stroke. This is more likely to develop in people who have high blood pressure readings or are overweight. Talk with your health care provider about your target blood pressure readings. Have your blood pressure checked: Every 3-5 years if you are 47-74 years of age. Every year if you are 19 years old or older. If you are between the ages of 77 and 32 and are a current or former smoker, ask your health care provider if you should have a one-time screening for abdominal aortic aneurysm (AAA). Diabetes  Have regular diabetes screenings. This checks your fasting blood sugar level. Have the screening done: Once every three years after age 65 if you are at a normal weight and have a low risk for diabetes. More often and at a younger age if you are overweight or have a high risk for diabetes. What should I know about preventing  infection? Hepatitis B If you have a higher risk for hepatitis B, you should be screened for this virus. Talk with your health care provider to find out if you are at risk for hepatitis B infection. Hepatitis C Blood testing is recommended for: Everyone born from [birth year] through 1967. Anyone with known risk factors for hepatitis C. Sexually transmitted infections (STIs) You should be screened each year for STIs, including gonorrhea and chlamydia, if: You are sexually active and are younger than 54 years of age. You are older than 54 years of age and your health care provider tells you that you are at risk for this type of infection. Your sexual activity has changed since you were last screened, and you are at increased risk for chlamydia or gonorrhea. Ask your health care provider if you are at risk. Ask your health care provider about whether you are at high risk for HIV. Your health care provider may recommend a prescription medicine to help prevent HIV infection. If you choose to take medicine to prevent HIV, you should first get tested for HIV. You should then be tested every 3 months for as long as you are taking the medicine. Follow these instructions at home: Alcohol use Do not drink alcohol if your health care provider tells you not to drink. If you drink alcohol: Limit how much you have to 0-2 drinks a day. Know how much alcohol is in your drink. In the [place] one drink equals one 12 oz bottle of beer (355 mL), one 5 oz glass of wine (148 mL), or one 1 oz glass of hard liquor (44 mL). Lifestyle Do not use any products that contain nicotine or tobacco. These products include cigarettes, chewing tobacco, and vaping devices, such as e-cigarettes. If you need help quitting, ask your health care provider. Do not use street drugs. Do not share needles. Ask your health care provider for help if you need support or information about quitting drugs. General instructions Schedule regular health,  dental, and eye exams. Stay current with your vaccines. Tell your health care provider if: You often feel depressed. You have ever been abused or do not feel safe at home. Summary Adopting a healthy lifestyle and getting preventive care are important in promoting health and wellness. Follow your health care provider's instructions about healthy diet, exercising, and getting tested or screened for diseases. Follow your health care provider's instructions on monitoring your cholesterol and blood pressure. This information is not intended to replace advice given to you by your health care provider. Make sure you discuss any questions you have with your health care provider. Document Revised: 01/13/2021 Document Reviewed: 01/13/2021 Elsevier Patient Education  2024 [employer].

## 2023-06-21 NOTE — Assessment & Plan Note (Signed)
BP adequately controlled. Continue nonpharmacologic treatment. Recommend monitoring BP at home.

## 2023-06-21 NOTE — Assessment & Plan Note (Signed)
Mild and stable. Has had periodic CBC done at his oncologist's office.

## 2023-06-21 NOTE — Progress Notes (Signed)
HPI: Keith [name] is a 52 y.o.male with a PMHx significant for HTN, MSH2-related lynch synd, gastric cancer, urothelial cancer, GERD, IFG,and chronic hepatitis B here today for his routine physical examination.  Last CPE: More than one year ago He has seen oncology since his last visit.    Exercise: He runs for about 30 minutes once per week.  Diet: He says he is cooking at home, eating vegetables daily, and eating mainly red meat.  Sleep: He reports he sleeps around 7 hours per night.  Alcohol Use: He denies drinking alcohol.  Smoking: He quit smoking in 09/2013. Vision: UTD on routine vision care. He has appointments once per year.  Dental: UTD on routine dental care.   Immunization History  Administered Date(s) Administered   Influenza,inj,Quad PF,6+ Mos 05/24/2014   Pneumococcal Polysaccharide-23 12/07/2012    Health Maintenance  Topic Date Due   Hepatitis C Screening  Never done   DTaP/Tdap/Td (1 - Tdap) Never done   COVID-19 Vaccine (1) 07/07/2023 (Originally [birth year]1974)   Zoster Vaccines- Shingrix (1 of 2) 09/21/2023 (Originally 07/30/1988)   INFLUENZA VACCINE  12/06/2023 (Originally 04/08/2023)   HIV Screening  06/20/2028 (Originally 07/30/1984)   Colonoscopy  01/02/2025   HPV VACCINES  Aged Out   Last prostate ca screening: UTD. He sees urology yearly.   Chronic medical problems:  Hypertension:  Medications: He is not currently on medication for hypertension. BP readings at home: He has not been checking at home.  Negative for unusual or severe headache, visual changes, exertional chest pain, dyspnea,  focal weakness, or edema.  Lab Results  Component Value Date   CREATININE 1.57 (H) 05/24/2023   BUN 14 05/24/2023   NA 143 05/24/2023   K 5.1 05/24/2023   CL 109 05/24/2023   CO2 28 05/24/2023   Lab Results  Component Value Date   HGBA1C 5.9 (H) 10/24/2019   Lab Results  Component Value Date   CHOL 136 11/30/2012   HDL 41 11/30/2012   LDLCALC 74  11/30/2012   TRIG 107 11/30/2012   CHOLHDL 3.3 11/30/2012   He has no concerns today.  Review of Systems  Constitutional:  Negative for activity change, appetite change and fever.  HENT:  Negative for nosebleeds, sore throat and trouble swallowing.   Eyes:  Negative for redness and visual disturbance.  Respiratory:  Negative for cough, shortness of breath and wheezing.   Cardiovascular:  Negative for chest pain, palpitations and leg swelling.  Gastrointestinal:  Negative for abdominal pain, blood in stool, nausea and vomiting.  Endocrine: Negative for cold intolerance, heat intolerance, polydipsia, polyphagia and polyuria.  Genitourinary:  Negative for decreased urine volume, dysuria, genital sores, hematuria and testicular pain.  Musculoskeletal:  Negative for gait problem and myalgias.  Skin:  Negative for color change and rash.  Allergic/Immunologic: Negative for environmental allergies.  Neurological:  Negative for syncope, weakness and headaches.  Psychiatric/Behavioral:  Negative for confusion and sleep disturbance. The patient is not nervous/anxious.   All other systems reviewed and are negative.  Current Outpatient Medications on File Prior to Visit  Medication Sig Dispense Refill   acetaminophen (TYLENOL) 325 MG tablet Take 650 mg by mouth every 6 (six) hours as needed for headache.     carboxymethylcellulose (REFRESH PLUS) 0.5 % SOLN Place 1 drop into both eyes 3 (three) times daily as needed (dry/irritated eyes.).      co-enzyme Q-10 30 MG capsule Take 30 mg by mouth as needed. 08/07/2020 Takes 2 times/weekly.  Cyanocobalamin (VITAMIN B-12 PO) Take by mouth daily.     lipase/protease/amylase (CREON) 36000 UNITS CPEP capsule Take 1 capsule (36,000 Units total) by mouth 3 (three) times daily with meals. 90 capsule 11   VITAMIN D, ERGOCALCIFEROL, PO Take by mouth daily. 01/19/2022 Patient does not know dose.     No current facility-administered medications on file prior to  visit.   Past Medical History:  Diagnosis Date   Acute thrombosis of cephalic vein, right 03/25/2020   Blood transfusion without reported diagnosis    Cancer of body of stomach (HCC) 10/25/2020   Cancer of small intestine, including duodenum (HCC) 10/26/2013   small intestine adenocarcinoma stage IIIc    CHF (congestive heart failure) (HCC)    patient denies   Chronic hepatitis B (HCC)    Diabetes mellitus without complication (HCC)    patient denies   Family history of cancer of genitourinary system    Family history of esophageal cancer    GERD (gastroesophageal reflux disease)    remote history   Hydronephrosis of left kidney    Hypertension    no medications at this time   Impaired fasting glucose    Lung nodules    2 mm nodule in the posterior left lower lobe (4/115), stable. 4 mm ground-glass nodule in the apical segment right upper lobe (4/30), unchanged as well.   Platelets decreased (HCC)    Radiation 01/31/14-03/06/14   upper abdomen/surgical bed   Urothelial cancer (HCC) 12/20/2019    Past Surgical History:  Procedure Laterality Date   ABDOMINAL EXPLORATION SURGERY  09/19/2013   done in [place]   APPENDECTOMY  1997   BACK SURGERY     tumor excision on back   COLONOSCOPY  2015   CYSTOSCOPY WITH RETROGRADE PYELOGRAM, URETEROSCOPY AND STENT PLACEMENT Left 10/26/2019   Procedure: CYSTOSCOPY WITH LEFT RETROGRADE PYELOGRAM, URETEROSCOPY;  Surgeon: Marcine Matar, MD;  Location: [facility];  Service: Urology;  Laterality: Left;  1 HR   PANCREATICODUODENECTOMY  09/19/2013   PARTIAL GASTRECTOMY  09/19/2013   UPPER GI ENDOSCOPY     XI ROBOTICALLY ASSISTED LAPAROSCOPIC URETERAL RE-IMPLANTATION   12/06/2019    No Known Allergies  Family History  Problem Relation Age of Onset   Cancer Mother 55       [place]   Hypertension Other    Colon cancer Sister 74   Esophageal cancer Father 71   Colon cancer Cousin 68       paternal first cousin   Cancer Sister 9       [place]    Rectal cancer Neg Hx    Stomach cancer Neg Hx     Social History   Socioeconomic History   Marital status: Married    Spouse name: [name]    Number of children: 2   Years of education: 16   Highest education level: Not on file  Occupational History   Occupation: TECHNICAL SUPPORT     Employer: [employer]    Comment: [employer]   Tobacco Use   Smoking status: Former    Current packs/day: 0.00    Average packs/day: 0.3 packs/day for 27.0 years (6.8 ttl pk-yrs)    Types: Cigarettes    Start date: 09/07/1986    Quit date: 09/13/2013    Years since quitting: 9.7   Smokeless tobacco: Never   Tobacco comments:    quit January of 2015  Vaping Use   Vaping status: Never Used  Substance and Sexual Activity   Alcohol  use: No    Alcohol/week: 0.0 standard drinks of alcohol    Comment: He drinks rarely    Drug use: No   Sexual activity: Yes    Birth control/protection: Surgical    Comment: Wife had a BTL  Other Topics Concern   Not on file  Social History Narrative   Marital Status: Married Malachi Bonds)   Children:  Son Joelene Millin), Daughter Zollie Scale)    Pets: None   Living Situation: Lives with spouse, son and daughter    Occupation: Social research officer, 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  He has smoked at least 20 years.     Alcohol Use:  Rarely    Exercise:  Limited    Hobbies: Music            Social Determinants of Health   Financial Resource Strain: Not on file  Food Insecurity: Not on file  Transportation Needs: Not on file  Physical Activity: Not on file  Stress: Not on file  Social Connections: Not on file   Vitals:   06/21/23 0858  BP: 130/80  Pulse: 60  Resp: 12  Temp: 98.5 F (36.9 C)  SpO2: 99%   Body mass index is 20.22 kg/m.  Wt Readings from Last 3 Encounters:  06/21/23 125 lb 4 oz (56.8 kg)  05/24/23 125 lb (56.7 kg)  01/20/23 126 lb 1.9 oz (57.2 kg)    Physical Exam Vitals and nursing note reviewed.  Constitutional:      General: He is not in acute distress.    Appearance: He is well-developed.  HENT:     Head: Normocephalic and atraumatic.     Right Ear: Tympanic membrane, ear canal and external ear normal.     Left Ear: Tympanic membrane, ear canal and external ear normal.     Mouth/Throat:     Mouth: Mucous membranes are moist.     Pharynx: Oropharynx is clear.  Eyes:     Extraocular Movements: Extraocular movements intact.     Conjunctiva/sclera: Conjunctivae normal.     Pupils: Pupils are equal, round, and reactive to light.  Neck:     Thyroid: No thyroid mass or thyromegaly.  Cardiovascular:     Rate and Rhythm: Normal rate and regular rhythm.     Pulses:          Dorsalis pedis pulses are 2+ on the right side and 2+ on the left side.     Heart sounds: No murmur heard. Pulmonary:     Effort: Pulmonary effort is normal. No respiratory distress.     Breath sounds: Normal breath sounds.  Abdominal:     Palpations: Abdomen is soft. There is no hepatomegaly or mass.     Tenderness: There is no abdominal tenderness.  Genitourinary:    Comments: No concerns. Musculoskeletal:        General: No tenderness.     Cervical back: Normal range of motion.     Comments: No major deformities appreciated and no signs of synovitis.  Lymphadenopathy:     Cervical: No cervical adenopathy.     Upper Body:     Right upper body: No supraclavicular adenopathy.     Left upper body: No supraclavicular adenopathy.  Skin:    General: Skin is warm.     Findings: No erythema.  Neurological:     General: No focal deficit present.     Mental Status:  He is alert and oriented to person, place, and time.     Cranial Nerves: No cranial nerve deficit.     Sensory: No sensory deficit.     Gait: Gait normal.     Deep Tendon Reflexes:     Reflex Scores:      Bicep reflexes are 2+ on the right side and 2+ on the left side.      Patellar  reflexes are 2+ on the right side and 2+ on the left side. Psychiatric:        Mood and Affect: Mood and affect normal.   ASSESSMENT AND PLAN:  Mr. Maston was seen today for his routine annual physical examination.   Orders Placed This Encounter  Procedures   Hemoglobin A1c   Hepatitis C antibody   Lipid panel   Routine general medical examination at a health care facility Assessment & Plan: We discussed the importance of regular physical activity and healthy diet for prevention of chronic illness and/or complications. Preventive guidelines reviewed. Vaccination: Declined vaccination. Next CPE in a year.   Encounter for HCV screening test for low risk patient -     Hepatitis C antibody; Future  Essential hypertension, benign Assessment & Plan: BP adequately controlled. Continue nonpharmacologic treatment. Recommend monitoring BP at home.   Screening for lipoid disorders -     Lipid panel; Future  Screening for endocrine, metabolic and immunity disorder -     Hemoglobin A1c; Future  Thrombocytopenia (HCC) Assessment & Plan: Mild and stable. Has had periodic CBC done at his oncologist's office.   Secondary and unspecified malignant neoplasm of lymph node, unspecified (HCC) Assessment & Plan: History of multiple maglinancies in the setting of Lynch syndrome. Hx of small bowel adenocarcinoma,left ureteral cancer, and gastric cancer. He follows regularly with oncology and urologist  He has periodic abdominal and chest CT as well as EGD and colonoscopy q 2 years.   Lab orders placed, so can be added to next blood work at his oncologist's office.  Return in 1 year (on 06/20/2024) for CPE.  I, Rolla Etienne Wierda, acting as a scribe for Stephie Xu [place], MD., have documented all relevant documentation on the behalf of Kylil Swopes [place], MD, as directed by  Tomeika Weinmann [place], MD while in the presence of Aybree Lanyon [place], MD.   I, Maahi Lannan [place], MD, have reviewed all documentation for this  visit. The documentation on 06/21/23 for the exam, diagnosis, procedures, and orders are all accurate and complete.  Atalie Oros G. [place], MD  [facility]. [place] office.

## 2023-06-21 NOTE — Assessment & Plan Note (Addendum)
History of multiple maglinancies in the setting of Lynch syndrome. Hx of small bowel adenocarcinoma,left ureteral cancer, and gastric cancer. He follows regularly with oncology and urologist  He has periodic abdominal and chest CT as well as EGD and colonoscopy q 2 years.

## 2023-08-16 ENCOUNTER — Ambulatory Visit (HOSPITAL_BASED_OUTPATIENT_CLINIC_OR_DEPARTMENT_OTHER): Payer: Managed Care, Other (non HMO)

## 2023-08-23 ENCOUNTER — Other Ambulatory Visit: Payer: Managed Care, Other (non HMO)

## 2023-08-23 ENCOUNTER — Ambulatory Visit: Payer: Managed Care, Other (non HMO) | Admitting: Hematology & Oncology

## 2023-08-27 ENCOUNTER — Inpatient Hospital Stay: Payer: Managed Care, Other (non HMO)

## 2023-08-27 ENCOUNTER — Ambulatory Visit: Payer: Managed Care, Other (non HMO) | Admitting: Hematology & Oncology

## 2023-09-16 ENCOUNTER — Ambulatory Visit (HOSPITAL_BASED_OUTPATIENT_CLINIC_OR_DEPARTMENT_OTHER)
Admission: RE | Admit: 2023-09-16 | Discharge: 2023-09-16 | Disposition: A | Discharge status: Home / Self Care | Payer: Managed Care, Other (non HMO) | Source: Ambulatory Visit | Attending: Hematology & Oncology | Admitting: Hematology & Oncology

## 2023-09-16 ENCOUNTER — Telehealth: Payer: Self-pay

## 2023-09-16 DIAGNOSIS — C160 Malignant neoplasm of cardia: Secondary | ICD-10-CM | POA: Insufficient documentation

## 2023-09-16 DIAGNOSIS — C689 Malignant neoplasm of urinary organ, unspecified: Secondary | ICD-10-CM | POA: Diagnosis present

## 2023-09-16 DIAGNOSIS — C178 Malignant neoplasm of overlapping sites of small intestine: Secondary | ICD-10-CM | POA: Diagnosis present

## 2023-09-16 MED ORDER — IOHEXOL 300 MG/ML  SOLN
100.0000 mL | Freq: Once | INTRAMUSCULAR | Status: AC | PRN
  Administered 2023-09-16: 100 mL via INTRAVENOUS

## 2023-09-16 NOTE — Telephone Encounter (Signed)
 Advised via MyChart.

## 2023-09-16 NOTE — Telephone Encounter (Signed)
-----   Message from Josph Macho sent at 09/16/2023  2:10 PM EST ----- Please call and let him know that the CT scan looks fine.  There is no obvious metastatic or recurrent cancer.  Thanks.  Cindee Lame

## 2023-09-30 ENCOUNTER — Inpatient Hospital Stay: Payer: Managed Care, Other (non HMO) | Attending: Hematology & Oncology

## 2023-09-30 ENCOUNTER — Inpatient Hospital Stay: Payer: Managed Care, Other (non HMO) | Admitting: Hematology & Oncology

## 2023-09-30 ENCOUNTER — Other Ambulatory Visit: Payer: Self-pay

## 2023-09-30 ENCOUNTER — Encounter: Payer: Self-pay | Admitting: Hematology & Oncology

## 2023-09-30 VITALS — BP 143/87 | HR 73 | Temp 98.1°F | Resp 16 | Ht 66.0 in | Wt 128.0 lb

## 2023-09-30 DIAGNOSIS — Z1509 Genetic susceptibility to other malignant neoplasm: Secondary | ICD-10-CM | POA: Insufficient documentation

## 2023-09-30 DIAGNOSIS — C161 Malignant neoplasm of fundus of stomach: Secondary | ICD-10-CM

## 2023-09-30 DIAGNOSIS — Z923 Personal history of irradiation: Secondary | ICD-10-CM | POA: Insufficient documentation

## 2023-09-30 DIAGNOSIS — C160 Malignant neoplasm of cardia: Secondary | ICD-10-CM

## 2023-09-30 DIAGNOSIS — Z9221 Personal history of antineoplastic chemotherapy: Secondary | ICD-10-CM | POA: Diagnosis not present

## 2023-09-30 DIAGNOSIS — Z85028 Personal history of other malignant neoplasm of stomach: Secondary | ICD-10-CM | POA: Diagnosis not present

## 2023-09-30 DIAGNOSIS — Z8554 Personal history of malignant neoplasm of ureter: Secondary | ICD-10-CM | POA: Insufficient documentation

## 2023-09-30 DIAGNOSIS — Z08 Encounter for follow-up examination after completed treatment for malignant neoplasm: Secondary | ICD-10-CM | POA: Insufficient documentation

## 2023-09-30 DIAGNOSIS — Z85068 Personal history of other malignant neoplasm of small intestine: Secondary | ICD-10-CM | POA: Diagnosis not present

## 2023-09-30 DIAGNOSIS — C178 Malignant neoplasm of overlapping sites of small intestine: Secondary | ICD-10-CM

## 2023-09-30 DIAGNOSIS — C689 Malignant neoplasm of urinary organ, unspecified: Secondary | ICD-10-CM

## 2023-09-30 DIAGNOSIS — Z86718 Personal history of other venous thrombosis and embolism: Secondary | ICD-10-CM | POA: Diagnosis not present

## 2023-09-30 LAB — CBC WITH DIFFERENTIAL (CANCER CENTER ONLY)
Abs Immature Granulocytes: 0.01 10*3/uL (ref 0.00–0.07)
Basophils Absolute: 0 10*3/uL (ref 0.0–0.1)
Basophils Relative: 1 %
Eosinophils Absolute: 0.6 10*3/uL — ABNORMAL HIGH (ref 0.0–0.5)
Eosinophils Relative: 11 %
HCT: 41.4 % (ref 39.0–52.0)
Hemoglobin: 14 g/dL (ref 13.0–17.0)
Immature Granulocytes: 0 %
Lymphocytes Relative: 25 %
Lymphs Abs: 1.3 10*3/uL (ref 0.7–4.0)
MCH: 30.6 pg (ref 26.0–34.0)
MCHC: 33.8 g/dL (ref 30.0–36.0)
MCV: 90.6 fL (ref 80.0–100.0)
Monocytes Absolute: 0.3 10*3/uL (ref 0.1–1.0)
Monocytes Relative: 6 %
Neutro Abs: 2.9 10*3/uL (ref 1.7–7.7)
Neutrophils Relative %: 57 %
Platelet Count: 115 10*3/uL — ABNORMAL LOW (ref 150–400)
RBC: 4.57 MIL/uL (ref 4.22–5.81)
RDW: 13.2 % (ref 11.5–15.5)
WBC Count: 5.2 10*3/uL (ref 4.0–10.5)
nRBC: 0 % (ref 0.0–0.2)

## 2023-09-30 LAB — CMP (CANCER CENTER ONLY)
ALT: 13 U/L (ref 0–44)
AST: 20 U/L (ref 15–41)
Albumin: 4.2 g/dL (ref 3.5–5.0)
Alkaline Phosphatase: 97 U/L (ref 38–126)
Anion gap: 5 (ref 5–15)
BUN: 17 mg/dL (ref 6–20)
CO2: 30 mmol/L (ref 22–32)
Calcium: 9 mg/dL (ref 8.9–10.3)
Chloride: 105 mmol/L (ref 98–111)
Creatinine: 1.49 mg/dL — ABNORMAL HIGH (ref 0.61–1.24)
GFR, Estimated: 55 mL/min — ABNORMAL LOW (ref >60)
Glucose, Bld: 192 mg/dL — ABNORMAL HIGH (ref 70–99)
Potassium: 5.2 mmol/L — ABNORMAL HIGH (ref 3.5–5.1)
Sodium: 140 mmol/L (ref 135–145)
Total Bilirubin: 1 mg/dL (ref 0.0–1.2)
Total Protein: 6.4 g/dL — ABNORMAL LOW (ref 6.5–8.1)

## 2023-09-30 LAB — LACTATE DEHYDROGENASE: LDH: 162 U/L (ref 98–192)

## 2023-09-30 NOTE — Progress Notes (Signed)
Hematology and Oncology Follow Up Visit  Keith [name] [id number] Dec 27, 1970 53 y.o. 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  He and his family had a wonderful trip over Christmas.  That she went all the way down to [place].  They had a wonderful time down there.  They her down for about 3 to 4 days.  Enjoyed himself.  He did have a CT scan last week.  The CT scan did not show any evidence of recurrent malignancy.  He has had no nausea or vomiting.  He has had no weight loss.  He is eating well.  He has had no change in bowel or bladder habits.  The been no issues with urinating.  He has had no leg swelling.  He has had no rashes.  He has had no cough or chest wall pain.  He had no headache.  His last CEA level back in September was 5.65.  Overall, I would say his performance status is probably ECOG 1.   Medications:  Allergies as of 09/30/2023   No Known Allergies      Medication List        Accurate as of September 30, 2023 10:53 AM. If you have any questions, ask your nurse or doctor.          acetaminophen 325 MG tablet Commonly known as: TYLENOL Take 650 mg by mouth every 6 (six) hours as needed for headache.   carboxymethylcellulose 0.5 % Soln Commonly known as: REFRESH PLUS Place 1 drop into both eyes 3 (three) times daily as needed (dry/irritated eyes.).   co-enzyme Q-10 30 MG  capsule Take 30 mg by mouth as needed. 08/07/2020 Takes 2 times/weekly.   lipase/protease/amylase 08657 UNITS Cpep capsule Commonly known as: Creon Take 1 capsule (36,000 Units total) by mouth 3 (three) times daily with meals.   VITAMIN B-12 PO Take by mouth daily.   VITAMIN D (ERGOCALCIFEROL) PO Take by mouth daily. 01/19/2022 Patient does not know dose.        Allergies: No Known Allergies  Past Medical History, Surgical history, Social history, and Family History were reviewed and updated.  Review of Systems: Review of Systems  Constitutional:  Positive for malaise/fatigue.  HENT: Negative.    Eyes: Negative.   Respiratory: Negative.    Cardiovascular: Negative.   Gastrointestinal: Negative.   Genitourinary: Negative.   Musculoskeletal: Negative.   Skin:  Positive for rash.  Neurological: Negative.   Endo/Heme/Allergies: Negative.   Psychiatric/Behavioral: Negative.       Physical Exam:  height is 5\' 6"  (1.676 m) and weight is 128 lb (58.1 kg). His oral temperature is 98.1 F (36.7 C). His blood pressure is 143/87 (abnormal) and his pulse is 73. His respiration is 16 and oxygen saturation is 99%.   Wt Readings from Last 3 Encounters:  09/30/23 128 lb (58.1 kg)  06/21/23  125 lb 4 oz (56.8 kg)  05/24/23 125 lb (56.7 kg)    Physical Exam Vitals reviewed.  HENT:     Head: Normocephalic and atraumatic.  Eyes:     Pupils: Pupils are equal, round, and reactive to light.  Cardiovascular:     Rate and Rhythm: Normal rate and regular rhythm.     Heart sounds: Normal heart sounds.  Pulmonary:     Effort: Pulmonary effort is normal.     Breath sounds: Normal breath sounds.  Abdominal:     General: Bowel sounds are normal.     Palpations: Abdomen is soft.     Comments: Abdominal exam shows a well-healed laparotomy scar.  He has no masses.  There is no fluid wave.  There is no palpable hepatosplenomegaly.  Musculoskeletal:        General: No tenderness or  deformity. Normal range of motion.     Cervical back: Normal range of motion.  Lymphadenopathy:     Cervical: No cervical adenopathy.  Skin:    General: Skin is warm and dry.     Findings: No erythema or rash.     Comments: In the right suprapubic area, there is a nodular dark lesion.  It probably measures about 3 x 3 mm.  It is nontender.  Neurological:     Mental Status: He is alert and oriented to person, place, and time.  Psychiatric:        Behavior: Behavior normal.        Thought Content: Thought content normal.        Judgment: Judgment normal.     Lab Results  Component Value Date   WBC 5.2 09/30/2023   HGB 14.0 09/30/2023   HCT 41.4 09/30/2023   MCV 90.6 09/30/2023   PLT 115 (L) 09/30/2023   Lab Results  Component Value Date   FERRITIN 20 (L) 05/22/2021   IRON 71 05/22/2021   TIBC 384 05/22/2021   UIBC 313 05/22/2021   IRONPCTSAT 18 (L) 05/22/2021   Lab Results  Component Value Date   RETICCTPCT 1.6 05/22/2021   RBC 4.57 09/30/2023   No results found for: "KPAFRELGTCHN", "LAMBDASER", "KAPLAMBRATIO" No results found for: "IGGSERUM", "IGA", "IGMSERUM" No results found for: "TOTALPROTELP", "ALBUMINELP", "A1GS", "A2GS", "BETS", "BETA2SER", "GAMS", "MSPIKE", "SPEI"   Chemistry      Component Value Date/Time   NA 140 09/30/2023 0939   NA 147 (H) 08/06/2017 0816   NA 144 02/04/2017 0758   K 5.2 (H) 09/30/2023 0939   K 4.6 08/06/2017 0816   K 4.6 02/04/2017 0758   CL 105 09/30/2023 0939   CL 104 08/06/2017 0816   CO2 30 09/30/2023 0939   CO2 30 08/06/2017 0816   CO2 25 02/04/2017 0758   BUN 17 09/30/2023 0939   BUN 12 08/06/2017 0816   BUN 14.2 02/04/2017 0758   CREATININE 1.49 (H) 09/30/2023 0939   CREATININE 0.9 08/06/2017 0816   CREATININE 0.9 02/04/2017 0758      Component Value Date/Time   CALCIUM 9.0 09/30/2023 0939   CALCIUM 9.0 08/06/2017 0816   CALCIUM 8.9 02/04/2017 0758   ALKPHOS 97 09/30/2023 0939   ALKPHOS 108 (H) 08/06/2017 0816    ALKPHOS 113 02/04/2017 0758   AST 20 09/30/2023 0939   AST 24 02/04/2017 0758   ALT 13 09/30/2023 0939   ALT 27 08/06/2017 0816   ALT 20 02/04/2017 0758   BILITOT 1.0 09/30/2023 0939   BILITOT 0.91 02/04/2017 0758  Impression and Plan: Keith [name] is 53 yo [place] gentleman returns for a follow up for history of multiple maglinancies in the setting of Lynch syndrome.  I do not see any problems with recurrence of his small bowel adenocarcinoma nor the left ureteral cancer.  He also has had stomach cancer.  I think that we still have to follow him along with scans.  We will going to the scanner for him probably sometime in June.  I think this would be reasonable.  I am so happy for him.  So far, everything is going quite nicely.  His blood pressure is under better control.  I will see him back after he has his CT scan.   Josph Macho, MD 1/23/202510:53 AM

## 2024-02-07 ENCOUNTER — Ambulatory Visit: Payer: Self-pay | Admitting: Hematology & Oncology

## 2024-02-07 ENCOUNTER — Encounter: Payer: Self-pay | Admitting: *Deleted

## 2024-02-07 ENCOUNTER — Ambulatory Visit (HOSPITAL_BASED_OUTPATIENT_CLINIC_OR_DEPARTMENT_OTHER)
Admission: RE | Admit: 2024-02-07 | Discharge: 2024-02-07 | Disposition: A | Discharge status: Home / Self Care | Payer: Managed Care, Other (non HMO) | Source: Ambulatory Visit | Attending: Hematology & Oncology | Admitting: Hematology & Oncology

## 2024-02-07 ENCOUNTER — Encounter (HOSPITAL_BASED_OUTPATIENT_CLINIC_OR_DEPARTMENT_OTHER): Payer: Self-pay

## 2024-02-07 DIAGNOSIS — C178 Malignant neoplasm of overlapping sites of small intestine: Secondary | ICD-10-CM | POA: Insufficient documentation

## 2024-02-07 DIAGNOSIS — C161 Malignant neoplasm of fundus of stomach: Secondary | ICD-10-CM | POA: Insufficient documentation

## 2024-02-07 MED ORDER — IOHEXOL 300 MG/ML  SOLN
100.0000 mL | Freq: Once | INTRAMUSCULAR | Status: AC | PRN
  Administered 2024-02-07: 100 mL via INTRAVENOUS

## 2024-02-17 ENCOUNTER — Inpatient Hospital Stay (HOSPITAL_BASED_OUTPATIENT_CLINIC_OR_DEPARTMENT_OTHER): Payer: Managed Care, Other (non HMO) | Admitting: Hematology & Oncology

## 2024-02-17 ENCOUNTER — Encounter: Payer: Self-pay | Admitting: Hematology & Oncology

## 2024-02-17 ENCOUNTER — Inpatient Hospital Stay: Payer: Managed Care, Other (non HMO) | Attending: Hematology & Oncology

## 2024-02-17 VITALS — BP 161/110 | HR 62 | Temp 97.9°F | Resp 20 | Ht 66.0 in | Wt 129.7 lb

## 2024-02-17 DIAGNOSIS — Z7901 Long term (current) use of anticoagulants: Secondary | ICD-10-CM | POA: Insufficient documentation

## 2024-02-17 DIAGNOSIS — Z8554 Personal history of malignant neoplasm of ureter: Secondary | ICD-10-CM | POA: Diagnosis present

## 2024-02-17 DIAGNOSIS — C178 Malignant neoplasm of overlapping sites of small intestine: Secondary | ICD-10-CM

## 2024-02-17 DIAGNOSIS — C689 Malignant neoplasm of urinary organ, unspecified: Secondary | ICD-10-CM

## 2024-02-17 DIAGNOSIS — Z85028 Personal history of other malignant neoplasm of stomach: Secondary | ICD-10-CM | POA: Diagnosis present

## 2024-02-17 DIAGNOSIS — Z85038 Personal history of other malignant neoplasm of large intestine: Secondary | ICD-10-CM | POA: Diagnosis present

## 2024-02-17 DIAGNOSIS — C162 Malignant neoplasm of body of stomach: Secondary | ICD-10-CM | POA: Diagnosis not present

## 2024-02-17 DIAGNOSIS — Z1509 Genetic susceptibility to other malignant neoplasm: Secondary | ICD-10-CM | POA: Diagnosis not present

## 2024-02-17 DIAGNOSIS — C161 Malignant neoplasm of fundus of stomach: Secondary | ICD-10-CM

## 2024-02-17 LAB — CMP (CANCER CENTER ONLY)
ALT: 15 U/L (ref 0–44)
AST: 21 U/L (ref 15–41)
Albumin: 4.4 g/dL (ref 3.5–5.0)
Alkaline Phosphatase: 107 U/L (ref 38–126)
Anion gap: 7 (ref 5–15)
BUN: 12 mg/dL (ref 6–20)
CO2: 26 mmol/L (ref 22–32)
Calcium: 9 mg/dL (ref 8.9–10.3)
Chloride: 107 mmol/L (ref 98–111)
Creatinine: 1.68 mg/dL — ABNORMAL HIGH (ref 0.61–1.24)
GFR, Estimated: 48 mL/min — ABNORMAL LOW (ref >60)
Glucose, Bld: 96 mg/dL (ref 70–99)
Potassium: 4.2 mmol/L (ref 3.5–5.1)
Sodium: 140 mmol/L (ref 135–145)
Total Bilirubin: 1.2 mg/dL (ref 0.0–1.2)
Total Protein: 6.8 g/dL (ref 6.5–8.1)

## 2024-02-17 LAB — CBC WITH DIFFERENTIAL (CANCER CENTER ONLY)
Abs Immature Granulocytes: 0.01 10*3/uL (ref 0.00–0.07)
Basophils Absolute: 0 10*3/uL (ref 0.0–0.1)
Basophils Relative: 1 %
Eosinophils Absolute: 0.4 10*3/uL (ref 0.0–0.5)
Eosinophils Relative: 9 %
HCT: 40.7 % (ref 39.0–52.0)
Hemoglobin: 13.8 g/dL (ref 13.0–17.0)
Immature Granulocytes: 0 %
Lymphocytes Relative: 30 %
Lymphs Abs: 1.4 10*3/uL (ref 0.7–4.0)
MCH: 30.1 pg (ref 26.0–34.0)
MCHC: 33.9 g/dL (ref 30.0–36.0)
MCV: 88.9 fL (ref 80.0–100.0)
Monocytes Absolute: 0.3 10*3/uL (ref 0.1–1.0)
Monocytes Relative: 6 %
Neutro Abs: 2.6 10*3/uL (ref 1.7–7.7)
Neutrophils Relative %: 54 %
Platelet Count: 109 10*3/uL — ABNORMAL LOW (ref 150–400)
RBC: 4.58 MIL/uL (ref 4.22–5.81)
RDW: 13.1 % (ref 11.5–15.5)
WBC Count: 4.8 10*3/uL (ref 4.0–10.5)
nRBC: 0 % (ref 0.0–0.2)

## 2024-02-17 LAB — LACTATE DEHYDROGENASE: LDH: 173 U/L (ref 98–192)

## 2024-02-17 MED ORDER — TELMISARTAN 40 MG PO TABS: 40.0000 mg | ORAL_TABLET | Freq: Every day | ORAL | 3 refills | Status: DC

## 2024-02-17 NOTE — Progress Notes (Signed)
 BP remains elevated, 161/110. Instructed to monitor at home, make PCP appointment. States does not want to take medicine. Educated on dangers of HTN.  Verbalized understanding.

## 2024-02-17 NOTE — Progress Notes (Signed)
 Hematology and Oncology Follow Up Visit  Keith [name] [id number] 1970-12-22 53 y.o. 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  We last saw him back in January.  Since then, he has been doing okay.  Unfortunately, he is not taking medication for blood pressure.  He says he does not like taking medication.  I told him that he really, really needs to take medication for blood pressure.  If not, he will have a stroke.  I told him that I would be really hard thing to take for him to be cancer and then have a stroke.  I will call in some Micardis (40 mg p.o. daily) for his blood pressure.  He says that he will get with his family doctor.  We did do a CT scan.  This was done on 02/07/2024.  This did not show any evidence of recurrent cancer.  He and his family had a wonderful trip over Christmas.  That she went all the way down to [place].  They had a wonderful time down there.  They her down for about 3 to 4 days.  Enjoyed himself.  He did have a CT scan last week.  The CT scan did not show any evidence of recurrent malignancy.  Of note, his last CEA level was 5.65.  He has had no abdominal pain.  He has had no change in bowel or bladder habits.  He is still working.  He still running a couple times a week.  He and his family will be going to [place] for vacation.  I am sure they will enjoy this.  Overall, I would  say that his performance status is probably ECOG 1.   Medications:  Allergies as of 02/17/2024   No Known Allergies      Medication List        Accurate as of February 17, 2024 11:14 AM. If you have any questions, ask your nurse or doctor.          acetaminophen  325 MG tablet Commonly known as: TYLENOL  Take 650 mg by mouth every 6 (six) hours as needed for headache.   carboxymethylcellulose 0.5 % Soln Commonly known as: REFRESH PLUS Place 1 drop into both eyes 3 (three) times daily as needed (dry/irritated eyes.).   co-enzyme Q-10 30 MG capsule Take 30 mg by mouth as needed. 08/07/2020 Takes 2 times/weekly.   lipase/protease/amylase 40981 UNITS Cpep capsule Commonly known as: Creon  Take 1 capsule (36,000 Units total) by mouth 3 (three) times daily with meals. What changed: additional instructions   telmisartan 40 MG tablet Commonly known as: Micardis Take 1 tablet (40 mg total) by mouth daily. Started by: Keith [name]   VITAMIN B-12 PO Take by mouth daily.   VITAMIN D  (ERGOCALCIFEROL ) PO Take by mouth daily. 01/19/2022 Patient does not know dose.        Allergies:  No Known Allergies  Past Medical History, Surgical history, Social history, and Family History were reviewed and updated.  Review of Systems: Review of Systems  Constitutional:  Positive for malaise/fatigue.  HENT: Negative.    Eyes: Negative.   Respiratory: Negative.    Cardiovascular: Negative.   Gastrointestinal: Negative.   Genitourinary: Negative.   Musculoskeletal: Negative.   Skin:  Positive for rash.  Neurological: Negative.   Endo/Heme/Allergies: Negative.   Psychiatric/Behavioral: Negative.       Physical Exam:  height is 5' 6 (1.676 m) and weight is 129 lb 11.2 oz (58.8 kg). His oral temperature is 97.9 F (36.6 C). His blood pressure is 161/110 (abnormal) and his pulse is 62. His respiration is 20 and oxygen saturation is 100%.   Wt Readings from Last 3 Encounters:   02/17/24 129 lb 11.2 oz (58.8 kg)  09/30/23 128 lb (58.1 kg)  06/21/23 125 lb 4 oz (56.8 kg)    Physical Exam Vitals reviewed.  HENT:     Head: Normocephalic and atraumatic.   Eyes:     Pupils: Pupils are equal, round, and reactive to light.    Cardiovascular:     Rate and Rhythm: Normal rate and regular rhythm.     Heart sounds: Normal heart sounds.  Pulmonary:     Effort: Pulmonary effort is normal.     Breath sounds: Normal breath sounds.  Abdominal:     General: Bowel sounds are normal.     Palpations: Abdomen is soft.     Comments: Abdominal exam shows a well-healed laparotomy scar.  He has no masses.  There is no fluid wave.  There is no palpable hepatosplenomegaly.   Musculoskeletal:        General: No tenderness or deformity. Normal range of motion.     Cervical back: Normal range of motion.  Lymphadenopathy:     Cervical: No cervical adenopathy.   Skin:    General: Skin is warm and dry.     Findings: No erythema or rash.     Comments: In the right suprapubic area, there is a nodular dark lesion.  It probably measures about 3 x 3 mm.  It is nontender.   Neurological:     Mental Status: He is alert and oriented to person, place, and time.   Psychiatric:        Behavior: Behavior normal.        Thought Content: Thought content normal.        Judgment: Judgment normal.      Lab Results  Component Value Date   WBC 4.8 02/17/2024   HGB 13.8 02/17/2024   HCT 40.7 02/17/2024   MCV 88.9 02/17/2024   PLT 109 (L) 02/17/2024   Lab Results  Component Value Date   FERRITIN 20 (L) 05/22/2021   IRON 71 05/22/2021   TIBC 384 05/22/2021   UIBC 313 05/22/2021   IRONPCTSAT 18 (L) 05/22/2021   Lab Results  Component Value Date   RETICCTPCT 1.6 05/22/2021   RBC 4.58 02/17/2024   No results found for: KPAFRELGTCHN, LAMBDASER, KAPLAMBRATIO No results found for: IGGSERUM, IGA, IGMSERUM No results found for: Tanner Fanny, A1GS,  A2GS, BETS, BETA2SER, GAMS, MSPIKE, SPEI   Chemistry      Component Value Date/Time   NA 140 02/17/2024 0930   NA 147 (H) 08/06/2017 0816   NA 144 02/04/2017 0758   K 4.2 02/17/2024 0930   K 4.6 08/06/2017 0816   K 4.6 02/04/2017 0758  CL 107 02/17/2024 0930   CL 104 08/06/2017 0816   CO2 26 02/17/2024 0930   CO2 30 08/06/2017 0816   CO2 25 02/04/2017 0758   BUN 12 02/17/2024 0930   BUN 12 08/06/2017 0816   BUN 14.2 02/04/2017 0758   CREATININE 1.68 (H) 02/17/2024 0930   CREATININE 0.9 08/06/2017 0816   CREATININE 0.9 02/04/2017 0758      Component Value Date/Time   CALCIUM 9.0 02/17/2024 0930   CALCIUM 9.0 08/06/2017 0816   CALCIUM 8.9 02/04/2017 0758   ALKPHOS 107 02/17/2024 0930   ALKPHOS 108 (H) 08/06/2017 0816   ALKPHOS 113 02/04/2017 0758   AST 21 02/17/2024 0930   AST 24 02/04/2017 0758   ALT 15 02/17/2024 0930   ALT 27 08/06/2017 0816   ALT 20 02/04/2017 0758   BILITOT 1.2 02/17/2024 0930   BILITOT 0.91 02/04/2017 0758       Impression and Plan: Mr. Buchanon is 54 yo [place] gentleman returns for a follow up for history of multiple maglinancies in the setting of Lynch syndrome.  I do not see any problems with recurrence of his small bowel adenocarcinoma nor the left ureteral cancer.  He also has had stomach cancer.  I am glad that the CT scan looks good.  Again I think his bigger problem is the blood pressure.  Hopefully, he will take the Micardis.  We will make sure that his family doctor knows that we gave him the Micardis.  I do not think we need another scan on him for a while..  I will plan to see him back in about 3 months.  I would like to follow him up little bit more closely this time just because of the blood pressure issues.   Keith Mars, MD 6/12/202511:14 AM

## 2024-02-18 ENCOUNTER — Telehealth: Payer: Self-pay

## 2024-02-18 NOTE — Telephone Encounter (Signed)
-----   Message from Betty Swaziland sent at 02/18/2024  7:46 AM EDT ----- Can you please arrange f/u appt for HTN in about 6 weeks. Thanks, BJ ----- Message ----- From: Ivor Mars, MD Sent: 02/17/2024  11:19 AM EDT To: Betty G Sw[place], MD  Dr. [place]: I just wanted let you know that I put Mr. Sagraves on Micardis  for his blood pressure.  His blood pressure was really high.  We checked it twice.    Hopefully, he will take this.  I think he has an appoint with you I think he said in September.  I do not know if you would like to see him any sooner just to monitor his blood pressure.   Thank you so much for helping out.  I very much appreciate all that you do for him.   Twilla Galea

## 2024-02-18 NOTE — Telephone Encounter (Signed)
 I left pt a voicemail to call the office back to make an appointment, needs a 6 week f/u appt with Dr. [place].

## 2024-05-19 ENCOUNTER — Inpatient Hospital Stay (HOSPITAL_BASED_OUTPATIENT_CLINIC_OR_DEPARTMENT_OTHER): Admitting: Hematology & Oncology

## 2024-05-19 ENCOUNTER — Inpatient Hospital Stay: Attending: Hematology & Oncology

## 2024-05-19 VITALS — BP 119/87 | HR 55 | Temp 97.8°F | Resp 17 | Ht 66.0 in | Wt 126.0 lb

## 2024-05-19 DIAGNOSIS — C178 Malignant neoplasm of overlapping sites of small intestine: Secondary | ICD-10-CM | POA: Diagnosis not present

## 2024-05-19 DIAGNOSIS — Z7901 Long term (current) use of anticoagulants: Secondary | ICD-10-CM | POA: Insufficient documentation

## 2024-05-19 DIAGNOSIS — C689 Malignant neoplasm of urinary organ, unspecified: Secondary | ICD-10-CM

## 2024-05-19 DIAGNOSIS — Z8554 Personal history of malignant neoplasm of ureter: Secondary | ICD-10-CM | POA: Insufficient documentation

## 2024-05-19 DIAGNOSIS — Z85028 Personal history of other malignant neoplasm of stomach: Secondary | ICD-10-CM | POA: Diagnosis present

## 2024-05-19 DIAGNOSIS — Z9221 Personal history of antineoplastic chemotherapy: Secondary | ICD-10-CM | POA: Diagnosis not present

## 2024-05-19 DIAGNOSIS — Z1509 Genetic susceptibility to other malignant neoplasm: Secondary | ICD-10-CM | POA: Insufficient documentation

## 2024-05-19 DIAGNOSIS — Z85038 Personal history of other malignant neoplasm of large intestine: Secondary | ICD-10-CM | POA: Insufficient documentation

## 2024-05-19 DIAGNOSIS — C162 Malignant neoplasm of body of stomach: Secondary | ICD-10-CM | POA: Diagnosis not present

## 2024-05-19 LAB — CBC WITH DIFFERENTIAL (CANCER CENTER ONLY)
Abs Immature Granulocytes: 0.01 K/uL (ref 0.00–0.07)
Basophils Absolute: 0 K/uL (ref 0.0–0.1)
Basophils Relative: 1 %
Eosinophils Absolute: 0.4 K/uL (ref 0.0–0.5)
Eosinophils Relative: 9 %
HCT: 38.3 % — ABNORMAL LOW (ref 39.0–52.0)
Hemoglobin: 12.9 g/dL — ABNORMAL LOW (ref 13.0–17.0)
Immature Granulocytes: 0 %
Lymphocytes Relative: 32 %
Lymphs Abs: 1.5 K/uL (ref 0.7–4.0)
MCH: 30.4 pg (ref 26.0–34.0)
MCHC: 33.7 g/dL (ref 30.0–36.0)
MCV: 90.1 fL (ref 80.0–100.0)
Monocytes Absolute: 0.3 K/uL (ref 0.1–1.0)
Monocytes Relative: 7 %
Neutro Abs: 2.4 K/uL (ref 1.7–7.7)
Neutrophils Relative %: 51 %
Platelet Count: 119 K/uL — ABNORMAL LOW (ref 150–400)
RBC: 4.25 MIL/uL (ref 4.22–5.81)
RDW: 13.8 % (ref 11.5–15.5)
WBC Count: 4.7 K/uL (ref 4.0–10.5)
nRBC: 0 % (ref 0.0–0.2)

## 2024-05-19 LAB — CMP (CANCER CENTER ONLY)
ALT: 23 U/L (ref 0–44)
AST: 30 U/L (ref 15–41)
Albumin: 4.3 g/dL (ref 3.5–5.0)
Alkaline Phosphatase: 128 U/L — ABNORMAL HIGH (ref 38–126)
Anion gap: 9 (ref 5–15)
BUN: 10 mg/dL (ref 6–20)
CO2: 24 mmol/L (ref 22–32)
Calcium: 8.9 mg/dL (ref 8.9–10.3)
Chloride: 110 mmol/L (ref 98–111)
Creatinine: 1.67 mg/dL — ABNORMAL HIGH (ref 0.61–1.24)
GFR, Estimated: 48 mL/min — ABNORMAL LOW (ref >60)
Glucose, Bld: 92 mg/dL (ref 70–99)
Potassium: 4.9 mmol/L (ref 3.5–5.1)
Sodium: 142 mmol/L (ref 135–145)
Total Bilirubin: 0.7 mg/dL (ref 0.0–1.2)
Total Protein: 6.6 g/dL (ref 6.5–8.1)

## 2024-05-19 NOTE — Progress Notes (Signed)
 Hematology and Oncology Follow Up Visit  Keith [name] [id number] 1971-01-10 53 y.o. 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  We last saw him back in June.  Since then, he and his family have had a good Summer.  They went to [place].  They went to [facility] and then went up to [place].  They really enjoyed it up in [place].  He is not taking Micardis .  His blood pressure is a lot better.  So far, there is been no evidence of cancer recurrence.  He has finished chemotherapy now for 4 years.  His last CEA back in September of last year was 5.65.  He is exercising.  He is eating well.  He has had no change in bowel or bladder habits.  He has had no cough or shortness of breath.  He has had no rashes.  Has had no bleeding.  Has been no leg swelling.  Overall, I would have to say that his performance status about ECOG 0.   Medications:  Allergies as of 05/19/2024   No Known Allergies      Medication List        Accurate as of May 19, 2024 10:00 AM. If you have any questions, ask your nurse or doctor.          acetaminophen  325 MG tablet Commonly known as: TYLENOL  Take 650 mg by mouth every 6 (six) hours as needed for headache.   carboxymethylcellulose 0.5 % Soln Commonly known as: REFRESH PLUS Place 1 drop into both eyes 3 (three) times daily as needed (dry/irritated eyes.).    co-enzyme Q-10 30 MG capsule Take 30 mg by mouth as needed. 08/07/2020 Takes 2 times/weekly.   lipase/protease/amylase 63999 UNITS Cpep capsule Commonly known as: Creon  Take 1 capsule (36,000 Units total) by mouth 3 (three) times daily with meals. What changed: additional instructions   telmisartan  40 MG tablet Commonly known as: Micardis  Take 1 tablet (40 mg total) by mouth daily.   VITAMIN B-12 PO Take by mouth daily.   VITAMIN D  (ERGOCALCIFEROL ) PO Take by mouth daily. 01/19/2022 Patient does not know dose.        Allergies: No Known Allergies  Past Medical History, Surgical history, Social history, and Family History were reviewed and updated.  Review of Systems: Review of Systems  Constitutional:  Positive for malaise/fatigue.  HENT: Negative.    Eyes: Negative.   Respiratory: Negative.    Cardiovascular: Negative.   Gastrointestinal: Negative.   Genitourinary: Negative.   Musculoskeletal: Negative.   Skin:  Positive for rash.  Neurological: Negative.   Endo/Heme/Allergies: Negative.   Psychiatric/Behavioral: Negative.       Physical Exam:  height is 5' 6 (1.676 m) and weight is 126 lb (57.2 kg). His oral temperature is 97.8 F (36.6 C). His blood pressure is 119/87 and his pulse is 55 (abnormal). His respiration  is 17 and oxygen saturation is 100%.   Wt Readings from Last 3 Encounters:  05/19/24 126 lb (57.2 kg)  02/17/24 129 lb 11.2 oz (58.8 kg)  09/30/23 128 lb (58.1 kg)    Physical Exam Vitals reviewed.  HENT:     Head: Normocephalic and atraumatic.  Eyes:     Pupils: Pupils are equal, round, and reactive to light.  Cardiovascular:     Rate and Rhythm: Normal rate and regular rhythm.     Heart sounds: Normal heart sounds.  Pulmonary:     Effort: Pulmonary effort is normal.     Breath sounds: Normal breath sounds.  Abdominal:     General: Bowel sounds are normal.     Palpations: Abdomen is soft.     Comments: Abdominal exam shows a  well-healed laparotomy scar.  He has no masses.  There is no fluid wave.  There is no palpable hepatosplenomegaly.  Musculoskeletal:        General: No tenderness or deformity. Normal range of motion.     Cervical back: Normal range of motion.  Lymphadenopathy:     Cervical: No cervical adenopathy.  Skin:    General: Skin is warm and dry.     Findings: No erythema or rash.     Comments: In the right suprapubic area, there is a nodular dark lesion.  It probably measures about 3 x 3 mm.  It is nontender.  Neurological:     Mental Status: He is alert and oriented to person, place, and time.  Psychiatric:        Behavior: Behavior normal.        Thought Content: Thought content normal.        Judgment: Judgment normal.      Lab Results  Component Value Date   WBC 4.7 05/19/2024   HGB 12.9 (L) 05/19/2024   HCT 38.3 (L) 05/19/2024   MCV 90.1 05/19/2024   PLT 119 (L) 05/19/2024   Lab Results  Component Value Date   FERRITIN 20 (L) 05/22/2021   IRON 71 05/22/2021   TIBC 384 05/22/2021   UIBC 313 05/22/2021   IRONPCTSAT 18 (L) 05/22/2021   Lab Results  Component Value Date   RETICCTPCT 1.6 05/22/2021   RBC 4.25 05/19/2024   No results found for: KPAFRELGTCHN, LAMBDASER, KAPLAMBRATIO No results found for: IGGSERUM, IGA, IGMSERUM No results found for: STEPHANY CARLOTA BENSON MARKEL EARLA JOANNIE DOC, MSPIKE, SPEI   Chemistry      Component Value Date/Time   NA 140 02/17/2024 0930   NA 147 (H) 08/06/2017 0816   NA 144 02/04/2017 0758   K 4.2 02/17/2024 0930   K 4.6 08/06/2017 0816   K 4.6 02/04/2017 0758   CL 107 02/17/2024 0930   CL 104 08/06/2017 0816   CO2 26 02/17/2024 0930   CO2 30 08/06/2017 0816   CO2 25 02/04/2017 0758   BUN 12 02/17/2024 0930   BUN 12 08/06/2017 0816   BUN 14.2 02/04/2017 0758   CREATININE 1.68 (H) 02/17/2024 0930   CREATININE 0.9 08/06/2017 0816   CREATININE 0.9 02/04/2017 0758      Component Value  Date/Time   CALCIUM 9.0 02/17/2024 0930   CALCIUM 9.0 08/06/2017 0816   CALCIUM 8.9 02/04/2017 0758   ALKPHOS 107 02/17/2024 0930   ALKPHOS 108 (H) 08/06/2017 0816   ALKPHOS 113 02/04/2017 0758   AST 21 02/17/2024 0930   AST 24 02/04/2017 0758   ALT 15 02/17/2024 0930  ALT 27 08/06/2017 0816   ALT 20 02/04/2017 0758   BILITOT 1.2 02/17/2024 0930   BILITOT 0.91 02/04/2017 0758       Impression and Plan: Mr. Burnley is 53 yo [place] gentleman returns for a follow up for history of multiple maglinancies in the setting of Lynch syndrome.  I do not see any problems with recurrence of his small bowel adenocarcinoma nor the left ureteral cancer.  He also has had stomach cancer.    I think that we still have to follow him along with scans.  Will set up a scan for him in December.  I will then plan to see him back after that.     Maude JONELLE Crease, MD 9/12/202510:00 AM

## 2024-05-20 LAB — CANCER ANTIGEN 19-9: CA 19-9: 13 U/mL (ref 0–35)

## 2024-05-29 ENCOUNTER — Other Ambulatory Visit: Payer: Self-pay | Admitting: *Deleted

## 2024-05-29 MED ORDER — TELMISARTAN 40 MG PO TABS: 40.0000 mg | ORAL_TABLET | Freq: Every day | ORAL | 3 refills | Status: DC

## 2024-06-27 ENCOUNTER — Other Ambulatory Visit: Payer: Self-pay | Admitting: Hematology & Oncology

## 2024-07-04 ENCOUNTER — Encounter: Payer: Self-pay | Admitting: Family Medicine

## 2024-07-04 ENCOUNTER — Ambulatory Visit: Admitting: Family Medicine

## 2024-07-04 VITALS — BP 120/80 | HR 51 | Temp 97.8°F | Resp 16 | Ht 66.0 in | Wt 127.4 lb

## 2024-07-04 DIAGNOSIS — Z130 Encounter for screening for diseases of the blood and blood-forming organs and certain disorders involving the immune mechanism: Secondary | ICD-10-CM

## 2024-07-04 DIAGNOSIS — Z0000 Encounter for general adult medical examination without abnormal findings: Secondary | ICD-10-CM | POA: Diagnosis not present

## 2024-07-04 DIAGNOSIS — Z13220 Encounter for screening for lipoid disorders: Secondary | ICD-10-CM | POA: Diagnosis not present

## 2024-07-04 DIAGNOSIS — Z13228 Encounter for screening for other metabolic disorders: Secondary | ICD-10-CM | POA: Diagnosis not present

## 2024-07-04 DIAGNOSIS — Z23 Encounter for immunization: Secondary | ICD-10-CM | POA: Diagnosis not present

## 2024-07-04 DIAGNOSIS — I10 Essential (primary) hypertension: Secondary | ICD-10-CM

## 2024-07-04 DIAGNOSIS — N1831 Chronic kidney disease, stage 3a: Secondary | ICD-10-CM | POA: Insufficient documentation

## 2024-07-04 DIAGNOSIS — Z1329 Encounter for screening for other suspected endocrine disorder: Secondary | ICD-10-CM

## 2024-07-04 LAB — MICROALBUMIN / CREATININE URINE RATIO
Creatinine,U: 30.3 mg/dL
Microalb Creat Ratio: UNDETERMINED mg/g (ref 0.0–30.0)
Microalb, Ur: <0.7 mg/dL

## 2024-07-04 LAB — POCT GLYCOSYLATED HEMOGLOBIN (HGB A1C): Hemoglobin A1C: 5.8 % — AB (ref 4.0–5.6)

## 2024-07-04 MED ORDER — TELMISARTAN 40 MG PO TABS: 40.0000 mg | ORAL_TABLET | Freq: Every day | ORAL | 3 refills | Status: AC

## 2024-07-04 NOTE — Patient Instructions (Addendum)
 A few things to remember from today's visit:  Routine general medical examination at a health care facility  Essential hypertension, benign - Plan: telmisartan  (MICARDIS ) 40 MG tablet  Screening for lipoid disorders  Screening for endocrine, metabolic and immunity disorder  No changes today. Please ask to have extra labs with next blood work. Monitor blood pressure at home.  If you need refills for medications you take chronically, please call your pharmacy. Do not use My Chart to request refills or for acute issues that need immediate attention. If you send a my chart message, it may take a few days to be addressed, specially if I am not in the office.  Please be sure medication list is accurate. If a new problem present, please set up appointment sooner than planned today.  Health Maintenance, Male Adopting a healthy lifestyle and getting preventive care are important in promoting health and wellness. Ask your health care provider about: The right schedule for you to have regular tests and exams. Things you can do on your own to prevent diseases and keep yourself healthy. What should I know about diet, weight, and exercise? Eat a healthy diet  Eat a diet that includes plenty of vegetables, fruits, low-fat dairy products, and lean protein. Do not eat a lot of foods that are high in solid fats, added sugars, or sodium. Maintain a healthy weight Body mass index (BMI) is a measurement that can be used to identify possible weight problems. It estimates body fat based on height and weight. Your health care provider can help determine your BMI and help you achieve or maintain a healthy weight. Get regular exercise Get regular exercise. This is one of the most important things you can do for your health. Most adults should: Exercise for at least 150 minutes each week. The exercise should increase your heart rate and make you sweat (moderate-intensity exercise). Do strengthening exercises  at least twice a week. This is in addition to the moderate-intensity exercise. Spend less time sitting. Even light physical activity can be beneficial. Watch cholesterol and blood lipids Have your blood tested for lipids and cholesterol at 55 years of age, then have this test every 5 years. You may need to have your cholesterol levels checked more often if: Your lipid or cholesterol levels are high. You are older than 55 years of age. You are at high risk for heart disease. What should I know about cancer screening? Many types of cancers can be detected early and may often be prevented. Depending on your health history and family history, you may need to have cancer screening at various ages. This may include screening for: Colorectal cancer. Prostate cancer. Skin cancer. Lung cancer. What should I know about heart disease, diabetes, and high blood pressure? Blood pressure and heart disease High blood pressure causes heart disease and increases the risk of stroke. This is more likely to develop in people who have high blood pressure readings or are overweight. Talk with your health care provider about your target blood pressure readings. Have your blood pressure checked: Every 3-5 years if you are 73-55 years of age. Every year if you are 89 years old or older. If you are between the ages of 53 and 6 and are a current or former smoker, ask your health care provider if you should have a one-time screening for abdominal aortic aneurysm (AAA). Diabetes Have regular diabetes screenings. This checks your fasting blood sugar level. Have the screening done: Once every three years after  age 79 if you are at a normal weight and have a low risk for diabetes. More often and at a younger age if you are overweight or have a high risk for diabetes. What should I know about preventing infection? Hepatitis B If you have a higher risk for hepatitis B, you should be screened for this virus. Talk with your  health care provider to find out if you are at risk for hepatitis B infection. Hepatitis C Blood testing is recommended for: Everyone born from [birth year] through 1967. Anyone with known risk factors for hepatitis C. Sexually transmitted infections (STIs) You should be screened each year for STIs, including gonorrhea and chlamydia, if: You are sexually active and are younger than 55 years of age. You are older than 55 years of age and your health care provider tells you that you are at risk for this type of infection. Your sexual activity has changed since you were last screened, and you are at increased risk for chlamydia or gonorrhea. Ask your health care provider if you are at risk. Ask your health care provider about whether you are at high risk for HIV. Your health care provider may recommend a prescription medicine to help prevent HIV infection. If you choose to take medicine to prevent HIV, you should first get tested for HIV. You should then be tested every 3 months for as long as you are taking the medicine. Follow these instructions at home: Alcohol use Do not drink alcohol if your health care provider tells you not to drink. If you drink alcohol: Limit how much you have to 0-2 drinks a day. Know how much alcohol is in your drink. In the [place] one drink equals one 12 oz bottle of beer (355 mL), one 5 oz glass of wine (148 mL), or one 1 oz glass of hard liquor (44 mL). Lifestyle Do not use any products that contain nicotine or tobacco. These products include cigarettes, chewing tobacco, and vaping devices, such as e-cigarettes. If you need help quitting, ask your health care provider. Do not use street drugs. Do not share needles. Ask your health care provider for help if you need support or information about quitting drugs. General instructions Schedule regular health, dental, and eye exams. Stay current with your vaccines. Tell your health care provider if: You often feel  depressed. You have ever been abused or do not feel safe at home. Summary Adopting a healthy lifestyle and getting preventive care are important in promoting health and wellness. Follow your health care provider's instructions about healthy diet, exercising, and getting tested or screened for diseases. Follow your health care provider's instructions on monitoring your cholesterol and blood pressure. This information is not intended to replace advice given to you by your health care provider. Make sure you discuss any questions you have with your health care provider. Document Revised: 01/13/2021 Document Reviewed: 01/13/2021 Elsevier Patient Education  2024 [employer].

## 2024-07-04 NOTE — Assessment & Plan Note (Signed)
 We discussed the importance of regular physical activity and healthy diet for prevention of chronic illness and/or complications. Preventive guidelines reviewed. Vaccination: Prevnar 20 and Tdap given today. Prefers to hold on shingrix and does not want flu vaccine. Next CPE in a year.

## 2024-07-04 NOTE — Assessment & Plan Note (Signed)
 Cr 1.4-1.6 and e GFT 44-52. Continue adequate hydration, low salt diet, good BP controlled,and avoidance of NSAID's. On Telmisartan .

## 2024-07-04 NOTE — Progress Notes (Signed)
 Chief Complaint  Patient presents with   Annual Exam    Not fasting, high BP   Discussed the use of AI scribe software for clinical note transcription with the patient, who gave verbal consent to proceed. History of Present Illness Keith [name] is a 53 year old male with a PMHx significant for HTN, MSH2-related lynch synd, gastric cancer, urothelial cancer, GERD, and IFG here today for his routine physical examination.   Since his last visit , he has been started on pharmacologic treatment for HTN. BP at some point was elevated at 160/100 mmHg approximately four months ago. He started telmisartan  40 mg daily at that time. No chest pain, difficulty breathing, or palpitations.  CKD III: Cr has been 1.4-1.6 and e GFR 44-52. CMP and CBC done regularly at his oncologist's office.  He sees his oncologist every three to four months for follow-up and blood work.   Today he reports occasional left-sided back pain that is not continuous and has been intermittent soreness for months, but it does not significantly bother him. Does not take medication for pain.  He exercises one to two times per week, more frequently in the summer, and less in the winter due to cold weather.  He cooks at home, eats vegetables daily, and sleeps about seven hours per night He does not consume alcohol or smoke. He quit smoking in 09/2013. He sees his eye care provider and dentist regularly.  He is scheduled to see his urologist next month for routine follow-up.   Immunization History  Administered Date(s) Administered   Influenza,inj,Quad PF,6+ Mos 05/24/2014   Pneumococcal Polysaccharide-23 12/07/2012    Health Maintenance  Topic Date Due   Pneumococcal Vaccine: 50+ Years (2 of 2 - PCV) 12/07/2013   COVID-19 Vaccine (1) 07/20/2024 (Originally [birth year]1974)   Zoster Vaccines- Shingrix (1 of 2) 10/04/2024 (Originally 07/30/1988)   Influenza Vaccine  12/05/2024 (Originally 04/07/2024)   DTaP/Tdap/Td (1 - Tdap)  07/04/2025 (Originally 07/30/1988)   Hepatitis B Vaccines 19-59 Average Risk (1 of 3 - 19+ 3-dose series) 07/04/2025 (Originally 07/30/1988)   Hepatitis C Screening  07/04/2025 (Originally 07/31/1987)   HIV Screening  06/20/2028 (Originally 07/30/1984)   Colonoscopy  01/02/2025   HPV VACCINES  Aged Out   Meningococcal B Vaccine  Aged Out    Lab Results  Component Value Date   CREATININE 1.67 (H) 05/19/2024   BUN 10 05/19/2024   NA 142 05/19/2024   K 4.9 05/19/2024   CL 110 05/19/2024   CO2 24 05/19/2024   Lab Results  Component Value Date   HGBA1C 5.9 (H) 10/24/2019   Lab Results  Component Value Date   CHOL 136 11/30/2012   HDL 41 11/30/2012   LDLCALC 74 11/30/2012   TRIG 107 11/30/2012   CHOLHDL 3.3 11/30/2012   Review of Systems  Constitutional:  Negative for activity change, appetite change and fever.  HENT:  Negative for mouth sores, sore throat and trouble swallowing.   Eyes:  Negative for redness and visual disturbance.  Respiratory:  Negative for cough, shortness of breath and wheezing.   Cardiovascular:  Negative for chest pain, palpitations and leg swelling.  Gastrointestinal:  Negative for abdominal pain, blood in stool, nausea and vomiting.  Endocrine: Negative for cold intolerance, heat intolerance, polydipsia, polyphagia and polyuria.  Genitourinary:  Negative for decreased urine volume, dysuria, genital sores, hematuria and testicular pain.  Musculoskeletal:  Positive for back pain. Negative for gait problem.  Skin:  Negative for color change and rash.  Allergic/Immunologic: Negative for environmental allergies.  Neurological:  Negative for syncope, weakness and headaches.  Psychiatric/Behavioral:  Negative for confusion and sleep disturbance. The patient is not nervous/anxious.   All other systems reviewed and are negative.  Current Outpatient Medications on File Prior to Visit  Medication Sig Dispense Refill   acetaminophen  (TYLENOL ) 325 MG tablet Take  650 mg by mouth every 6 (six) hours as needed for headache.     carboxymethylcellulose (REFRESH PLUS) 0.5 % SOLN Place 1 drop into both eyes 3 (three) times daily as needed (dry/irritated eyes.).      co-enzyme Q-10 30 MG capsule Take 30 mg by mouth as needed. 08/07/2020 Takes 2 times/weekly.     Cyanocobalamin (VITAMIN B-12 PO) Take by mouth daily.     lipase/protease/amylase (CREON ) 36000 UNITS CPEP capsule Take 1 capsule (36,000 Units total) by mouth 3 (three) times daily with meals. 90 capsule 11   VITAMIN D , ERGOCALCIFEROL , PO Take by mouth daily. 01/19/2022 Patient does not know dose.     No current facility-administered medications on file prior to visit.   Past Medical History:  Diagnosis Date   Acute thrombosis of cephalic vein, right 03/25/2020   Blood transfusion without reported diagnosis    Cancer of body of stomach (HCC) 10/25/2020   Cancer of small intestine, including duodenum (HCC) 10/26/2013   small intestine adenocarcinoma stage IIIc    CHF (congestive heart failure) (HCC)    patient denies   Chronic hepatitis B (HCC)    Diabetes mellitus without complication (HCC)    patient denies   Family history of cancer of genitourinary system    Family history of esophageal cancer    GERD (gastroesophageal reflux disease)    remote history   Hydronephrosis of left kidney    Hypertension    no medications at this time   Impaired fasting glucose    Lung nodules    2 mm nodule in the posterior left lower lobe (4/115), stable. 4 mm ground-glass nodule in the apical segment right upper lobe (4/30), unchanged as well.   Platelets decreased    Radiation 01/31/14-03/06/14   upper abdomen/surgical bed   Urothelial cancer (HCC) 12/20/2019   Past Surgical History:  Procedure Laterality Date   ABDOMINAL EXPLORATION SURGERY  09/19/2013   done in [facility]   APPENDECTOMY  1997   BACK SURGERY     tumor excision on back   COLONOSCOPY  2015   CYSTOSCOPY WITH RETROGRADE PYELOGRAM,  URETEROSCOPY AND STENT PLACEMENT Left 10/26/2019   Procedure: CYSTOSCOPY WITH LEFT RETROGRADE PYELOGRAM, URETEROSCOPY;  Surgeon: Matilda Senior, MD;  Location: [facility];  Service: Urology;  Laterality: Left;  1 HR   PANCREATICODUODENECTOMY  09/19/2013   PARTIAL GASTRECTOMY  09/19/2013   UPPER GI ENDOSCOPY     XI ROBOTICALLY ASSISTED LAPAROSCOPIC URETERAL RE-IMPLANTATION   12/06/2019   No Known Allergies  Family History  Problem Relation Age of Onset   Cancer Mother 84       [place]   Hypertension Other    Colon cancer Sister 25   Esophageal cancer Father 76   Colon cancer Cousin 72       paternal first cousin   Cancer Sister 46       [place]   Rectal cancer Neg Hx    Stomach cancer Neg Hx     Social History   Socioeconomic History   Marital status: Married    Spouse name: [name]    Number of children: 2   Years  of education: 16   Highest education level: Not on file  Occupational History   Occupation: TECHNICAL SUPPORT     Employer: [employer]    Comment: [employer]   Tobacco Use   Smoking status: Former    Current packs/day: 0.00    Average packs/day: 0.3 packs/day for 27.0 years (6.8 ttl pk-yrs)    Types: Cigarettes    Start date: 09/07/1986    Quit date: 09/13/2013    Years since quitting: 10.8   Smokeless tobacco: Never   Tobacco comments:    quit January of 2015  Vaping Use   Vaping status: Never Used  Substance and Sexual Activity   Alcohol use: No    Alcohol/week: 0.0 standard drinks of alcohol    Comment: He drinks rarely    Drug use: No   Sexual activity: Yes    Birth control/protection: Surgical    Comment: Wife had a BTL  Other Topics Concern   Not on file  Social History Narrative   Marital Status: Married [place])   Children:  Son Alm), Daughter Elta)    Pets: None   Living Situation: Lives with spouse, son and daughter    Occupation: Social Research Officer, 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  He has smoked at least 20 years.     Alcohol Use:  Rarely    Exercise:  Limited    Hobbies: Music            Social Drivers of Health   Financial Resource Strain: Not on file  Food Insecurity: Not on file  Transportation Needs: Not on file  Physical Activity: Not on file  Stress: Not on file  Social Connections: Not on file   Today's Vitals   07/04/24 1309  BP: 120/80  Pulse: (!) 51  Resp: 16  Temp: 97.8 F (36.6 C)  TempSrc: Temporal  SpO2: 99%  Weight: 127 lb 6.4 oz (57.8 kg)  Height: 5' 6 (1.676 m)   Body mass index is 20.56 kg/m.  Wt Readings from Last 3 Encounters:  07/04/24 127 lb 6.4 oz (57.8 kg)  05/19/24 126 lb (57.2 kg)  02/17/24 129 lb 11.2 oz (58.8 kg)   Physical Exam Vitals and nursing note reviewed.  Constitutional:      General: He is not in acute distress.    Appearance: He is well-developed.  HENT:     Head: Normocephalic and atraumatic.     Right Ear: Tympanic membrane, ear canal and external ear normal.     Left Ear: Tympanic membrane, ear canal and external ear normal.     Mouth/Throat:     Mouth: Mucous membranes are moist.     Pharynx: Oropharynx is clear.  Eyes:     Extraocular Movements: Extraocular movements intact.     Conjunctiva/sclera: Conjunctivae normal.     Pupils: Pupils are equal, round, and reactive to light.  Neck:     Thyroid : No thyroid  mass or thyromegaly.  Cardiovascular:     Rate and Rhythm: Regular rhythm. Bradycardia present.     Pulses:          Dorsalis pedis pulses are 2+ on the right side and 2+ on the left side.     Heart sounds: No murmur heard. Pulmonary:     Effort: Pulmonary effort is normal. No respiratory distress.     Breath sounds: Normal breath sounds.  Abdominal:  Palpations: Abdomen is soft. There is no hepatomegaly or mass.     Tenderness: There is no abdominal tenderness.  Genitourinary:    Comments: No concerns. Musculoskeletal:         General: No tenderness.     Cervical back: Normal range of motion.     Lumbar back: No tenderness or bony tenderness.     Comments: No signs of synovitis.  Lymphadenopathy:     Cervical: No cervical adenopathy.     Upper Body:     Right upper body: No supraclavicular adenopathy.     Left upper body: No supraclavicular adenopathy.  Skin:    General: Skin is warm.     Findings: No erythema.  Neurological:     General: No focal deficit present.     Mental Status: He is alert and oriented to person, place, and time.     Cranial Nerves: No cranial nerve deficit.     Sensory: No sensory deficit.     Gait: Gait normal.     Deep Tendon Reflexes:     Reflex Scores:      Bicep reflexes are 2+ on the right side and 2+ on the left side.      Patellar reflexes are 2+ on the right side and 2+ on the left side. Psychiatric:        Mood and Affect: Mood and affect normal.   ASSESSMENT AND PLAN:  Mr. Holcomb was seen today for his routine annual physical examination.   Orders Placed This Encounter  Procedures   Microalbumin / creatinine urine ratio   Lab Results  Component Value Date   HGBA1C 5.8 (A) 07/04/2024   Lab Results  Component Value Date   MICROALBUR <0.7 07/04/2024   Routine general medical examination at a health care facility Assessment & Plan: We discussed the importance of regular physical activity and healthy diet for prevention of chronic illness and/or complications. Preventive guidelines reviewed. Vaccination: Prevnar 20 and Tdap given today. Prefers to hold on shingrix and does not want flu vaccine. Next CPE in a year.   Stage 3a chronic kidney disease (HCC) Assessment & Plan: Cr 1.4-1.6 and e GFT 44-52. Continue adequate hydration, low salt diet, good BP controlled,and avoidance of NSAID's. On Telmisartan .  Orders: -     Microalbumin / creatinine urine ratio; Future -     VITAMIN D  25 Hydroxy (Vit-D Deficiency, Fractures); Future  Essential hypertension,  benign Assessment & Plan: BP adequately controlled. Continue Telmisartan  40 mg daily and low salt diet.  Orders: -     Telmisartan ; Take 1 tablet (40 mg total) by mouth daily.  Dispense: 90 tablet; Refill: 3  Screening for lipoid disorders -     Lipid panel; Future  Screening for endocrine, metabolic and immunity disorder -     POCT glycosylated hemoglobin (Hb A1C)  Immunization due -     Pneumococcal conjugate vaccine 20-valent -     Tdap vaccine greater than or equal to 7yo IM  Lab orders placed, so can be added to next blood work at his oncologist's office.  Return in 1 year (on 07/04/2025) for CPE.  Beretta Ginsberg G. Briya Lookabaugh, MD  [facility] [place] office.

## 2024-07-04 NOTE — Assessment & Plan Note (Signed)
 BP adequately controlled. Continue Telmisartan  40 mg daily and low salt diet.

## 2024-07-12 ENCOUNTER — Telehealth: Payer: Self-pay | Admitting: Gastroenterology

## 2024-07-12 NOTE — Telephone Encounter (Signed)
 Good afternoon Dr. San,   I received a call from this patient stating that he would like to know if it is possible to have his EGD done this year. Would you please advise on how to schedule this patient.   Thank you.

## 2024-07-13 ENCOUNTER — Encounter: Payer: Self-pay | Admitting: Gastroenterology

## 2024-08-11 ENCOUNTER — Ambulatory Visit (HOSPITAL_BASED_OUTPATIENT_CLINIC_OR_DEPARTMENT_OTHER)
Admission: RE | Admit: 2024-08-11 | Discharge: 2024-08-11 | Attending: Hematology & Oncology | Admitting: Hematology & Oncology

## 2024-08-11 DIAGNOSIS — C689 Malignant neoplasm of urinary organ, unspecified: Secondary | ICD-10-CM

## 2024-08-11 DIAGNOSIS — C178 Malignant neoplasm of overlapping sites of small intestine: Secondary | ICD-10-CM

## 2024-08-11 DIAGNOSIS — C162 Malignant neoplasm of body of stomach: Secondary | ICD-10-CM

## 2024-08-11 LAB — POCT I-STAT CREATININE: Creatinine, Ser: 1.9 mg/dL — ABNORMAL HIGH (ref 0.61–1.24)

## 2024-08-11 MED ORDER — BARIUM SULFATE 2 % PO SUSP
450.0000 mL | Freq: Once | ORAL | Status: AC
  Administered 2024-08-11: 450 mL via ORAL

## 2024-08-11 MED ORDER — IOHEXOL 300 MG/ML  SOLN
100.0000 mL | Freq: Once | INTRAMUSCULAR | Status: AC | PRN
  Administered 2024-08-11: 80 mL via INTRAVENOUS

## 2024-08-16 ENCOUNTER — Ambulatory Visit: Payer: Self-pay | Admitting: Hematology & Oncology

## 2024-08-18 ENCOUNTER — Inpatient Hospital Stay: Attending: Hematology & Oncology

## 2024-08-18 ENCOUNTER — Other Ambulatory Visit: Payer: Self-pay

## 2024-08-18 ENCOUNTER — Ambulatory Visit: Admitting: Hematology & Oncology

## 2024-08-18 ENCOUNTER — Encounter: Payer: Self-pay | Admitting: Hematology & Oncology

## 2024-08-18 VITALS — BP 128/82 | HR 55 | Temp 98.1°F | Resp 18 | Ht 66.0 in | Wt 125.0 lb

## 2024-08-18 DIAGNOSIS — C178 Malignant neoplasm of overlapping sites of small intestine: Secondary | ICD-10-CM

## 2024-08-18 DIAGNOSIS — Z7901 Long term (current) use of anticoagulants: Secondary | ICD-10-CM | POA: Insufficient documentation

## 2024-08-18 DIAGNOSIS — Z8554 Personal history of malignant neoplasm of ureter: Secondary | ICD-10-CM | POA: Insufficient documentation

## 2024-08-18 DIAGNOSIS — C689 Malignant neoplasm of urinary organ, unspecified: Secondary | ICD-10-CM | POA: Diagnosis not present

## 2024-08-18 DIAGNOSIS — Z85028 Personal history of other malignant neoplasm of stomach: Secondary | ICD-10-CM | POA: Diagnosis not present

## 2024-08-18 DIAGNOSIS — C162 Malignant neoplasm of body of stomach: Secondary | ICD-10-CM

## 2024-08-18 DIAGNOSIS — Z9221 Personal history of antineoplastic chemotherapy: Secondary | ICD-10-CM | POA: Insufficient documentation

## 2024-08-18 DIAGNOSIS — Z1509 Genetic susceptibility to other malignant neoplasm: Secondary | ICD-10-CM | POA: Diagnosis not present

## 2024-08-18 DIAGNOSIS — Z08 Encounter for follow-up examination after completed treatment for malignant neoplasm: Secondary | ICD-10-CM | POA: Insufficient documentation

## 2024-08-18 DIAGNOSIS — Z85038 Personal history of other malignant neoplasm of large intestine: Secondary | ICD-10-CM | POA: Diagnosis present

## 2024-08-18 LAB — CBC WITH DIFFERENTIAL (CANCER CENTER ONLY)
Abs Immature Granulocytes: 0.02 K/uL (ref 0.00–0.07)
Basophils Absolute: 0 K/uL (ref 0.0–0.1)
Basophils Relative: 1 %
Eosinophils Absolute: 0.4 K/uL (ref 0.0–0.5)
Eosinophils Relative: 7 %
HCT: 41.8 % (ref 39.0–52.0)
Hemoglobin: 14.4 g/dL (ref 13.0–17.0)
Immature Granulocytes: 0 %
Lymphocytes Relative: 25 %
Lymphs Abs: 1.3 K/uL (ref 0.7–4.0)
MCH: 30.8 pg (ref 26.0–34.0)
MCHC: 34.4 g/dL (ref 30.0–36.0)
MCV: 89.5 fL (ref 80.0–100.0)
Monocytes Absolute: 0.3 K/uL (ref 0.1–1.0)
Monocytes Relative: 6 %
Neutro Abs: 3.1 K/uL (ref 1.7–7.7)
Neutrophils Relative %: 61 %
Platelet Count: 140 K/uL — ABNORMAL LOW (ref 150–400)
RBC: 4.67 MIL/uL (ref 4.22–5.81)
RDW: 13.2 % (ref 11.5–15.5)
WBC Count: 5.2 K/uL (ref 4.0–10.5)
nRBC: 0 % (ref 0.0–0.2)

## 2024-08-18 LAB — CMP (CANCER CENTER ONLY)
ALT: 22 U/L (ref 0–44)
AST: 28 U/L (ref 15–41)
Albumin: 4.5 g/dL (ref 3.5–5.0)
Alkaline Phosphatase: 125 U/L (ref 38–126)
Anion gap: 11 (ref 5–15)
BUN: 13 mg/dL (ref 6–20)
CO2: 24 mmol/L (ref 22–32)
Calcium: 9.2 mg/dL (ref 8.9–10.3)
Chloride: 105 mmol/L (ref 98–111)
Creatinine: 1.6 mg/dL — ABNORMAL HIGH (ref 0.61–1.24)
GFR, Estimated: 51 mL/min — ABNORMAL LOW (ref >60)
Glucose, Bld: 100 mg/dL — ABNORMAL HIGH (ref 70–99)
Potassium: 4.2 mmol/L (ref 3.5–5.1)
Sodium: 139 mmol/L (ref 135–145)
Total Bilirubin: 0.8 mg/dL (ref 0.0–1.2)
Total Protein: 7.4 g/dL (ref 6.5–8.1)

## 2024-08-18 LAB — IRON AND IRON BINDING CAPACITY (CC-WL,HP ONLY)
Iron: 95 ug/dL (ref 45–182)
Saturation Ratios: 21 % (ref 17.9–39.5)
TIBC: 455 ug/dL — ABNORMAL HIGH (ref 250–450)
UIBC: 360 ug/dL

## 2024-08-18 LAB — FERRITIN: Ferritin: 37 ng/mL (ref 24–336)

## 2024-08-18 NOTE — Progress Notes (Signed)
 Hematology and Oncology Follow Up Visit  Keith [name] [id number] [birth year] 53 y.o. 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  We last saw him back in September.  Since then, he has been doing pretty well.  His blood pressure is doing a lot better now that he is on blood pressure medication.  His wife and son are on a plane to [place] right now.  It is her mom's 80th birthday.  They will be gone for 3 weeks.  We did do a CT scan on him.  This was done on 08/11/2024.  The CT scan did not show any evidence of residual or recurrent disease.  He does have the Lynch syndrome so he does have history of multiple malignancies.  He has had no change in bowel or bladder habits.  He has had no bleeding.  He has had no rashes.  He has had little bit of discomfort in the left lateral upper rib cage.  I am not sure exactly what this could represent.  Again, there was nothing on the CT scan.  He is eating okay.  He is exercising quite a bit.  He runs a lot.  Overall, I will say that his performance status is probably ECOG 0.    Medications:  Allergies as of 08/18/2024   No Known Allergies      Medication List        Accurate as of August 18, 2024 10:45 AM. If you have any questions, ask your nurse or doctor.          acetaminophen  325 MG tablet Commonly known as: TYLENOL  Take 650 mg by mouth every 6 (six)  hours as needed for headache.   carboxymethylcellulose 0.5 % Soln Commonly known as: REFRESH PLUS Place 1 drop into both eyes 3 (three) times daily as needed (dry/irritated eyes.).   co-enzyme Q-10 30 MG capsule Take 30 mg by mouth as needed. 08/07/2020 Takes 2 times/weekly.   lipase/protease/amylase 63999 UNITS Cpep capsule Commonly known as: Creon  Take 1 capsule (36,000 Units total) by mouth 3 (three) times daily with meals.   telmisartan  40 MG tablet Commonly known as: MICARDIS  Take 1 tablet (40 mg total) by mouth daily.   VITAMIN B-12 PO Take by mouth daily.   VITAMIN D  (ERGOCALCIFEROL ) PO Take by mouth daily. 01/19/2022 Patient does not know dose.        Allergies: No Known Allergies  Past Medical History, Surgical history, Social history, and Family History were reviewed and updated.  Review of Systems: Review of Systems  Constitutional:  Positive for malaise/fatigue.  HENT: Negative.    Eyes: Negative.   Respiratory: Negative.    Cardiovascular: Negative.   Gastrointestinal: Negative.   Genitourinary: Negative.   Musculoskeletal: Negative.   Skin:  Positive for rash.  Neurological: Negative.   Endo/Heme/Allergies: Negative.   Psychiatric/Behavioral: Negative.  Physical Exam:  height is 5' 6 (1.676 m) and weight is 125 lb (56.7 kg). His oral temperature is 98.1 F (36.7 C). His blood pressure is 128/82 and his pulse is 55 (abnormal). His respiration is 18 and oxygen saturation is 100%.   Wt Readings from Last 3 Encounters:  08/18/24 125 lb (56.7 kg)  07/04/24 127 lb 6.4 oz (57.8 kg)  05/19/24 126 lb (57.2 kg)    Physical Exam Vitals reviewed.  HENT:     Head: Normocephalic and atraumatic.  Eyes:     Pupils: Pupils are equal, round, and reactive to light.  Cardiovascular:     Rate and Rhythm: Normal rate and regular rhythm.     Heart sounds: Normal heart sounds.  Pulmonary:     Effort: Pulmonary effort is normal.     Breath sounds: Normal  breath sounds.  Abdominal:     General: Bowel sounds are normal.     Palpations: Abdomen is soft.     Comments: Abdominal exam shows a well-healed laparotomy scar.  He has no masses.  There is no fluid wave.  There is no palpable hepatosplenomegaly.  Musculoskeletal:        General: No tenderness or deformity. Normal range of motion.     Cervical back: Normal range of motion.  Lymphadenopathy:     Cervical: No cervical adenopathy.  Skin:    General: Skin is warm and dry.     Findings: No erythema or rash.     Comments: In the right suprapubic area, there is a nodular dark lesion.  It probably measures about 3 x 3 mm.  It is nontender.  Neurological:     Mental Status: He is alert and oriented to person, place, and time.  Psychiatric:        Behavior: Behavior normal.        Thought Content: Thought content normal.        Judgment: Judgment normal.      Lab Results  Component Value Date   WBC 5.2 08/18/2024   HGB 14.4 08/18/2024   HCT 41.8 08/18/2024   MCV 89.5 08/18/2024   PLT 140 (L) 08/18/2024   Lab Results  Component Value Date   FERRITIN 20 (L) 05/22/2021   IRON 71 05/22/2021   TIBC 384 05/22/2021   UIBC 313 05/22/2021   IRONPCTSAT 18 (L) 05/22/2021   Lab Results  Component Value Date   RETICCTPCT 1.6 05/22/2021   RBC 4.67 08/18/2024   No results found for: KPAFRELGTCHN, LAMBDASER, KAPLAMBRATIO No results found for: IGGSERUM, IGA, IGMSERUM No results found for: STEPHANY CARLOTA BENSON MARKEL EARLA JOANNIE DOC VICK, SPEI   Chemistry      Component Value Date/Time   NA 139 08/18/2024 0923   NA 147 (H) 08/06/2017 0816   NA 144 02/04/2017 0758   K 4.2 08/18/2024 0923   K 4.6 08/06/2017 0816   K 4.6 02/04/2017 0758   CL 105 08/18/2024 0923   CL 104 08/06/2017 0816   CO2 24 08/18/2024 0923   CO2 30 08/06/2017 0816   CO2 25 02/04/2017 0758   BUN 13 08/18/2024 0923   BUN 12 08/06/2017 0816   BUN 14.2 02/04/2017  0758   CREATININE 1.60 (H) 08/18/2024 0923   CREATININE 0.9 08/06/2017 0816   CREATININE 0.9 02/04/2017 0758      Component Value Date/Time   CALCIUM 9.2 08/18/2024 0923   CALCIUM 9.0 08/06/2017 0816   CALCIUM 8.9 02/04/2017 0758   ALKPHOS 125  08/18/2024 0923   ALKPHOS 108 (H) 08/06/2017 0816   ALKPHOS 113 02/04/2017 0758   AST 28 08/18/2024 0923   AST 24 02/04/2017 0758   ALT 22 08/18/2024 0923   ALT 27 08/06/2017 0816   ALT 20 02/04/2017 0758   BILITOT 0.8 08/18/2024 0923   BILITOT 0.91 02/04/2017 0758       Impression and Plan: Mr. Laredo is 53 yo [place] gentleman returns for a follow up for history of multiple maglinancies in the setting of Lynch syndrome.  I do not see any problems with recurrence of his small bowel adenocarcinoma nor the left ureteral cancer.  He also has had stomach cancer.    I think we will probably get him back now in 6 months.  I really do not think we need another scan on him probably for maybe a year.  I know that he will have a wonderful Christmas.  He just had his 53th birthday.  He had a nice celebration.      Maude JONELLE Crease, MD 12/12/202510:45 AM

## 2024-08-23 ENCOUNTER — Ambulatory Visit

## 2024-08-23 VITALS — Ht 66.0 in | Wt 126.2 lb

## 2024-08-23 DIAGNOSIS — Z1509 Genetic susceptibility to other malignant neoplasm: Secondary | ICD-10-CM

## 2024-08-23 DIAGNOSIS — Z1507 Genetic susceptibility to malignant neoplasm of urinary tract: Secondary | ICD-10-CM

## 2024-08-23 DIAGNOSIS — Z15068 Genetic susceptibility to other malignant neoplasm of digestive system: Secondary | ICD-10-CM

## 2024-08-23 DIAGNOSIS — Z800 Family history of malignant neoplasm of digestive organs: Secondary | ICD-10-CM

## 2024-08-23 DIAGNOSIS — Z15060 Genetic susceptibility to colorectal cancer: Secondary | ICD-10-CM

## 2024-08-23 DIAGNOSIS — Z85038 Personal history of other malignant neoplasm of large intestine: Secondary | ICD-10-CM

## 2024-08-23 MED ORDER — NA SULFATE-K SULFATE-MG SULF 17.5-3.13-1.6 GM/177ML PO SOLN: 1.0000 | Freq: Once | ORAL | 0 refills | Status: AC

## 2024-08-23 NOTE — Progress Notes (Signed)

## 2024-09-04 ENCOUNTER — Ambulatory Visit: Admitting: Gastroenterology

## 2024-09-04 ENCOUNTER — Encounter: Payer: Self-pay | Admitting: Gastroenterology

## 2024-09-04 VITALS — BP 108/60 | HR 60 | Temp 97.2°F | Resp 20 | Ht 66.0 in | Wt 126.2 lb

## 2024-09-04 DIAGNOSIS — D125 Benign neoplasm of sigmoid colon: Secondary | ICD-10-CM

## 2024-09-04 DIAGNOSIS — Z1211 Encounter for screening for malignant neoplasm of colon: Secondary | ICD-10-CM

## 2024-09-04 DIAGNOSIS — K289 Gastrojejunal ulcer, unspecified as acute or chronic, without hemorrhage or perforation: Secondary | ICD-10-CM | POA: Diagnosis not present

## 2024-09-04 DIAGNOSIS — Z860100 Personal history of colon polyps, unspecified: Secondary | ICD-10-CM | POA: Diagnosis not present

## 2024-09-04 DIAGNOSIS — K648 Other hemorrhoids: Secondary | ICD-10-CM | POA: Diagnosis not present

## 2024-09-04 DIAGNOSIS — K9189 Other postprocedural complications and disorders of digestive system: Secondary | ICD-10-CM | POA: Diagnosis not present

## 2024-09-04 DIAGNOSIS — C187 Malignant neoplasm of sigmoid colon: Secondary | ICD-10-CM

## 2024-09-04 DIAGNOSIS — D122 Benign neoplasm of ascending colon: Secondary | ICD-10-CM

## 2024-09-04 DIAGNOSIS — K317 Polyp of stomach and duodenum: Secondary | ICD-10-CM | POA: Diagnosis not present

## 2024-09-04 DIAGNOSIS — D120 Benign neoplasm of cecum: Secondary | ICD-10-CM

## 2024-09-04 DIAGNOSIS — Z85028 Personal history of other malignant neoplasm of stomach: Secondary | ICD-10-CM

## 2024-09-04 DIAGNOSIS — K641 Second degree hemorrhoids: Secondary | ICD-10-CM

## 2024-09-04 DIAGNOSIS — Z800 Family history of malignant neoplasm of digestive organs: Secondary | ICD-10-CM

## 2024-09-04 DIAGNOSIS — Z15068 Genetic susceptibility to other malignant neoplasm of digestive system: Secondary | ICD-10-CM

## 2024-09-04 MED ORDER — SODIUM CHLORIDE 0.9 % IV SOLN: 500.0000 mL | INTRAVENOUS | Status: DC

## 2024-09-04 NOTE — Op Note (Signed)
 [facility] Patient Name: Keith [name] Procedure Date: 09/04/2024 11:07 AM MRN: [id number] Endoscopist: Sandor Flatter , MD, [id number] Age: 53 Referring MD:  Date of Birth: [birth year] Gender: Male Account #: [id number] Procedure:                Colonoscopy Indications:              High risk colon cancer surveillance: Personal                            history of colonic polyps, Lynch Syndrome Medicines:                Monitored Anesthesia Care Procedure:                Pre-Anesthesia Assessment:                           - Prior to the procedure, a History and Physical                            was performed, and patient medications and                            allergies were reviewed. The patient's tolerance of                            previous anesthesia was also reviewed. The risks                            and benefits of the procedure and the sedation                            options and risks were discussed with the patient.                            All questions were answered, and informed consent                            was obtained. Prior Anticoagulants: The patient has                            taken no anticoagulant or antiplatelet agents. ASA                            Grade Assessment: III - A patient with severe                            systemic disease. After reviewing the risks and                            benefits, the patient was deemed in satisfactory                            condition to undergo the procedure.  After obtaining informed consent, the colonoscope                            was passed under direct vision. Throughout the                            procedure, the patient's blood pressure, pulse, and                            oxygen saturations were monitored continuously. The                            PCF-H190TL Slim SN [id number] was introduced through                            the anus and  advanced to the the terminal ileum.                            The colonoscopy was performed without difficulty.                            The patient tolerated the procedure well. The                            quality of the bowel preparation was good. The                            terminal ileum, ileocecal valve, appendiceal                            orifice, and rectum were photographed. Scope In: 11:30:51 AM Scope Out: 12:13:46 PM Scope Withdrawal Time: 0 hours 33 minutes 2 seconds  Total Procedure Duration: 0 hours 42 minutes 55 seconds  Findings:                 The perianal and digital rectal examinations were                            normal.                           Six sessile polyps were found in the ascending                            colon and cecum. The polyps were 2 to 4 mm in size.                            These polyps were removed with a cold snare.                            Resection and retrieval were complete. Estimated                            blood loss was minimal.  A 15 mm polyp was found in the sigmoid colon. The                            polyp was sessile. The polyp was removed with a                            cold snare. Resection and retrieval were complete.                            To close a defect after polypectomy, two hemostatic                            clips were successfully placed (MR conditional).                            There was no bleeding at the end of the procedure.                           There was evidence of a prior end-to-end                            colo-colonic anastomosis in the descending colon.                            This was patent and was characterized by healthy                            appearing mucosa. The anastomosis was traversed.                           Retroflexion in the right colon was performed.                           Non-bleeding internal hemorrhoids were found  during                            retroflexion. The hemorrhoids were small.                           The terminal ileum appeared normal. Complications:            No immediate complications. Estimated Blood Loss:     Estimated blood loss was minimal. Impression:               - Six 2 to 4 mm polyps in the ascending colon and                            in the cecum, removed with a cold snare. Resected                            and retrieved.                           - One 15 mm polyp in  the sigmoid colon, removed                            with a cold snare. Resected and retrieved. Clips                            (MR conditional) were placed.                           - Patent end-to-end colo-colonic anastomosis,                            characterized by healthy appearing mucosa.                           - Non-bleeding internal hemorrhoids.                           - The examined portion of the ileum was normal. Recommendation:           - Patient has a contact number available for                            emergencies. The signs and symptoms of potential                            delayed complications were discussed with the                            patient. Return to normal activities tomorrow.                            Written discharge instructions were provided to the                            patient.                           - Resume previous diet.                           - Continue present medications.                           - Await pathology results.                           - Repeat colonoscopy for surveillance based on                            pathology results.                           - Return to GI clinic in 1 year. Sandor Flatter, MD 09/04/2024 12:32:27 PM

## 2024-09-04 NOTE — Progress Notes (Signed)
 VS by EJ  Pt's states no medical or surgical changes since previsit or office visit.

## 2024-09-04 NOTE — Progress Notes (Signed)
 "   GASTROENTEROLOGY PROCEDURE H&P NOTE   Primary Care Physician: Jordan, Betty G, MD    Reason for Procedure:  Lynch syndrome, history of gastric cancer, history of small bowel cancer, history of colon polyps history of ureteral cancer, distal transverse colectomy  Plan:    EGD, colonoscopy  Patient is appropriate for endoscopic procedure(s) in the ambulatory ([facility]) setting.  The nature of the procedure, as well as the risks, benefits, and alternatives were carefully and thoroughly reviewed with the patient. Ample time for discussion and questions allowed. The patient understood, was satisfied, and agreed to proceed. I personally addressed all patient questions and concerns.     HPI: Keith [name] is a 53 y.o. male who presents for EGD and colonoscopy for continued surveillance.  History of Lynch syndrome with previous gas cancer and small bowel cancer, along with history of colon polyps.  History of ureteral cancer.  Recent Endoscopic History: - EGD (07/2020): Whipple anatomy, 10 mm marginal ulcer (path: Adenocarcinoma with signet ring features), mild gastritis.  Normal small bowel - Colonoscopy (07/2020): 3 subcentimeter polyps (path: Tubular adenomas), Tortuous colon, internal hemorrhoids.  Normal TI.  Repeat in 3 years - EGD (12/2021):Evidence of prior Whipple.  Edema and erythema at anastomosis with retropulsion of small bowel into the gastric folds.  Biopsies with benign inflammatory change and no dysplasia or malignancy.  Remainder of the small bowel normal including visualization of the jejunojejunal anastomosis.  Otherwise normal gastric pouch.  Repeat upper endoscopy in 3 years for surveillance - Colonoscopy (12/2021):Healthy-appearing colocolonic anastomosis at 45 cm.  Otherwise normal colon.  Significantly tortuous sigmoid.  Normal TI.  Repeat in 2 years for ongoing screening with plan for ultrathin colonoscope  Past Medical History:  Diagnosis Date   Acute thrombosis of cephalic  vein, right 03/25/2020   Blood transfusion without reported diagnosis    Cancer of body of stomach (HCC) 10/25/2020   Cancer of small intestine, including duodenum (HCC) 10/26/2013   small intestine adenocarcinoma stage IIIc    CHF (congestive heart failure) (HCC)    patient denies   Chronic hepatitis B (HCC)    Diabetes mellitus without complication (HCC)    patient denies   Family history of cancer of genitourinary system    Family history of esophageal cancer    GERD (gastroesophageal reflux disease)    remote history   Hydronephrosis of left kidney    Hypertension    no medications at this time   Impaired fasting glucose    Lung nodules    2 mm nodule in the posterior left lower lobe (4/115), stable. 4 mm ground-glass nodule in the apical segment right upper lobe (4/30), unchanged as well.   Platelets decreased    Radiation 01/31/14-03/06/14   upper abdomen/surgical bed   Urothelial cancer (HCC) 12/20/2019    Past Surgical History:  Procedure Laterality Date   ABDOMINAL EXPLORATION SURGERY  09/19/2013   done in [place]   APPENDECTOMY  1997   BACK SURGERY     tumor excision on back   COLONOSCOPY  2015   CYSTOSCOPY WITH RETROGRADE PYELOGRAM, URETEROSCOPY AND STENT PLACEMENT Left 10/26/2019   Procedure: CYSTOSCOPY WITH LEFT RETROGRADE PYELOGRAM, URETEROSCOPY;  Surgeon: Matilda Senior, MD;  Location: [facility];  Service: Urology;  Laterality: Left;  1 HR   PANCREATICODUODENECTOMY  09/19/2013   PARTIAL GASTRECTOMY  09/19/2013   UPPER GI ENDOSCOPY     XI ROBOTICALLY ASSISTED LAPAROSCOPIC URETERAL RE-IMPLANTATION   12/06/2019    Prior to Admission medications  Medication Sig Start Date End Date Taking? Authorizing Provider  co-enzyme Q-10 30 MG capsule Take 30 mg by mouth as needed. 08/07/2020 Takes 2 times/weekly.   Yes [provider]  Cyanocobalamin  (VITAMIN B-12 PO) Take by mouth daily.   Yes [provider]  telmisartan  (MICARDIS ) 40 MG tablet Take 1 tablet  (40 mg total) by mouth daily. 07/04/24  Yes Jordan, Betty G, MD  VITAMIN D , ERGOCALCIFEROL , PO Take by mouth daily. 01/19/2022 Patient does not know dose.   Yes [provider]  acetaminophen  (TYLENOL ) 325 MG tablet Take 650 mg by mouth every 6 (six) hours as needed for headache. Patient not taking: No sig reported 12/29/20   [provider]  carboxymethylcellulose (REFRESH PLUS) 0.5 % SOLN Place 1 drop into both eyes 3 (three) times daily as needed (dry/irritated eyes.).     [provider]    Current Outpatient Medications  Medication Sig Dispense Refill   co-enzyme Q-10 30 MG capsule Take 30 mg by mouth as needed. 08/07/2020 Takes 2 times/weekly.     Cyanocobalamin  (VITAMIN B-12 PO) Take by mouth daily.     telmisartan  (MICARDIS ) 40 MG tablet Take 1 tablet (40 mg total) by mouth daily. 90 tablet 3   VITAMIN D , ERGOCALCIFEROL , PO Take by mouth daily. 01/19/2022 Patient does not know dose.     acetaminophen  (TYLENOL ) 325 MG tablet Take 650 mg by mouth every 6 (six) hours as needed for headache. (Patient not taking: No sig reported)     carboxymethylcellulose (REFRESH PLUS) 0.5 % SOLN Place 1 drop into both eyes 3 (three) times daily as needed (dry/irritated eyes.).      Current Facility-Administered Medications  Medication Dose Route Frequency Provider Last Rate Last Admin   0.9 %  sodium chloride  infusion  500 mL Intravenous Continuous Olman Yono V, DO        Allergies as of 09/04/2024   (No Known Allergies)    Family History  Problem Relation Age of Onset   Cancer Mother 74       Ureter   Hypertension Other    Colon cancer Sister 35   Esophageal cancer Father 54   Colon cancer Cousin 24       paternal first cousin   Cancer Sister 22       Ureter   Rectal cancer Neg Hx    Stomach cancer Neg Hx     Social History   Socioeconomic History   Marital status: Married    Spouse name: [name]    Number of children: 2   Years of education: 16    Highest education level: Not on file  Occupational History   Occupation: TECHNICAL SUPPORT     Employer: [employer]    Comment: [employer]   Tobacco Use   Smoking status: Former    Current packs/day: 0.00    Average packs/day: 0.3 packs/day for 27.0 years (6.8 ttl pk-yrs)    Types: Cigarettes    Start date: 09/07/1986    Quit date: 09/13/2013    Years since quitting: 10.9   Smokeless tobacco: Never   Tobacco comments:    quit January of 2015  Vaping Use   Vaping status: Never Used  Substance and Sexual Activity   Alcohol use: No    Alcohol/week: 0.0 standard drinks of alcohol    Comment: He drinks rarely    Drug use: No   Sexual activity: Yes    Birth control/protection: Surgical    Comment: Wife  had a BTL  Other Topics Concern   Not on file  Social History Narrative   Marital Status: Married [place])   Children:  Son Alm), Daughter Elta)    Pets: None   Living Situation: Lives with spouse, son and daughter    Occupation: Social Research Officer, 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  He has smoked at least 20 years.     Alcohol Use:  Rarely    Exercise:  Limited    Hobbies: Music            Social Drivers of Health   Tobacco Use: Medium Risk (09/04/2024)   Patient History    Smoking Tobacco Use: Former    Smokeless Tobacco Use: Never    Passive Exposure: Not on [place]uary Strain: Not on file  Food Insecurity: Not on file  Transportation Needs: Not on file  Physical Activity: Not on file  Stress: Not on file  Social Connections: Not on file  Intimate Partner Violence: Not on file  Depression (PHQ2-9): Low Risk (08/18/2024)   Depression (PHQ2-9)    PHQ-2 Score: 0  Alcohol Screen: Not on file  Housing: Not on file  Utilities: Not on file  Health Literacy: Not on file    Physical Exam: Vital signs in last 24 hours: @BP  123/80   Pulse 86   Temp (!) 97.2  F (36.2 C)   Ht 5' 6 (1.676 m)   Wt 126 lb 3.2 oz (57.2 kg)   SpO2 99%   BMI 20.37 kg/m  GEN: NAD EYE: Sclerae anicteric ENT: MMM CV: Non-tachycardic Pulm: CTA b/l GI: Soft, NT/ND NEURO:  Alert & Oriented x 3   Sandor Flatter, DO [facility]   09/04/2024 11:07 AM  "

## 2024-09-04 NOTE — Progress Notes (Signed)
 Sedate, gd SR, tolerated procedure well, VSS, report to RN

## 2024-09-04 NOTE — Op Note (Signed)
 [facility] Patient Name: Keith [name] Procedure Date: 09/04/2024 11:08 AM MRN: [id number] Endoscopist: Sandor Flatter , MD, [id number] Age: 53 Referring MD:  Date of Birth: December 12, 1970 Gender: Male Account #: [id number] Procedure:                Upper GI endoscopy Indications:              Hereditary nonpolyposis colorectal cancer (Lynch                            Syndrome), history of gastric cancer, history of                            small bowel cancer Medicines:                Monitored Anesthesia Care Procedure:                Pre-Anesthesia Assessment:                           - Prior to the procedure, a History and Physical                            was performed, and patient medications and                            allergies were reviewed. The patient's tolerance of                            previous anesthesia was also reviewed. The risks                            and benefits of the procedure and the sedation                            options and risks were discussed with the patient.                            All questions were answered, and informed consent                            was obtained. Prior Anticoagulants: The patient has                            taken no anticoagulant or antiplatelet agents. ASA                            Grade Assessment: III - A patient with severe                            systemic disease. After reviewing the risks and                            benefits, the patient was deemed in satisfactory  condition to undergo the procedure.                           After obtaining informed consent, the endoscope was                            passed under direct vision. Throughout the                            procedure, the patient's blood pressure, pulse, and                            oxygen saturations were monitored continuously. The                            GIF [id number] #[id number] was introduced  through the                            mouth, and advanced to the jejunum. The upper GI                            endoscopy was accomplished without difficulty. The                            patient tolerated the procedure well. Scope In: Scope Out: Findings:                 The examined esophagus was normal.                           A single 3 mm sessile polyp with no stigmata of                            recent bleeding was found in the gastric body. The                            polyp was removed with a cold biopsy forceps.                            Resection and retrieval were complete. Estimated                            blood loss was minimal.                           Evidence of a gastric bypass was found. A gastric                            pouch with a normal size was found. The                            gastrojejunal anastomosis was characterized by  erosions, erythema, and friable mucosa at the                            anastamosis and extending a few centimeters into                            the jejunum. This was traversed. Biopsies were                            taken with a cold forceps for histology. Estimated                            blood loss was minimal.                           The examined jejunum was normal. The jejunojejunal                            anastomosis was characterized by healthy appearing                            mucosa. Complications:            No immediate complications. Estimated Blood Loss:     Estimated blood loss was minimal. Impression:               - Normal esophagus.                           - A single gastric polyp. Resected and retrieved.                           - Gastric bypass with a normal-sized pouch.                            Gastrojejunal anastomosis characterized by                            localized erosions, erythema, and friable mucosa.                            Biopsied.                            - Normal examined jejunum.                           - The jejunojejunal anastomosis was characterized                            by healthy appearing mucosa. Recommendation:           - Patient has a contact number available for                            emergencies. The signs and symptoms of potential  delayed complications were discussed with the                            patient. Return to normal activities tomorrow.                            Written discharge instructions were provided to the                            patient.                           - Resume previous diet.                           - Continue present medications.                           - Await pathology results.                           - Perform a colonoscopy today. Sandor Flatter, MD 09/04/2024 12:24:40 PM

## 2024-09-04 NOTE — Patient Instructions (Signed)

## 2024-09-04 NOTE — Progress Notes (Signed)
 Called to room to assist during endoscopic procedure.  Patient ID and intended procedure confirmed with present staff. Received instructions for my participation in the procedure from the performing physician.

## 2024-09-05 ENCOUNTER — Telehealth: Payer: Self-pay

## 2024-09-05 NOTE — Telephone Encounter (Signed)
 Left message on answering machine.

## 2024-09-08 ENCOUNTER — Ambulatory Visit: Payer: Self-pay | Admitting: Gastroenterology

## 2024-09-08 ENCOUNTER — Telehealth: Payer: Self-pay

## 2024-09-08 DIAGNOSIS — C187 Malignant neoplasm of sigmoid colon: Secondary | ICD-10-CM

## 2024-09-08 LAB — SURGICAL PATHOLOGY

## 2024-09-08 NOTE — Telephone Encounter (Signed)
 Scheduled 1-14 with patient will call Monday afternoon to go over everything

## 2024-09-08 NOTE — Telephone Encounter (Signed)
 Scheduled 1-14 with patient for procedure lab order placed and will call patient Monday afternoon to go over everything. Mailed instructions too

## 2024-09-08 NOTE — Telephone Encounter (Signed)
 Thank you.  I called the patient and reviewed all the results with him.  Will need to get him set up for expedited flexible sigmoidoscopy with tattoo placement with me along with sending to the lab for CEA level.

## 2024-09-08 NOTE — Telephone Encounter (Signed)
 Received call from Dr. 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 Please review and advise.

## 2024-09-11 NOTE — Telephone Encounter (Signed)
 Patient has been instructed was told to call with any questions or concerns by the 12th. Referral to [facility] was sent and marked as urgent  to [phone]. Patient said he will come on the day of the procedure to get the blood work. Told him to come at 130pm for the blood work on basement level and then head to the 4th floor to get checked in for the procedure

## 2024-09-12 ENCOUNTER — Telehealth: Payer: Self-pay

## 2024-09-12 NOTE — Telephone Encounter (Signed)
 Faxed 14 pages to Dr Debora at [facility], ATTN: Asberry Holms at [phone].  Per Tiffany at this office, they will call the patient with an appointment date and time.  Patient aware that someone will contact him with an appointment and if they have not called him within 1 to 2 weeks to contact our office.  Patient scheduled to see Dr Timmy on 09-15-24 at 2pm. Patient is aware of appointment date and time.  Patient agreed to plan and verbalized understanding.  No further questions or concerns.

## 2024-09-12 NOTE — Telephone Encounter (Signed)
 Scheduled with Dr.Thomas on 09/19/2024 at 9:40

## 2024-09-12 NOTE — Telephone Encounter (Signed)
 [facility] has refereral and are working on it

## 2024-09-13 ENCOUNTER — Encounter: Payer: Self-pay | Admitting: Gastroenterology

## 2024-09-15 ENCOUNTER — Inpatient Hospital Stay: Admitting: Hematology & Oncology

## 2024-09-15 ENCOUNTER — Encounter: Payer: Self-pay | Admitting: Hematology & Oncology

## 2024-09-15 ENCOUNTER — Other Ambulatory Visit: Payer: Self-pay

## 2024-09-15 ENCOUNTER — Inpatient Hospital Stay: Attending: Hematology & Oncology

## 2024-09-15 VITALS — BP 126/86 | HR 67 | Temp 98.2°F | Resp 18 | Ht 66.0 in | Wt 124.0 lb

## 2024-09-15 DIAGNOSIS — Z9189 Other specified personal risk factors, not elsewhere classified: Secondary | ICD-10-CM

## 2024-09-15 DIAGNOSIS — Z79620 Long term (current) use of immunosuppressive biologic: Secondary | ICD-10-CM | POA: Insufficient documentation

## 2024-09-15 DIAGNOSIS — Z1509 Genetic susceptibility to other malignant neoplasm: Secondary | ICD-10-CM | POA: Insufficient documentation

## 2024-09-15 DIAGNOSIS — Z9221 Personal history of antineoplastic chemotherapy: Secondary | ICD-10-CM | POA: Insufficient documentation

## 2024-09-15 DIAGNOSIS — Z85028 Personal history of other malignant neoplasm of stomach: Secondary | ICD-10-CM | POA: Diagnosis not present

## 2024-09-15 DIAGNOSIS — Z5112 Encounter for antineoplastic immunotherapy: Secondary | ICD-10-CM | POA: Insufficient documentation

## 2024-09-15 DIAGNOSIS — Z8554 Personal history of malignant neoplasm of ureter: Secondary | ICD-10-CM | POA: Insufficient documentation

## 2024-09-15 DIAGNOSIS — Z85038 Personal history of other malignant neoplasm of large intestine: Secondary | ICD-10-CM | POA: Diagnosis not present

## 2024-09-15 DIAGNOSIS — C162 Malignant neoplasm of body of stomach: Secondary | ICD-10-CM

## 2024-09-15 DIAGNOSIS — C689 Malignant neoplasm of urinary organ, unspecified: Secondary | ICD-10-CM

## 2024-09-15 DIAGNOSIS — C178 Malignant neoplasm of overlapping sites of small intestine: Secondary | ICD-10-CM

## 2024-09-15 DIAGNOSIS — C187 Malignant neoplasm of sigmoid colon: Secondary | ICD-10-CM | POA: Insufficient documentation

## 2024-09-15 LAB — CBC WITH DIFFERENTIAL (CANCER CENTER ONLY)
Abs Immature Granulocytes: 0.02 K/uL (ref 0.00–0.07)
Basophils Absolute: 0 K/uL (ref 0.0–0.1)
Basophils Relative: 1 %
Eosinophils Absolute: 0.3 K/uL (ref 0.0–0.5)
Eosinophils Relative: 7 %
HCT: 36.4 % — ABNORMAL LOW (ref 39.0–52.0)
Hemoglobin: 12.3 g/dL — ABNORMAL LOW (ref 13.0–17.0)
Immature Granulocytes: 0 %
Lymphocytes Relative: 25 %
Lymphs Abs: 1.2 K/uL (ref 0.7–4.0)
MCH: 30.7 pg (ref 26.0–34.0)
MCHC: 33.8 g/dL (ref 30.0–36.0)
MCV: 90.8 fL (ref 80.0–100.0)
Monocytes Absolute: 0.3 K/uL (ref 0.1–1.0)
Monocytes Relative: 6 %
Neutro Abs: 3 K/uL (ref 1.7–7.7)
Neutrophils Relative %: 61 %
Platelet Count: 127 K/uL — ABNORMAL LOW (ref 150–400)
RBC: 4.01 MIL/uL — ABNORMAL LOW (ref 4.22–5.81)
RDW: 12.9 % (ref 11.5–15.5)
WBC Count: 4.9 K/uL (ref 4.0–10.5)
nRBC: 0 % (ref 0.0–0.2)

## 2024-09-15 LAB — CMP (CANCER CENTER ONLY)
ALT: 18 U/L (ref 0–44)
AST: 26 U/L (ref 15–41)
Albumin: 4.1 g/dL (ref 3.5–5.0)
Alkaline Phosphatase: 116 U/L (ref 38–126)
Anion gap: 11 (ref 5–15)
BUN: 11 mg/dL (ref 6–20)
CO2: 22 mmol/L (ref 22–32)
Calcium: 8.4 mg/dL — ABNORMAL LOW (ref 8.9–10.3)
Chloride: 105 mmol/L (ref 98–111)
Creatinine: 1.55 mg/dL — ABNORMAL HIGH (ref 0.61–1.24)
GFR, Estimated: 53 mL/min — ABNORMAL LOW
Glucose, Bld: 254 mg/dL — ABNORMAL HIGH (ref 70–99)
Potassium: 4.3 mmol/L (ref 3.5–5.1)
Sodium: 137 mmol/L (ref 135–145)
Total Bilirubin: 0.7 mg/dL (ref 0.0–1.2)
Total Protein: 6.1 g/dL — ABNORMAL LOW (ref 6.5–8.1)

## 2024-09-15 LAB — LACTATE DEHYDROGENASE: LDH: 160 U/L (ref 105–235)

## 2024-09-15 LAB — CEA (ACCESS): CEA (CHCC): 5.99 ng/mL — ABNORMAL HIGH (ref 0.00–5.00)

## 2024-09-15 NOTE — Progress Notes (Signed)
 Name:Keith [name]       MRN: [id number] Age/Date of Birth: 54 y.o. [birth year]       Date of Contact: 09/20/2024 Location of Care: [facility]  Initial telephone call summary:  Navigator call intiated to Talitha Dutch before appointment.  Explained navigator role, provided my contact information, offered directions, and discussed patient's understanding of reason for appointment, barriers to attending appointments, and any other concerns related to appointment. Patient verbalized understanding of information discussed during today's call.  Oncology Navigator Initial Assessment Documentation:    The Distress Thermometer was administered : not administered  Asberry Jenna Holms, RN

## 2024-09-16 ENCOUNTER — Encounter: Payer: Self-pay | Admitting: Hematology & Oncology

## 2024-09-16 DIAGNOSIS — C189 Malignant neoplasm of colon, unspecified: Secondary | ICD-10-CM | POA: Insufficient documentation

## 2024-09-16 DIAGNOSIS — Z9189 Other specified personal risk factors, not elsewhere classified: Secondary | ICD-10-CM

## 2024-09-16 HISTORY — DX: Other specified personal risk factors, not elsewhere classified: Z91.89

## 2024-09-16 NOTE — Progress Notes (Signed)
 OFF PATHWAY REGIMEN - Bladder  No Change  Continue With Treatment as Ordered.  Original Decision Date/Time: 12/20/2019 08:48   [id number]:Gemcitabine  + Cisplatin  every 21 days:   A cycle is every 21 days:     Gemcitabine       Cisplatin    **Always confirm dose/schedule in your pharmacy ordering system**  Patient Characteristics: Post-Cystectomy without Neoadjuvant Therapy (Pathologic Staging), pT0-2, pN0, M0 Therapeutic Status: Post-Cystectomy without Neoadjuvant Therapy (Pathologic Staging) AJCC M Category: cM0 AJCC 8 Stage Grouping: II AJCC T Category: pT2b AJCC N Category: pN0 Intent of Therapy: Curative Intent, Not Discussed with Patient

## 2024-09-16 NOTE — Progress Notes (Signed)
 " Hematology and Oncology Follow Up Visit  Keith [name] [id number] March 16, 1971 54 y.o. 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  He now has a new cancer.  Again, he has the Lynch syndrome.  Everything is really tight into him have the Lynch syndrome.  He is undergoing routine endoscopy and colonoscopy.  This was done on 09/04/2024.  He is found to have a polyp in the colon.  This was biopsied.  The pathology report ([id number]) showed an invasive adenocarcinoma.  This had mucinous features.  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  There is no obvious evidence of lymphovascular invasion.  Margins were thought to be negative.  He did have a CT scan that was done on 08/11/2024.  This was negative for any metastatic disease.  Again, he has a Lynch syndrome.  This is fourth malignancy.  His CEA level is 6.  He really has had no symptoms.  There is been no bleeding.  He has had no change in bowel or bladder habits.  He has had no abdominal pain.  He is exercising.  He is working.  Overall, I would have said that his performance status is probably ECOG 0.     Medications:  Allergies as of 09/15/2024   No Known Allergies      Medication List        Accurate as of September 15, 2024 11:59 PM. If you have any questions, ask your nurse or doctor.           acetaminophen  325 MG tablet Commonly known as: TYLENOL  Take 650 mg by mouth every 6 (six) hours as needed for headache.   carboxymethylcellulose 0.5 % Soln Commonly known as: REFRESH PLUS Place 1 drop into both eyes 3 (three) times daily as needed (dry/irritated eyes.).   co-enzyme Q-10 30 MG capsule Take 30 mg by mouth as needed. 08/07/2020 Takes 2 times/weekly.   telmisartan  40 MG tablet Commonly known as: MICARDIS  Take 1 tablet (40 mg total) by mouth daily.   VITAMIN B-12 PO Take by mouth daily.   VITAMIN D  (ERGOCALCIFEROL ) PO Take by mouth daily. 01/19/2022 Patient does not know dose.        Allergies: No Known Allergies  Past Medical History, Surgical history, Social history, and Family History were reviewed and updated.  Review of Systems: Review of Systems  Constitutional:  Positive for malaise/fatigue.  HENT: Negative.    Eyes: Negative.   Respiratory: Negative.    Cardiovascular: Negative.   Gastrointestinal: Negative.   Genitourinary: Negative.   Musculoskeletal: Negative.   Skin:  Positive for rash.  Neurological: Negative.   Endo/Heme/Allergies: Negative.   Psychiatric/Behavioral: Negative.       Physical Exam:  height is 5' 6 (1.676 m) and weight is 124 lb (56.2 kg). His oral temperature is 98.2  F (36.8 C). His blood pressure is 126/86 and his pulse is 67. His respiration is 18 and oxygen saturation is 100%.   Wt Readings from Last 3 Encounters:  09/15/24 124 lb (56.2 kg)  09/04/24 126 lb 3.2 oz (57.2 kg)  08/23/24 126 lb 3.2 oz (57.2 kg)    Physical Exam Vitals reviewed.  HENT:     Head: Normocephalic and atraumatic.  Eyes:     Pupils: Pupils are equal, round, and reactive to light.  Cardiovascular:     Rate and Rhythm: Normal rate and regular rhythm.     Heart sounds: Normal heart sounds.  Pulmonary:     Effort: Pulmonary effort is normal.     Breath sounds: Normal breath sounds.  Abdominal:     General: Bowel sounds are  normal.     Palpations: Abdomen is soft.     Comments: Abdominal exam shows a well-healed laparotomy scar.  He has no masses.  There is no fluid wave.  There is no palpable hepatosplenomegaly.  Musculoskeletal:        General: No tenderness or deformity. Normal range of motion.     Cervical back: Normal range of motion.  Lymphadenopathy:     Cervical: No cervical adenopathy.  Skin:    General: Skin is warm and dry.     Findings: No erythema or rash.     Comments: In the right suprapubic area, there is a nodular dark lesion.  It probably measures about 3 x 3 mm.  It is nontender.  Neurological:     Mental Status: He is alert and oriented to person, place, and time.  Psychiatric:        Behavior: Behavior normal.        Thought Content: Thought content normal.        Judgment: Judgment normal.      Lab Results  Component Value Date   WBC 4.9 09/15/2024   HGB 12.3 (L) 09/15/2024   HCT 36.4 (L) 09/15/2024   MCV 90.8 09/15/2024   PLT 127 (L) 09/15/2024   Lab Results  Component Value Date   FERRITIN 37 08/18/2024   IRON 95 08/18/2024   TIBC 455 (H) 08/18/2024   UIBC 360 08/18/2024   IRONPCTSAT 21 08/18/2024   Lab Results  Component Value Date   RETICCTPCT 1.6 05/22/2021   RBC 4.01 (L) 09/15/2024   No results found for: KPAFRELGTCHN, LAMBDASER, KAPLAMBRATIO No results found for: IGGSERUM, IGA, IGMSERUM No results found for: STEPHANY CARLOTA BENSON MARKEL EARLA JOANNIE DOC VICK, SPEI   Chemistry      Component Value Date/Time   NA 137 09/15/2024 1343   NA 147 (H) 08/06/2017 0816   NA 144 02/04/2017 0758   K 4.3 09/15/2024 1343   K 4.6 08/06/2017 0816   K 4.6 02/04/2017 0758   CL 105 09/15/2024 1343   CL 104 08/06/2017 0816   CO2 22 09/15/2024 1343   CO2 30 08/06/2017 0816   CO2 25 02/04/2017 0758   BUN 11 09/15/2024 1343   BUN 12 08/06/2017 0816   BUN 14.2 02/04/2017 0758   CREATININE 1.55 (H) 09/15/2024 1343    CREATININE 0.9 08/06/2017 0816   CREATININE 0.9 02/04/2017 0758      Component Value Date/Time   CALCIUM 8.4 (L) 09/15/2024 1343   CALCIUM 9.0 08/06/2017 0816   CALCIUM 8.9 02/04/2017 0758   ALKPHOS 116 09/15/2024 1343   ALKPHOS 108 (H) 08/06/2017 0816   ALKPHOS 113 02/04/2017 0758  AST 26 09/15/2024 1343   AST 24 02/04/2017 0758   ALT 18 09/15/2024 1343   ALT 27 08/06/2017 0816   ALT 20 02/04/2017 0758   BILITOT 0.7 09/15/2024 1343   BILITOT 0.91 02/04/2017 0758       Impression and Plan: Keith [name] is 54 yo Chinese gentleman returns for a follow up for history of multiple maglinancies in the setting of Lynch syndrome.  Now, he has a fourth malignancy.  I really do not see that he is going to need surgery.  Given that this is the large syndrome, we will go ahead and treat him with immunotherapy.  I will probably use dostarlimab.  I think this is very useful for colorectal cancer.  Again, this is a very small.  I really do not think that he needs a PET scan.  I will have to see about getting him set up for treatment in the next week or so.  Again, I do not see that we need to do any additional surgery on him.  He comes in with his wife.  I had a long talk with both of them.  This is a whole new problem.  Thankfully, we can use immunotherapy and I will put him through surgery or chemotherapy/radiation therapy.  We will try to get things started in a couple weeks.  I will plan to see him back when he has a second cycle of treatment.  After 4 cycles, I probably would set him up with another flexible sigmoidoscopy.   Maude JONELLE Crease, MD 1/10/20266:25 AM "

## 2024-09-17 ENCOUNTER — Other Ambulatory Visit: Payer: Self-pay

## 2024-09-18 ENCOUNTER — Telehealth: Payer: Self-pay

## 2024-09-18 ENCOUNTER — Encounter: Payer: Self-pay | Admitting: Hematology & Oncology

## 2024-09-18 NOTE — Telephone Encounter (Signed)
-----   Message from [place], [place] sent at 09/18/2024  8:04 AM EST ----- Can you please contact this patient to let him know that per Dr. Timmy, we can hold off on his flexible sigmoidoscopy that was scheduled for later this week.  Thanks. ----- Message ----- From: Timmy Maude SAUNDERS, MD Sent: 09/15/2024   3:01 PM EST To: Bernarda Ned, MD; Sandor San GAILS, DO  I just want to let you know that we are going to try immunotherapy on Keith [name].  This really should work since he has the [employer] syndrome.  I think he has an appointment with Bernarda next Tuesday.  He was wondering if he could cancel this for right now.  I really do not see that he is going to need surgery.  I think he supposed to have a flex sig with Vito.  Again I am not sure if he really is going to need this.  I feel confident that the immunotherapy will get him into remission.   Thanks for all of your help.   Jeralyn

## 2024-09-18 NOTE — Telephone Encounter (Signed)
 Patient aware that per Dr Timmy we will hold off on flexible sigmoidoscopy at this time.  Patient aware that Dr Timmy will make decision as to whether or not flexible sigmoidoscopy is needed after immunotherapy has been completed.  Patient agreed to plan and verbalized understanding.  No further questions or concerns.

## 2024-09-18 NOTE — Telephone Encounter (Signed)
 Patient called requesting a call to discuss concerns he has regarding cancelling appointment. Please advise, thank you

## 2024-09-18 NOTE — Telephone Encounter (Signed)
 Spoke with patient who had questions regarding hemostatic clips that were placed during last colonoscopy in December.  Patient was concerned that he would need to have these surgically removed.  Patient aware that they do not need to be surgically removed and would remain in take for about 6 to 8 weeks and eventually fall off and remove themselves out of the body.  Patient agreed to plan and verbalized understanding.  No further questions.

## 2024-09-19 NOTE — Progress Notes (Addendum)
 Pharmacist Chemotherapy Monitoring - Initial Assessment    Anticipated start date: 09/26/24   The following has been reviewed per standard work regarding the patient's treatment regimen: The patient's diagnosis, treatment plan and drug doses, and organ/hematologic function Lab orders and baseline tests specific to treatment regimen  The treatment plan start date, drug sequencing, and pre-medications Prior authorization status  Patient's documented medication list, including drug-drug interaction screen and prescriptions for anti-emetics and supportive care specific to the treatment regimen The drug concentrations, fluid compatibility, administration routes, and timing of the medications to be used The patient's access for treatment and lifetime cumulative dose history, if applicable  The patient's medication allergies and previous infusion related reactions, if applicable   Changes made to treatment plan:  N/A  Follow up needed:  Jemperli  approved from 1.12.2026-2.11.26. new authorization will need to be obtained for doses after that.   Keith [name], [place], 09/19/2024  8:16 AM

## 2024-09-20 ENCOUNTER — Other Ambulatory Visit: Admitting: Gastroenterology

## 2024-09-23 ENCOUNTER — Other Ambulatory Visit: Payer: Self-pay

## 2024-09-26 ENCOUNTER — Inpatient Hospital Stay

## 2024-09-26 VITALS — BP 110/75 | HR 49 | Temp 97.9°F | Resp 18

## 2024-09-26 DIAGNOSIS — C187 Malignant neoplasm of sigmoid colon: Secondary | ICD-10-CM

## 2024-09-26 DIAGNOSIS — Z5112 Encounter for antineoplastic immunotherapy: Secondary | ICD-10-CM | POA: Diagnosis not present

## 2024-09-26 LAB — CBC WITH DIFFERENTIAL (CANCER CENTER ONLY)
Abs Immature Granulocytes: 0.01 K/uL (ref 0.00–0.07)
Basophils Absolute: 0 K/uL (ref 0.0–0.1)
Basophils Relative: 1 %
Eosinophils Absolute: 0.4 K/uL (ref 0.0–0.5)
Eosinophils Relative: 9 %
HCT: 38.5 % — ABNORMAL LOW (ref 39.0–52.0)
Hemoglobin: 13.1 g/dL (ref 13.0–17.0)
Immature Granulocytes: 0 %
Lymphocytes Relative: 30 %
Lymphs Abs: 1.2 K/uL (ref 0.7–4.0)
MCH: 30.7 pg (ref 26.0–34.0)
MCHC: 34 g/dL (ref 30.0–36.0)
MCV: 90.2 fL (ref 80.0–100.0)
Monocytes Absolute: 0.3 K/uL (ref 0.1–1.0)
Monocytes Relative: 7 %
Neutro Abs: 2.2 K/uL (ref 1.7–7.7)
Neutrophils Relative %: 53 %
Platelet Count: 114 K/uL — ABNORMAL LOW (ref 150–400)
RBC: 4.27 MIL/uL (ref 4.22–5.81)
RDW: 13.2 % (ref 11.5–15.5)
WBC Count: 4.1 K/uL (ref 4.0–10.5)
nRBC: 0 % (ref 0.0–0.2)

## 2024-09-26 LAB — CMP (CANCER CENTER ONLY)
ALT: 19 U/L (ref 0–44)
AST: 24 U/L (ref 15–41)
Albumin: 4.1 g/dL (ref 3.5–5.0)
Alkaline Phosphatase: 119 U/L (ref 38–126)
Anion gap: 11 (ref 5–15)
BUN: 13 mg/dL (ref 6–20)
CO2: 21 mmol/L — ABNORMAL LOW (ref 22–32)
Calcium: 8.8 mg/dL — ABNORMAL LOW (ref 8.9–10.3)
Chloride: 108 mmol/L (ref 98–111)
Creatinine: 1.56 mg/dL — ABNORMAL HIGH (ref 0.61–1.24)
GFR, Estimated: 52 mL/min — ABNORMAL LOW
Glucose, Bld: 164 mg/dL — ABNORMAL HIGH (ref 70–99)
Potassium: 4.1 mmol/L (ref 3.5–5.1)
Sodium: 140 mmol/L (ref 135–145)
Total Bilirubin: 0.8 mg/dL (ref 0.0–1.2)
Total Protein: 6.5 g/dL (ref 6.5–8.1)

## 2024-09-26 LAB — TSH: TSH: 1.83 u[IU]/mL (ref 0.350–4.500)

## 2024-09-26 MED ORDER — ONDANSETRON HCL 8 MG PO TABS: 8.0000 mg | ORAL_TABLET | Freq: Three times a day (TID) | ORAL | 1 refills | Status: AC | PRN

## 2024-09-26 MED ORDER — LIDOCAINE-PRILOCAINE 2.5-2.5 % EX CREA: TOPICAL_CREAM | CUTANEOUS | 3 refills | Status: AC

## 2024-09-26 MED ORDER — SODIUM CHLORIDE 0.9 % IV SOLN: INTRAVENOUS | Status: DC

## 2024-09-26 MED ORDER — PROCHLORPERAZINE MALEATE 10 MG PO TABS: 10.0000 mg | ORAL_TABLET | Freq: Four times a day (QID) | ORAL | 1 refills | Status: AC | PRN

## 2024-09-26 MED ORDER — SODIUM CHLORIDE 0.9 % IV SOLN
500.0000 mg | Freq: Once | INTRAVENOUS | Status: AC
  Administered 2024-09-26: 500 mg via INTRAVENOUS
  Filled 2024-09-26: qty 10

## 2024-09-26 NOTE — Progress Notes (Signed)
 MD reviewed cbc and cmet, VO Ok to treat despite counts.

## 2024-09-26 NOTE — Patient Instructions (Signed)
 Dostarlimab  Injection What is this medication? DOSTARLIMAB  (dos tar li mab) treats some types of cancer. It works by helping your immune system slow or stop the spread of cancer cells. It is a monoclonal antibody. This medicine may be used for other purposes; ask your health care provider or pharmacist if you have questions. COMMON BRAND NAME(S): Jemperli  What should I tell my care team before I take this medication? They need to know if you have any of these conditions: Allogeneic stem cell transplant (uses someone else's stem cells) Autoimmune diseases, such as Crohn disease, ulcerative colitis, lupus History of chest radiation Nervous system problems, such as Guillain-Barre syndrome, myasthenia gravis Organ transplant An unusual or allergic reaction to dostarlimab , other medications, foods, dyes, or preservatives Pregnant or trying to get pregnant Breast-feeding How should I use this medication? This medication is injected into a vein. It is given by your care team in a hospital or clinic setting. A special MedGuide will be given to you before each treatment. Be sure to read this information carefully each time. Talk to your care team about the use of this medication in children. Special care may be needed. Overdosage: If you think you have taken too much of this medicine contact a poison control center or emergency room at once. NOTE: This medicine is only for you. Do not share this medicine with others. What if I miss a dose? Keep appointments for follow-up doses. It is important not to miss your dose. Call your care team if you are unable to keep an appointment. What may interact with this medication? Interactions have not been studied. This list may not describe all possible interactions. Give your health care provider a list of all the medicines, herbs, non-prescription drugs, or dietary supplements you use. Also tell them if you smoke, drink alcohol , or use illegal drugs. Some items  may interact with your medicine. What should I watch for while using this medication? Your condition will be monitored carefully while you are receiving this medication. You may need blood work while taking this medication. This medication may cause serious skin reactions. They can happen weeks to months after starting the medication. Contact your care team right away if you notice fevers or flu-like symptoms with a rash. The rash may be red or purple and then turn into blisters or peeling of the skin. You may also notice a red rash with swelling of the face, lips, or lymph nodes in your neck or under your arms. Tell your care team right away if you have any change in your eyesight. Talk to your care team if you may be pregnant. Serious birth defects can occur if you take this medication during pregnancy and for 4 months after the last dose. You will need a negative pregnancy test before starting this medication. Contraception is recommended while taking this medication and for 4 months after the last dose. Your care team can help you find the option that works for you. Do not breastfeed while taking this medication and for 4 months after the last dose. What side effects may I notice from receiving this medication? Side effects that you should report to your care team as soon as possible: Allergic reactions--skin rash, itching, hives, swelling of the face, lips, tongue, or throat Dry cough, shortness of breath or trouble breathing Eye pain, redness, irritation, or discharge with blurry or decreased vision Heart muscle inflammation--unusual weakness or fatigue, shortness of breath, chest pain, fast or irregular heartbeat, dizziness, swelling of the  ankles, feet, or hands Hormone gland problems--headache, sensitivity to light, unusual weakness or fatigue, dizziness, fast or irregular heartbeat, increased sensitivity to cold or heat, excessive sweating, constipation, hair loss, increased thirst or amount  of urine, tremors or shaking, irritability Infusion reactions--chest pain, shortness of breath or trouble breathing, feeling faint or lightheaded Kidney injury (glomerulonephritis)--decrease in the amount of urine, red or dark brown urine, foamy or bubbly urine, swelling of the ankles, hands, or feet Liver injury--right upper belly pain, loss of appetite, nausea, light-colored stool, dark yellow or brown urine, yellowing skin or eyes, unusual weakness or fatigue Pain, tingling, or numbness in the hands or feet, muscle weakness, change in vision, confusion or trouble speaking, loss of balance or coordination, trouble walking, seizures Rash, fever, and swollen lymph nodes Redness, blistering, peeling, or loosening of the skin, including inside the mouth Sudden or severe stomach pain, bloody diarrhea, fever, nausea, vomiting Side effects that usually do not require medical attention (report these to your care team if they continue or are bothersome): Bone, joint, or muscle pain Diarrhea Fatigue Loss of appetite Nausea Skin rash This list may not describe all possible side effects. Call your doctor for medical advice about side effects. You may report side effects to FDA at 1-800-FDA-1088. Where should I keep my medication? This medication is given in a hospital or clinic. It will not be stored at home. NOTE: This sheet is a summary. It may not cover all possible information. If you have questions about this medicine, talk to your doctor, pharmacist, or health care provider.  2024 [employer] (2022-01-09 00:00:00)

## 2024-09-27 LAB — T4: T4, Total: 6.8 ug/dL (ref 4.5–12.0)

## 2024-10-18 ENCOUNTER — Inpatient Hospital Stay: Attending: Hematology & Oncology | Admitting: Medical Oncology

## 2024-10-18 ENCOUNTER — Inpatient Hospital Stay

## 2024-11-08 ENCOUNTER — Inpatient Hospital Stay: Attending: Hematology & Oncology | Admitting: Hematology & Oncology

## 2024-11-08 ENCOUNTER — Inpatient Hospital Stay

## 2024-11-29 ENCOUNTER — Inpatient Hospital Stay: Admitting: Hematology & Oncology

## 2024-11-29 ENCOUNTER — Inpatient Hospital Stay

## 2025-02-16 ENCOUNTER — Inpatient Hospital Stay: Admitting: Hematology & Oncology

## 2025-02-16 ENCOUNTER — Inpatient Hospital Stay: Attending: Hematology & Oncology
